# Patient Record
Sex: Female | Born: 1935 | Race: White | Hispanic: No | Smoking: Never smoker
Health system: Southern US, Community
[De-identification: ages and names within clinical notes are randomized; demographics above are authoritative.]

## PROBLEM LIST (undated history)

## (undated) DIAGNOSIS — Z66 Do not resuscitate: Secondary | ICD-10-CM

## (undated) DIAGNOSIS — R1310 Dysphagia, unspecified: Secondary | ICD-10-CM

## (undated) DIAGNOSIS — K219 Gastro-esophageal reflux disease without esophagitis: Secondary | ICD-10-CM

## (undated) DIAGNOSIS — R569 Unspecified convulsions: Secondary | ICD-10-CM

## (undated) DIAGNOSIS — G8929 Other chronic pain: Secondary | ICD-10-CM

## (undated) DIAGNOSIS — M810 Age-related osteoporosis without current pathological fracture: Secondary | ICD-10-CM

## (undated) DIAGNOSIS — Z22322 Carrier or suspected carrier of Methicillin resistant Staphylococcus aureus: Secondary | ICD-10-CM

## (undated) DIAGNOSIS — F32A Depression, unspecified: Secondary | ICD-10-CM

## (undated) DIAGNOSIS — I10 Essential (primary) hypertension: Secondary | ICD-10-CM

## (undated) DIAGNOSIS — J9690 Respiratory failure, unspecified, unspecified whether with hypoxia or hypercapnia: Secondary | ICD-10-CM

## (undated) DIAGNOSIS — J690 Pneumonitis due to inhalation of food and vomit: Secondary | ICD-10-CM

## (undated) DIAGNOSIS — Z8522 Personal history of malignant neoplasm of nasal cavities, middle ear, and accessory sinuses: Secondary | ICD-10-CM

## (undated) DIAGNOSIS — M4850XA Collapsed vertebra, not elsewhere classified, site unspecified, initial encounter for fracture: Secondary | ICD-10-CM

## (undated) DIAGNOSIS — E039 Hypothyroidism, unspecified: Secondary | ICD-10-CM

## (undated) DIAGNOSIS — F329 Major depressive disorder, single episode, unspecified: Secondary | ICD-10-CM

## (undated) HISTORY — PX: CHOLECYSTECTOMY: SHX55

## (undated) HISTORY — PX: APPENDECTOMY: SHX54

## (undated) HISTORY — DX: Gastro-esophageal reflux disease without esophagitis: K21.9

## (undated) HISTORY — PX: KYPHOPLASTY: SHX5884

---

## 1969-01-18 DIAGNOSIS — Z8522 Personal history of malignant neoplasm of nasal cavities, middle ear, and accessory sinuses: Secondary | ICD-10-CM

## 1969-01-18 HISTORY — DX: Personal history of malignant neoplasm of nasal cavities, middle ear, and accessory sinuses: Z85.22

## 1969-01-18 HISTORY — PX: PALATE SURGERY: SHX729

## 1969-01-18 HISTORY — PX: ENUCLEATION: SHX628

## 1997-12-26 ENCOUNTER — Ambulatory Visit (HOSPITAL_BASED_OUTPATIENT_CLINIC_OR_DEPARTMENT_OTHER): Admission: RE | Admit: 1997-12-26 | Discharge: 1997-12-26 | Payer: Self-pay

## 2001-02-04 ENCOUNTER — Ambulatory Visit (HOSPITAL_COMMUNITY): Admission: RE | Admit: 2001-02-04 | Discharge: 2001-02-04 | Payer: Self-pay | Admitting: Internal Medicine

## 2001-02-04 ENCOUNTER — Encounter: Payer: Self-pay | Admitting: Internal Medicine

## 2001-02-16 ENCOUNTER — Ambulatory Visit (HOSPITAL_COMMUNITY): Admission: RE | Admit: 2001-02-16 | Discharge: 2001-02-16 | Payer: Self-pay | Admitting: Internal Medicine

## 2001-04-13 ENCOUNTER — Ambulatory Visit (HOSPITAL_COMMUNITY): Admission: RE | Admit: 2001-04-13 | Discharge: 2001-04-14 | Payer: Self-pay | Admitting: Family Medicine

## 2001-04-15 ENCOUNTER — Encounter: Payer: Self-pay | Admitting: Family Medicine

## 2001-04-17 ENCOUNTER — Encounter: Payer: Self-pay | Admitting: Family Medicine

## 2001-04-17 ENCOUNTER — Ambulatory Visit (HOSPITAL_COMMUNITY): Admission: RE | Admit: 2001-04-17 | Discharge: 2001-04-17 | Payer: Self-pay | Admitting: *Deleted

## 2001-11-25 ENCOUNTER — Ambulatory Visit (HOSPITAL_COMMUNITY): Admission: RE | Admit: 2001-11-25 | Discharge: 2001-11-25 | Payer: Self-pay | Admitting: Otolaryngology

## 2001-11-25 ENCOUNTER — Encounter: Payer: Self-pay | Admitting: Otolaryngology

## 2002-01-22 ENCOUNTER — Encounter: Payer: Self-pay | Admitting: Family Medicine

## 2002-01-22 ENCOUNTER — Ambulatory Visit (HOSPITAL_COMMUNITY): Admission: RE | Admit: 2002-01-22 | Discharge: 2002-01-22 | Payer: Self-pay | Admitting: Family Medicine

## 2002-02-04 ENCOUNTER — Ambulatory Visit (HOSPITAL_COMMUNITY): Admission: RE | Admit: 2002-02-04 | Discharge: 2002-02-04 | Payer: Self-pay | Admitting: Family Medicine

## 2002-02-04 ENCOUNTER — Encounter: Payer: Self-pay | Admitting: Family Medicine

## 2002-04-27 ENCOUNTER — Encounter: Payer: Self-pay | Admitting: Family Medicine

## 2002-04-27 ENCOUNTER — Ambulatory Visit (HOSPITAL_COMMUNITY): Admission: RE | Admit: 2002-04-27 | Discharge: 2002-04-27 | Payer: Self-pay | Admitting: Family Medicine

## 2002-09-25 ENCOUNTER — Encounter: Payer: Self-pay | Admitting: *Deleted

## 2002-09-25 ENCOUNTER — Emergency Department (HOSPITAL_COMMUNITY): Admission: EM | Admit: 2002-09-25 | Discharge: 2002-09-25 | Payer: Self-pay | Admitting: *Deleted

## 2002-09-26 ENCOUNTER — Inpatient Hospital Stay (HOSPITAL_COMMUNITY): Admission: AD | Admit: 2002-09-26 | Discharge: 2002-10-07 | Payer: Self-pay | Admitting: Family Medicine

## 2002-09-26 ENCOUNTER — Encounter: Payer: Self-pay | Admitting: *Deleted

## 2002-09-26 ENCOUNTER — Emergency Department (HOSPITAL_COMMUNITY): Admission: EM | Admit: 2002-09-26 | Discharge: 2002-09-26 | Payer: Self-pay | Admitting: Emergency Medicine

## 2002-09-27 ENCOUNTER — Encounter: Payer: Self-pay | Admitting: Family Medicine

## 2002-09-29 ENCOUNTER — Encounter: Payer: Self-pay | Admitting: Neurosurgery

## 2002-10-01 ENCOUNTER — Encounter: Payer: Self-pay | Admitting: Neurosurgery

## 2002-10-07 ENCOUNTER — Inpatient Hospital Stay (HOSPITAL_COMMUNITY)
Admission: RE | Admit: 2002-10-07 | Discharge: 2002-10-18 | Payer: Self-pay | Admitting: Physical Medicine & Rehabilitation

## 2003-07-20 ENCOUNTER — Emergency Department (HOSPITAL_COMMUNITY): Admission: EM | Admit: 2003-07-20 | Discharge: 2003-07-21 | Payer: Self-pay | Admitting: Physical Therapy

## 2003-07-22 ENCOUNTER — Ambulatory Visit (HOSPITAL_COMMUNITY): Admission: RE | Admit: 2003-07-22 | Discharge: 2003-07-22 | Payer: Self-pay | Admitting: Neurosurgery

## 2003-07-28 ENCOUNTER — Inpatient Hospital Stay (HOSPITAL_COMMUNITY): Admission: AD | Admit: 2003-07-28 | Discharge: 2003-08-02 | Payer: Self-pay | Admitting: Neurosurgery

## 2003-08-01 ENCOUNTER — Encounter (INDEPENDENT_AMBULATORY_CARE_PROVIDER_SITE_OTHER): Payer: Self-pay | Admitting: Specialist

## 2003-08-02 ENCOUNTER — Inpatient Hospital Stay (HOSPITAL_COMMUNITY)
Admission: RE | Admit: 2003-08-02 | Discharge: 2003-08-16 | Payer: Self-pay | Admitting: Physical Medicine & Rehabilitation

## 2003-08-16 ENCOUNTER — Inpatient Hospital Stay: Admission: AD | Admit: 2003-08-16 | Discharge: 2003-09-10 | Payer: Self-pay | Admitting: Family Medicine

## 2003-10-31 ENCOUNTER — Inpatient Hospital Stay (HOSPITAL_COMMUNITY): Admission: AD | Admit: 2003-10-31 | Discharge: 2003-11-07 | Payer: Self-pay | Admitting: Neurosurgery

## 2003-11-01 ENCOUNTER — Encounter (INDEPENDENT_AMBULATORY_CARE_PROVIDER_SITE_OTHER): Payer: Self-pay | Admitting: Specialist

## 2003-11-07 ENCOUNTER — Inpatient Hospital Stay: Admission: AD | Admit: 2003-11-07 | Discharge: 2003-12-22 | Payer: Self-pay | Admitting: Pulmonary Disease

## 2003-11-15 ENCOUNTER — Ambulatory Visit (HOSPITAL_COMMUNITY): Admission: RE | Admit: 2003-11-15 | Discharge: 2003-11-15 | Payer: Self-pay | Admitting: Pulmonary Disease

## 2003-12-30 ENCOUNTER — Inpatient Hospital Stay (HOSPITAL_COMMUNITY): Admission: EM | Admit: 2003-12-30 | Discharge: 2004-01-02 | Payer: Self-pay | Admitting: Emergency Medicine

## 2004-01-02 ENCOUNTER — Inpatient Hospital Stay: Admission: AD | Admit: 2004-01-02 | Discharge: 2004-02-18 | Payer: Self-pay | Admitting: Pulmonary Disease

## 2004-02-18 ENCOUNTER — Inpatient Hospital Stay: Admission: AD | Admit: 2004-02-18 | Discharge: 2008-01-13 | Payer: Self-pay | Admitting: Pulmonary Disease

## 2005-07-17 ENCOUNTER — Ambulatory Visit (HOSPITAL_COMMUNITY): Admission: RE | Admit: 2005-07-17 | Discharge: 2005-07-17 | Payer: Self-pay | Admitting: Pulmonary Disease

## 2005-12-13 ENCOUNTER — Emergency Department (HOSPITAL_COMMUNITY): Admission: EM | Admit: 2005-12-13 | Discharge: 2005-12-13 | Payer: Self-pay | Admitting: Emergency Medicine

## 2006-02-18 ENCOUNTER — Ambulatory Visit (HOSPITAL_COMMUNITY): Admission: RE | Admit: 2006-02-18 | Discharge: 2006-02-18 | Payer: Self-pay | Admitting: Pulmonary Disease

## 2008-01-12 ENCOUNTER — Ambulatory Visit (HOSPITAL_COMMUNITY): Admission: RE | Admit: 2008-01-12 | Discharge: 2008-01-12 | Payer: Self-pay | Admitting: Internal Medicine

## 2008-01-13 ENCOUNTER — Inpatient Hospital Stay (HOSPITAL_COMMUNITY): Admission: AD | Admit: 2008-01-13 | Discharge: 2008-01-15 | Payer: Self-pay | Admitting: Pulmonary Disease

## 2008-01-19 ENCOUNTER — Inpatient Hospital Stay: Admission: RE | Admit: 2008-01-19 | Discharge: 2008-02-12 | Payer: Self-pay | Admitting: Internal Medicine

## 2008-02-09 ENCOUNTER — Ambulatory Visit (HOSPITAL_COMMUNITY): Admission: RE | Admit: 2008-02-09 | Discharge: 2008-02-09 | Payer: Self-pay | Admitting: Pulmonary Disease

## 2008-02-10 ENCOUNTER — Encounter (HOSPITAL_COMMUNITY): Admission: RE | Admit: 2008-02-10 | Discharge: 2008-02-15 | Payer: Self-pay | Admitting: Pulmonary Disease

## 2008-02-11 ENCOUNTER — Ambulatory Visit (HOSPITAL_COMMUNITY): Payer: Self-pay | Admitting: Pulmonary Disease

## 2008-02-12 ENCOUNTER — Inpatient Hospital Stay (HOSPITAL_COMMUNITY): Admission: AD | Admit: 2008-02-12 | Discharge: 2008-02-13 | Payer: Self-pay | Admitting: Pulmonary Disease

## 2008-02-15 ENCOUNTER — Inpatient Hospital Stay: Admission: RE | Admit: 2008-02-15 | Discharge: 2008-10-17 | Payer: Self-pay | Admitting: Pulmonary Disease

## 2008-04-25 ENCOUNTER — Ambulatory Visit (HOSPITAL_COMMUNITY): Admission: RE | Admit: 2008-04-25 | Discharge: 2008-04-25 | Payer: Self-pay | Admitting: Pulmonary Disease

## 2008-09-02 ENCOUNTER — Ambulatory Visit (HOSPITAL_COMMUNITY): Admission: RE | Admit: 2008-09-02 | Discharge: 2008-09-02 | Payer: Self-pay | Admitting: Pulmonary Disease

## 2008-10-18 ENCOUNTER — Inpatient Hospital Stay (HOSPITAL_COMMUNITY): Admission: EM | Admit: 2008-10-18 | Discharge: 2008-10-21 | Payer: Self-pay | Admitting: Emergency Medicine

## 2008-10-21 ENCOUNTER — Encounter: Payer: Self-pay | Admitting: Orthopedic Surgery

## 2008-11-04 ENCOUNTER — Ambulatory Visit (HOSPITAL_COMMUNITY): Admission: RE | Admit: 2008-11-04 | Discharge: 2008-11-04 | Payer: Self-pay | Admitting: Pulmonary Disease

## 2008-11-11 ENCOUNTER — Ambulatory Visit (HOSPITAL_COMMUNITY): Admission: RE | Admit: 2008-11-11 | Discharge: 2008-11-11 | Payer: Self-pay | Admitting: Pulmonary Disease

## 2009-06-22 ENCOUNTER — Emergency Department (HOSPITAL_COMMUNITY): Admission: EM | Admit: 2009-06-22 | Discharge: 2009-06-22 | Payer: Self-pay | Admitting: Emergency Medicine

## 2010-03-13 ENCOUNTER — Inpatient Hospital Stay (HOSPITAL_COMMUNITY)
Admission: EM | Admit: 2010-03-13 | Discharge: 2010-03-21 | Payer: Self-pay | Source: Home / Self Care | Admitting: Emergency Medicine

## 2010-03-21 ENCOUNTER — Inpatient Hospital Stay: Admission: AD | Admit: 2010-03-21 | Discharge: 2010-03-24 | Payer: Self-pay | Admitting: Pulmonary Disease

## 2010-03-24 ENCOUNTER — Inpatient Hospital Stay (HOSPITAL_COMMUNITY)
Admission: EM | Admit: 2010-03-24 | Discharge: 2010-03-29 | Payer: Self-pay | Source: Home / Self Care | Admitting: Emergency Medicine

## 2010-03-29 ENCOUNTER — Inpatient Hospital Stay
Admission: AD | Admit: 2010-03-29 | Discharge: 2012-07-16 | Disposition: A | Discharge status: Nursing Home (Includes ICF) | Payer: Medicare Other | Source: Home / Self Care | Attending: Pulmonary Disease | Admitting: Pulmonary Disease

## 2010-03-29 DIAGNOSIS — R531 Weakness: Secondary | ICD-10-CM

## 2010-06-11 ENCOUNTER — Encounter: Payer: Self-pay | Admitting: Pulmonary Disease

## 2010-06-22 ENCOUNTER — Inpatient Hospital Stay (HOSPITAL_COMMUNITY)
Admit: 2010-06-22 | Discharge: 2010-06-22 | DRG: 556 | Disposition: A | Discharge status: Home / Self Care | Payer: Medicare Other | Source: Skilled Nursing Facility | Attending: Pulmonary Disease | Admitting: Pulmonary Disease

## 2010-06-22 DIAGNOSIS — M79609 Pain in unspecified limb: Secondary | ICD-10-CM | POA: Diagnosis present

## 2010-07-09 ENCOUNTER — Ambulatory Visit (HOSPITAL_COMMUNITY): Payer: Medicare Other | Attending: Pulmonary Disease

## 2010-07-09 DIAGNOSIS — I6350 Cerebral infarction due to unspecified occlusion or stenosis of unspecified cerebral artery: Secondary | ICD-10-CM | POA: Insufficient documentation

## 2010-07-09 DIAGNOSIS — G319 Degenerative disease of nervous system, unspecified: Secondary | ICD-10-CM | POA: Insufficient documentation

## 2010-07-17 NOTE — Progress Notes (Signed)
  NAME:  [name], [name]               ACCOUNT NO.:  [id number]  MEDICAL RECORD NO.:  [id number]           PATIENT TYPE:  O  LOCATION:  XRAY                          FACILITY:  [facility]  PHYSICIAN:  Brissia Delisa L. Juanetta Gosling, M.D.DATE OF BIRTH:  Sep 29, 1922  DATE OF PROCEDURE: DATE OF DISCHARGE:  06/22/2010                                PROGRESS NOTE   [name] is a resident at the [facility].  She has got new problems. First, she has several skin lesions on her left leg and today, she is complaining of being nauseated, vomiting, and diarrhea.  There has been a significant outbreak of norovirus in the community.  She has no other new complaints.  She is in the [facility] with osteoporosis, depression, history of cancer in her eye, and failure to thrive.  She is improved.  PHYSICAL EXAMINATION:  SKIN:  Her exam shows she has some skin lesions on her left leg that may be infected.  It is difficult to be 100% certain. CHEST:  Clear. HEART:  Regular. ABDOMEN:  Soft.  ASSESSMENT:  She has severe osteoporosis with multiple fractures.  She had problems with anxiety and depression.  She has what I think is a mild dementia.  She has skin lesions, which were a problem.  She has a new problem which is a viral illness and my plan will be for her to be on Phenergan as needed, Imodium as needed, clear liquid diet as needed. She is going to continue with everything else.  I am going to add Bactroban cream to her leg.     [name] L. Juanetta Gosling, M.D.     ELH/MEDQ  D:  06/25/2010  T:  06/26/2010  Job:  [id number]  Electronically Signed by Kari Baars M.D. on 07/17/2010 06:09:16 PM

## 2010-07-31 LAB — BASIC METABOLIC PANEL
BUN: 5 mg/dL — ABNORMAL LOW (ref 6–23)
CO2: 32 mEq/L (ref 19–32)
Calcium: 7.7 mg/dL — ABNORMAL LOW (ref 8.4–10.5)
Calcium: 8.4 mg/dL (ref 8.4–10.5)
Chloride: 106 mEq/L (ref 96–112)
Creatinine, Ser: 1.2 mg/dL (ref 0.4–1.2)
GFR calc Af Amer: 60 mL/min — ABNORMAL LOW (ref >60)
GFR calc non Af Amer: 49 mL/min — ABNORMAL LOW (ref >60)
Glucose, Bld: 71 mg/dL (ref 70–99)
Glucose, Bld: 94 mg/dL (ref 70–99)
Potassium: 4.3 mEq/L (ref 3.5–5.1)
Sodium: 141 mEq/L (ref 135–145)
Sodium: 141 mEq/L (ref 135–145)
Sodium: 143 mEq/L (ref 135–145)

## 2010-07-31 LAB — COMPREHENSIVE METABOLIC PANEL
Alkaline Phosphatase: 85 U/L (ref 39–117)
BUN: 17 mg/dL (ref 6–23)
CO2: 37 mEq/L — ABNORMAL HIGH (ref 19–32)
Chloride: 96 mEq/L (ref 96–112)
Glucose, Bld: 152 mg/dL — ABNORMAL HIGH (ref 70–99)
Potassium: 3.2 mEq/L — ABNORMAL LOW (ref 3.5–5.1)
Total Bilirubin: 0.6 mg/dL (ref 0.3–1.2)

## 2010-07-31 LAB — URINALYSIS, ROUTINE W REFLEX MICROSCOPIC
Bilirubin Urine: NEGATIVE
Protein, ur: NEGATIVE mg/dL
Urobilinogen, UA: 0.2 mg/dL (ref 0.0–1.0)

## 2010-07-31 LAB — DIFFERENTIAL
Basophils Absolute: 0.1 10*3/uL (ref 0.0–0.1)
Basophils Relative: 0 % (ref 0–1)
Neutro Abs: 14.8 10*3/uL — ABNORMAL HIGH (ref 1.7–7.7)
Neutrophils Relative %: 72 % (ref 43–77)

## 2010-07-31 LAB — URINE MICROSCOPIC-ADD ON

## 2010-07-31 LAB — CBC
HCT: 34.4 % — ABNORMAL LOW (ref 36.0–46.0)
MCV: 88.6 fL (ref 78.0–100.0)
RBC: 3.88 MIL/uL (ref 3.87–5.11)
WBC: 20.6 10*3/uL — ABNORMAL HIGH (ref 4.0–10.5)

## 2010-07-31 LAB — POCT CARDIAC MARKERS
CKMB, poc: <1 ng/mL — ABNORMAL LOW (ref 1.0–8.0)
Myoglobin, poc: 217 ng/mL (ref 12–200)

## 2010-07-31 LAB — VANCOMYCIN, RANDOM
Vancomycin Rm: 13.8 ug/mL
Vancomycin Rm: 30.3 ug/mL

## 2010-08-01 LAB — BASIC METABOLIC PANEL
BUN: 25 mg/dL — ABNORMAL HIGH (ref 6–23)
CO2: 29 mEq/L (ref 19–32)
CO2: 31 mEq/L (ref 19–32)
Calcium: 8 mg/dL — ABNORMAL LOW (ref 8.4–10.5)
Calcium: 8.1 mg/dL — ABNORMAL LOW (ref 8.4–10.5)
Calcium: 8.5 mg/dL (ref 8.4–10.5)
Chloride: 100 mEq/L (ref 96–112)
Chloride: 107 mEq/L (ref 96–112)
Chloride: 107 mEq/L (ref 96–112)
Creatinine, Ser: 0.86 mg/dL (ref 0.4–1.2)
Creatinine, Ser: 0.91 mg/dL (ref 0.4–1.2)
Creatinine, Ser: 0.96 mg/dL (ref 0.4–1.2)
Creatinine, Ser: 1.25 mg/dL — ABNORMAL HIGH (ref 0.4–1.2)
Creatinine, Ser: 1.5 mg/dL — ABNORMAL HIGH (ref 0.4–1.2)
GFR calc Af Amer: 40 mL/min — ABNORMAL LOW (ref >60)
GFR calc Af Amer: 49 mL/min — ABNORMAL LOW (ref >60)
GFR calc Af Amer: >60 mL/min (ref >60)
GFR calc Af Amer: >60 mL/min (ref >60)
GFR calc non Af Amer: 41 mL/min — ABNORMAL LOW (ref >60)
Glucose, Bld: 82 mg/dL (ref 70–99)
Potassium: 3.5 mEq/L (ref 3.5–5.1)
Sodium: 139 mEq/L (ref 135–145)
Sodium: 141 mEq/L (ref 135–145)
Sodium: 141 mEq/L (ref 135–145)

## 2010-08-01 LAB — CBC
HCT: 37 % (ref 36.0–46.0)
Hemoglobin: 10.4 g/dL — ABNORMAL LOW (ref 12.0–15.0)
Hemoglobin: 9.1 g/dL — ABNORMAL LOW (ref 12.0–15.0)
Hemoglobin: 9.6 g/dL — ABNORMAL LOW (ref 12.0–15.0)
MCH: 29.3 pg (ref 26.0–34.0)
MCH: 29.5 pg (ref 26.0–34.0)
MCH: 29.6 pg (ref 26.0–34.0)
MCV: 88.2 fL (ref 78.0–100.0)
MCV: 88.9 fL (ref 78.0–100.0)
MCV: 89.1 fL (ref 78.0–100.0)
MCV: 89.7 fL (ref 78.0–100.0)
Platelets: 103 10*3/uL — ABNORMAL LOW (ref 150–400)
Platelets: 113 10*3/uL — ABNORMAL LOW (ref 150–400)
Platelets: 136 10*3/uL — ABNORMAL LOW (ref 150–400)
Platelets: 210 10*3/uL (ref 150–400)
RBC: 3.09 MIL/uL — ABNORMAL LOW (ref 3.87–5.11)
RBC: 3.23 MIL/uL — ABNORMAL LOW (ref 3.87–5.11)
RBC: 3.49 MIL/uL — ABNORMAL LOW (ref 3.87–5.11)
RBC: 3.64 MIL/uL — ABNORMAL LOW (ref 3.87–5.11)
RBC: 4.16 MIL/uL (ref 3.87–5.11)
RDW: 12.9 % (ref 11.5–15.5)
WBC: 10.9 10*3/uL — ABNORMAL HIGH (ref 4.0–10.5)
WBC: 17.5 10*3/uL — ABNORMAL HIGH (ref 4.0–10.5)
WBC: 8.9 10*3/uL (ref 4.0–10.5)

## 2010-08-01 LAB — COMPREHENSIVE METABOLIC PANEL
ALT: 15 U/L (ref 0–35)
AST: 30 U/L (ref 0–37)
Albumin: 3.7 g/dL (ref 3.5–5.2)
Alkaline Phosphatase: 89 U/L (ref 39–117)
CO2: 33 mEq/L — ABNORMAL HIGH (ref 19–32)
Chloride: 102 mEq/L (ref 96–112)
Creatinine, Ser: 1.14 mg/dL (ref 0.4–1.2)
GFR calc Af Amer: 55 mL/min — ABNORMAL LOW (ref >60)
GFR calc non Af Amer: 45 mL/min — ABNORMAL LOW (ref >60)
Potassium: 4.2 mEq/L (ref 3.5–5.1)
Sodium: 140 mEq/L (ref 135–145)
Total Bilirubin: 0.1 mg/dL — ABNORMAL LOW (ref 0.3–1.2)

## 2010-08-01 LAB — DIFFERENTIAL
Basophils Absolute: 0 10*3/uL (ref 0.0–0.1)
Basophils Absolute: 0.1 10*3/uL (ref 0.0–0.1)
Basophils Relative: 1 % (ref 0–1)
Eosinophils Absolute: 0 10*3/uL (ref 0.0–0.7)
Eosinophils Absolute: 0.1 10*3/uL (ref 0.0–0.7)
Eosinophils Absolute: 0.3 10*3/uL (ref 0.0–0.7)
Eosinophils Absolute: 0.6 10*3/uL (ref 0.0–0.7)
Eosinophils Relative: 0 % (ref 0–5)
Eosinophils Relative: 2 % (ref 0–5)
Eosinophils Relative: 4 % (ref 0–5)
Eosinophils Relative: 5 % (ref 0–5)
Lymphocytes Relative: 5 % — ABNORMAL LOW (ref 12–46)
Lymphocytes Relative: 7 % — ABNORMAL LOW (ref 12–46)
Lymphs Abs: 0.5 10*3/uL — ABNORMAL LOW (ref 0.7–4.0)
Lymphs Abs: 0.6 10*3/uL — ABNORMAL LOW (ref 0.7–4.0)
Lymphs Abs: 0.8 10*3/uL (ref 0.7–4.0)
Lymphs Abs: 0.8 10*3/uL (ref 0.7–4.0)
Monocytes Absolute: 0.4 10*3/uL (ref 0.1–1.0)
Monocytes Absolute: 0.4 10*3/uL (ref 0.1–1.0)
Monocytes Relative: 5 % (ref 3–12)
Monocytes Relative: 5 % (ref 3–12)
Monocytes Relative: 5 % (ref 3–12)
Neutro Abs: 9.5 10*3/uL — ABNORMAL HIGH (ref 1.7–7.7)
Neutrophils Relative %: 87 % — ABNORMAL HIGH (ref 43–77)
Neutrophils Relative %: 93 % — ABNORMAL HIGH (ref 43–77)

## 2010-08-01 LAB — TROPONIN I: Troponin I: 0.02 ng/mL (ref 0.00–0.06)

## 2010-08-01 LAB — GLUCOSE, CAPILLARY: Glucose-Capillary: 229 mg/dL — ABNORMAL HIGH (ref 70–99)

## 2010-08-01 LAB — CULTURE, BLOOD (ROUTINE X 2)

## 2010-08-01 LAB — VANCOMYCIN, TROUGH: Vancomycin Tr: 12.7 ug/mL (ref 10.0–20.0)

## 2010-08-08 LAB — BASIC METABOLIC PANEL
BUN: 27 mg/dL — ABNORMAL HIGH (ref 6–23)
Calcium: 9.2 mg/dL (ref 8.4–10.5)
GFR calc non Af Amer: 36 mL/min — ABNORMAL LOW (ref >60)
Glucose, Bld: 119 mg/dL — ABNORMAL HIGH (ref 70–99)
Potassium: 4.8 mEq/L (ref 3.5–5.1)

## 2010-08-08 LAB — DIFFERENTIAL
Eosinophils Relative: 7 % — ABNORMAL HIGH (ref 0–5)
Lymphs Abs: 1.2 10*3/uL (ref 0.7–4.0)
Monocytes Relative: 6 % (ref 3–12)
Neutro Abs: 3.8 10*3/uL (ref 1.7–7.7)

## 2010-08-08 LAB — PROTIME-INR: Prothrombin Time: 13.9 seconds (ref 11.6–15.2)

## 2010-08-08 LAB — CBC
HCT: 34.2 % — ABNORMAL LOW (ref 36.0–46.0)
Platelets: 190 10*3/uL (ref 150–400)
RDW: 13.8 % (ref 11.5–15.5)

## 2010-08-09 NOTE — Progress Notes (Signed)
  NAME:  [name], Kaiser NO.:  [id number]  MEDICAL RECORD NO.:  [id number]           PATIENT TYPE:  LOCATION:                                 FACILITY:  PHYSICIAN:  Creasie Lacosse L. Juanetta Gosling, M.D.DATE OF BIRTH:  12-Jul-1922  DATE OF PROCEDURE:  08/04/2010 DATE OF DISCHARGE:                                PROGRESS NOTE   HISTORY OF PRESENT ILLNESS:  [name] is about the same.  She is at the [facility].  She has no new complaints.  Her thyroid was somewhat low, so I have changed her thyroid replacement.  She has no other new complaints.  She has had multiple fractures.  She has had anxiety and depression.  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  She has had some anemia and has had episodes of what I think are probably seizures.  PHYSICAL EXAMINATION:  GENERAL:  She is awake and alert.  She looks pretty comfortable. CHEST:  Clear. HEART:  Regular.  ASSESSMENT:  She is better I think.  PLAN:  To continue with her current treatments.  Replace her thyroid. No other new meds and follow.     [name] L. Juanetta Gosling, M.D.     ELH/MEDQ  D:  08/05/2010  T:  08/05/2010  Job:  [id number]  Electronically Signed by Kari Baars M.D. on 08/09/2010 09:10:57 AM

## 2010-08-27 LAB — CROSSMATCH: Antibody Screen: NEGATIVE

## 2010-08-27 LAB — CBC
HCT: 20.2 % — ABNORMAL LOW (ref 36.0–46.0)
HCT: 30 % — ABNORMAL LOW (ref 36.0–46.0)
Hemoglobin: 10.1 g/dL — ABNORMAL LOW (ref 12.0–15.0)
Hemoglobin: 10.1 g/dL — ABNORMAL LOW (ref 12.0–15.0)
Hemoglobin: 6.9 g/dL — CL (ref 12.0–15.0)
MCHC: 34 g/dL (ref 30.0–36.0)
MCV: 90.4 fL (ref 78.0–100.0)
Platelets: 88 10*3/uL — ABNORMAL LOW (ref 150–400)
RBC: 2.23 MIL/uL — ABNORMAL LOW (ref 3.87–5.11)
RBC: 3.2 MIL/uL — ABNORMAL LOW (ref 3.87–5.11)
WBC: 10.5 10*3/uL (ref 4.0–10.5)
WBC: 8.2 10*3/uL (ref 4.0–10.5)
WBC: 8.5 10*3/uL (ref 4.0–10.5)

## 2010-08-27 LAB — BASIC METABOLIC PANEL
BUN: 24 mg/dL — ABNORMAL HIGH (ref 6–23)
CO2: 23 mEq/L (ref 19–32)
Calcium: 7.8 mg/dL — ABNORMAL LOW (ref 8.4–10.5)
Calcium: 8 mg/dL — ABNORMAL LOW (ref 8.4–10.5)
Chloride: 106 mEq/L (ref 96–112)
Chloride: 109 mEq/L (ref 96–112)
GFR calc Af Amer: 31 mL/min — ABNORMAL LOW (ref >60)
GFR calc Af Amer: 50 mL/min — ABNORMAL LOW (ref >60)
GFR calc non Af Amer: 35 mL/min — ABNORMAL LOW (ref >60)
Potassium: 4.7 mEq/L (ref 3.5–5.1)
Potassium: 5.5 mEq/L — ABNORMAL HIGH (ref 3.5–5.1)
Sodium: 136 mEq/L (ref 135–145)
Sodium: 138 mEq/L (ref 135–145)
Sodium: 140 mEq/L (ref 135–145)

## 2010-08-27 LAB — PROTIME-INR
INR: 1.3 (ref 0.00–1.49)
INR: 2.2 — ABNORMAL HIGH (ref 0.00–1.49)
INR: 2.3 — ABNORMAL HIGH (ref 0.00–1.49)
Prothrombin Time: 26.1 seconds — ABNORMAL HIGH (ref 11.6–15.2)

## 2010-08-27 LAB — HEMOGLOBIN AND HEMATOCRIT, BLOOD: HCT: 27.2 % — ABNORMAL LOW (ref 36.0–46.0)

## 2010-08-27 LAB — PREPARE RBC (CROSSMATCH)

## 2010-08-28 LAB — POCT CARDIAC MARKERS
Myoglobin, poc: 87.3 ng/mL (ref 12–200)
Troponin i, poc: <0.05 ng/mL (ref 0.00–0.09)

## 2010-08-28 LAB — URINE MICROSCOPIC-ADD ON

## 2010-08-28 LAB — URINALYSIS, ROUTINE W REFLEX MICROSCOPIC
Bilirubin Urine: NEGATIVE
Glucose, UA: NEGATIVE mg/dL
Ketones, ur: NEGATIVE mg/dL
Leukocytes, UA: NEGATIVE
Protein, ur: NEGATIVE mg/dL
pH: 5.5 (ref 5.0–8.0)

## 2010-08-28 LAB — CBC
HCT: 31.9 % — ABNORMAL LOW (ref 36.0–46.0)
Hemoglobin: 11.1 g/dL — ABNORMAL LOW (ref 12.0–15.0)
MCV: 90.4 fL (ref 78.0–100.0)
Platelets: 157 10*3/uL (ref 150–400)
RBC: 3.53 MIL/uL — ABNORMAL LOW (ref 3.87–5.11)
WBC: 7.7 10*3/uL (ref 4.0–10.5)

## 2010-08-28 LAB — DIFFERENTIAL
Eosinophils Absolute: 0.2 10*3/uL (ref 0.0–0.7)
Eosinophils Relative: 2 % (ref 0–5)
Lymphocytes Relative: 18 % (ref 12–46)
Lymphs Abs: 1.4 10*3/uL (ref 0.7–4.0)
Monocytes Absolute: 0.4 10*3/uL (ref 0.1–1.0)
Monocytes Relative: 6 % (ref 3–12)

## 2010-08-28 LAB — BASIC METABOLIC PANEL
BUN: 33 mg/dL — ABNORMAL HIGH (ref 6–23)
Chloride: 108 mEq/L (ref 96–112)
GFR calc non Af Amer: 37 mL/min — ABNORMAL LOW (ref >60)
Potassium: 4.5 mEq/L (ref 3.5–5.1)
Sodium: 140 mEq/L (ref 135–145)

## 2010-09-16 NOTE — Group Therapy Note (Signed)
  NAME:  PEPPER, KERRICK               ACCOUNT NO.:  [id number]  MEDICAL RECORD NO.:  [id number]           PATIENT TYPE:  I  LOCATION:  S126                          FACILITY:  [facility]  PHYSICIAN:  Markita Stcharles L. Juanetta Gosling, M.D.DATE OF BIRTH:  1923/02/01  DATE OF PROCEDURE: DATE OF DISCHARGE:                                PROGRESS NOTE   Ms. Costanza is having some more problems.  She has had some cough and congestion.  She is on oxygen.  She is trying to keep herself off of it. Her hemoglobin level is a little bit lower.  Her BMET looks okay.  Her hemoglobin level is about 8.2 and I had her get a anemia profile which showed that she did have some iron deficiency.  We are working that up.  She is going to have stools for blood.  She in addition to that of course she has had multiple bouts of upper respiratory infection.  She has had two episodes that I think are seizures.  She has severe osteoporosis, status post multiple fractures and she has had a enucleation of her left eye because of the tumor in her sinus.  She looks fairly comfortable.  She still looks a little short of breath.  She is wearing her oxygen.  Her heart is regular.  Her abdomen is soft.  ASSESSMENT:  I think she is about the same.  PLAN:  Continue with her current treatments and medications.  No changes today.  We are working up the anemia and will follow from there.     Janesha Brissette L. Juanetta Gosling, M.D.     ELH/MEDQ  D:  08/28/2010  T:  08/28/2010  Job:  [id number]  Electronically Signed by Kari Baars M.D. on 09/16/2010 02:47:52 PM

## 2010-10-02 NOTE — Discharge Summary (Signed)
NAME:  [name], [name]               ACCOUNT NO.:  [id number]   MEDICAL RECORD NO.:  [id number]          PATIENT TYPE:  INP   LOCATION:  A314                          FACILITY:  [facility]   PHYSICIAN:  Edward L. Juanetta Gosling, M.D.DATE OF BIRTH:  21-Jul-1922   DATE OF ADMISSION:  01/13/2008  DATE OF DISCHARGE:  08/28/2009LH                               DISCHARGE SUMMARY   Being transferred to the [facility] today.   FINAL DISCHARGE DIAGNOSES:  1. Hyponatremia.  2. Dehydration.  3. Hypertension.  4. Gastroesophageal reflux disease.  5. 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  6. History of carcinoma of the nasal sinus.  7. History of enucleation of the left eye.  8. Hypothyroidism.  9. Osteoporosis, status post multiple fractures.  10.Anemia, probably of chronic disease.   HISTORY:  [name] is an 77 year old who lives at the [facility].  She  had been doing pretty well.  She had been diagnosed with an MRSA  infection in the sinus about a month ago.  She had been placed on  Bactrim DS by Dr. Lazarus Salines, the ear, nose and throat surgeon who had made  the diagnosis, but she did not tolerate that well.  She had increasing  weakness and she was being switched over to clindamycin but she  continued to be weak.  I had ordered a chest x-ray which did not show  any acute changes, a BMET that showed her sodium was 116, potassium 5.9,  and a CBC that showed she was mildly anemic.  I went to see her at the  [facility] and discussed all her findings with her, I was a bit  concerned that her lab work might not be totally accurate because of  some concern about hemolysis.  I did give her Kayexalate, restricted her  fluids, her sodium came up to 119.  [name] was quite awake and alert  when I saw and did not want to be admitted to the hospital, but the next  morning she agreed that some fluids might help and she wanted to be  admitted.   PHYSICAL EXAMINATION ON ADMISSION:   Showed a well-developed, well-  nourished female in no acute distress.  Her right pupil was reactive.  Left eye was missing.  She had a facial bone prosthesis.  Mucous  membranes are dry.  CHEST:  Clear.  HEART:  Regular.  ABDOMEN:  Soft.  EXTREMITIES:  No edema.  CNS:  Grossly intact.   HOSPITAL COURSE:  She was treated with intravenous fluids, vancomycin.  Her oral fluids were restricted and by the time of discharge she was  found to have a sodium of 129, she felt better, looked better, her BUN  and creatinine had returned to baseline for her so her dehydration had  resolved and she wanted to be returned to the [facility].  I am going  to send her back to the [facility] on oral fluid restriction of 800 mL  per day, on Actonel 35 mg weekly, Colace 100 mg b.i.d., Prilosec 20 mg  daily, metoprolol 25 mg daily, Tylenol  1000 mg p.o. q.4h. p.r.n. pain or  fever, Ativan 0.5 mg at bedtime and q.4h. p.r.n. anxiety, Vistaril 25 mg  p.o. nightly and q.6h. p.r.n. anxiety, Nu-Iron 150 mg daily, and  clindamycin 300 mg p.o. q.6h. x14 days.  She will need to have a CBC and  a BMET done on September 2nd.  She will be on a regular __________and  she will be followed at the [facility].      Edward L. Juanetta Gosling, M.D.  Electronically Signed     ELH/MEDQ  D:  01/15/2008  T:  01/15/2008  Job:  [id number]

## 2010-10-02 NOTE — Group Therapy Note (Signed)
NAME:  [name], [name]               ACCOUNT NO.:  [id number]   MEDICAL RECORD NO.:  [id number]         PATIENT TYPE:  PORB   LOCATION:  S126                          FACILITY:  [facility]   PHYSICIAN:  Edward L. Juanetta Gosling, M.D.DATE OF BIRTH:  09-03-1922   DATE OF PROCEDURE:  12/25/2008  DATE OF DISCHARGE:                                 PROGRESS NOTE   Problems including osteoporosis, depression, status post multiple  fractures, chronic infection, history of a carcinoma of the sinus area  requiring enucleation of her left eye.   SUBJECTIVE:  Ms. Cleek says she is doing well and had no complaints.  She is feeling okay, but she has had a couple of episodes where she slid  out of her chair.  She had lab work recently which is okay.   Her exam shows that she is awake and alert.  Chest is clear.  Heart is  regular.  Abdomen is soft.  Extremities showed no edema.   Assessment is that she got multiple medical problems that seems to be  stable.   PLAN:  No changes in her treatments and I will continue to follow her at  the nursing home.      Edward L. Juanetta Gosling, M.D.  Electronically Signed     ELH/MEDQ  D:  12/25/2008  T:  12/26/2008  Job:  [id number]

## 2010-10-02 NOTE — Discharge Summary (Signed)
NAME:  [name], [name]               ACCOUNT NO.:  [id number]   MEDICAL RECORD NO.:  [id number]          PATIENT TYPE:  INP   LOCATION:  A314                          FACILITY:  [facility]   PHYSICIAN:  Edward L. Juanetta Gosling, M.D.DATE OF BIRTH:  October 31, 1922   DATE OF ADMISSION:  01/13/2008  DATE OF DISCHARGE:  LH                               DISCHARGE SUMMARY   ADDENDUM:  I forgot to put on her discharge diagnoses a diagnosis of depression, a  diagnosis of hypothyroidism, diagnosis of allergies.  Other medications  that I did not dictate; Claritin 10 mg daily, Synthroid 150 mcg daily,  Lexapro 10 mg daily, Os-Cal 500 Plus D three times a day.      Edward L. Juanetta Gosling, M.D.  Electronically Signed     ELH/MEDQ  D:  01/15/2008  T:  01/15/2008  Job:  [id number]

## 2010-10-02 NOTE — Group Therapy Note (Signed)
NAME:  ESHANI, MAESTRE NO.:  [id number]   MEDICAL RECORD NO.:  [id number]          PATIENT TYPE:  ORB   LOCATION:  S126                          FACILITY:  [facility]   PHYSICIAN:  Edward L. Juanetta Gosling, M.D.DATE OF BIRTH:  1923/04/28   DATE OF PROCEDURE:  DATE OF DISCHARGE:                                 PROGRESS NOTE   Ms. Kanaan is doing better.  She has been hospitalized with a fractured  hip, but she seems to have made a good recovery.  She has been on  Coumadin with her Coumadin is scheduled to stop now, and I think, she  seems to be doing okay.  She also has multiple other problems including  a history of cancer which ended up causing her to have enucleation of  her left eye.  She has had MRSA infection of the sinuses.  She has had  bouts of complaints of hyponatremia and anemia, but overall I think she  is better.  She does seem to be more comfortable and she says she feels  better with no new complaints.  I do not plan to change any of her  treatments today.      Edward L. Juanetta Gosling, M.D.  Electronically Signed     ELH/MEDQ  D:  11/21/2008  T:  11/22/2008  Job:  [id number]

## 2010-10-02 NOTE — Group Therapy Note (Signed)
NAME:  QUINCIE, HAROON NO.:  [id number]   MEDICAL RECORD NO.:  [id number]          PATIENT TYPE:  ORB   LOCATION:  S126                          FACILITY:  [facility]   PHYSICIAN:  Edward L. Juanetta Gosling, M.D.DATE OF BIRTH:  11-29-1922   DATE OF PROCEDURE:  01/02/2008  DATE OF DISCHARGE:                                 PROGRESS NOTE   Ms. marieli rudy is doing a bit better.  She says that she feels  better.  She was discovered to have a MRSA infection in her nasal cavity  by Dr. Lazarus Salines, the ENT physician and she is being treated for that.  She says that she does feel somewhat better since she has been started  on this treatment.  Otherwise, she is about the same.  She has no other  new complaints and no other new problems.  Her depression is doing well.  She has no other new problems or complaints.   Her physical examination, she, of course, has an enucleation of her left  eye.  Her nostril appears better.  Her chest is clear.  Her heart is  regular.  She overall appears to be feeling better.   ASSESSMENT:  She is I think improved.  She does have an methicillin-  resistant Staphylococcus aureus infection in her nose that is being  treated.  She has chronic sinus congestion.  She has had an enucleation  of the eye on the left.  She has moderately severe depression, which is  doing okay.  She has chronic pain, on a Duragesic patch and she has  severe osteoporosis.  She has not had any fractures in a period of  greater than two years now, however.      Edward L. Juanetta Gosling, M.D.  Electronically Signed     ELH/MEDQ  D:  01/03/2008  T:  01/03/2008  Job:  [id number]

## 2010-10-02 NOTE — Group Therapy Note (Signed)
NAME:  [name], [name]               ACCOUNT NO.:  [id number]   MEDICAL RECORD NO.:  [id number]          PATIENT TYPE:  INP   LOCATION:  A318                          FACILITY:  [facility]   PHYSICIAN:  Edward L. Juanetta Gosling, M.D.DATE OF BIRTH:  [birth year]   DATE OF PROCEDURE:  02/13/2008  DATE OF DISCHARGE:  02/13/2008                                 PROGRESS NOTE   [name] has much improved and surprisingly to me had essentially no  abnormalities in her lab work.  She did have some white blood cells in  her urine and we are awaiting the culture.  She had a sodium that was  133 with normal 134, her hemoglobin was greater than 11, white blood  count was normal, she has been afebrile, and her vital signs have been  stable.  So I want to plan to send her back to the [facility].  I have  not found a cause for her to have her symptoms.  I will go ahead and  discontinue Namenda, perhaps her confusion is related to that.      Edward L. Juanetta Gosling, M.D.  Electronically Signed     ELH/MEDQ  D:  02/13/2008  T:  02/13/2008  Job:  [id number]

## 2010-10-02 NOTE — Group Therapy Note (Signed)
NAME:  [name], [name] NO.:  [id number]   MEDICAL RECORD NO.:  [id number]          PATIENT TYPE:  ORB   LOCATION:  S126                          FACILITY:  [facility]   PHYSICIAN:  Edward L. Juanetta Gosling, M.D.DATE OF BIRTH:  1923/04/13   DATE OF PROCEDURE:  DATE OF DISCHARGE:                                 PROGRESS NOTE   Ms. Sporrer continues to have more problems.  She actually fell some  several days ago and states she is aching all over.  She had lab work  done yesterday and that showed that her hemoglobin level then was about  8.6, but this morning, it is up in the 9 range.  She has received some  IV fluids.  She has a rash on her abdomen.  I am not sure if some of her  problem has been related to taking the medications for her  Staphylococcal infection, but her platelet count is also down some.  She  has not shown any signs of any sort of bleeding at this point.  We are  still checking stools for blood and we have not been able to demonstrate  any sort of GI bleeding in the past.   Her exam today shows that she looks more uncomfortable, and I have  discussed all this with her at length.  She states she does not want to  go to the hospital.  She wants to stay at the nursing home.  She is on  IV fluids now.  Her hemoglobin levels are little bit better.   ASSESSMENT:  Then, she has anemia.  It is still not totally clear what  has happened to that.  She has also had a problem with her platelets  that are low, that has not been a problem in the past.  She has had  hyponatremia in the past.  Her hyponatremia will be rechecked in the  morning as well.  She has a rash on her abdomen.  I am going to put some  Mycostatin cream on that and this looks like it is an area of fungal  rash.  Of course, her other problems are unchanged.  She has had an  enucleation of her left eye due to a tumor.  She has osteoporosis and  she did not have any evidence of any sort of fracture from her  fall.  She is having methicillin-resistant Staphylococcus aureus infection in  her sinus, and she has been on Xyzal.  It is not totally clear now what  she is having is an infection or colonization or what.  My assessment  then is that she is clearly worse.   PLAN:  Will be to have continue her medications and treatments.  Continue the IV fluids and follow.  Since she is not interested in being  transferred to the hospital, I am going to see what we can do here.  I  have told her that if she does not improve then she may end up having to  go to the hospital.      Ramon Dredge L. Juanetta Gosling, M.D.  Electronically Signed     ELH/MEDQ  D:  04/24/2008  T:  04/24/2008  Job:  [id number]

## 2010-10-02 NOTE — Group Therapy Note (Signed)
NAME:  [name], [name]               ACCOUNT NO.:  [id number]   MEDICAL RECORD NO.:  [id number]          PATIENT TYPE:  INP   LOCATION:  A314                          FACILITY:  [facility]   PHYSICIAN:  Edward L. Juanetta Gosling, M.D.DATE OF BIRTH:  Jan 13, 1923   DATE OF PROCEDURE:  DATE OF DISCHARGE:  01/15/2008                                 PROGRESS NOTE   Ms. Vanhook says she has much improved.  She feels better.  She has no new  complaints.  Her temperature is 97.5, pulse is 77, respirations are 16,  blood pressure 156/75.  She is awake and alert.  Her chest is clear.  Heart is regular.  Her abdomen is soft.  Extremities showed no edema.  Her iron studies done yesterday show iron is 73, binding capacity 359,  she is 20% saturated, which is on the borderline of iron deficiency, so  I am going to put on some iron.  We are going to check stools for blood.  She had one done yesterday that was negative.  She is having another one  done today.  Her BMET this morning shows that her sodium is 129, BUN is  5, creatinine 0.9.  TSH was 3.47, which is within limits and at this  point, she wants to be transferred back to her skilled care facility.  I  think that is okay.  I am going to discuss it with her daughter but she  is awake, she is alert.  She clearly can make her own decisions and I  think being transferred back is not unreasonable.  Please see discharge  summary for details.      Edward L. Juanetta Gosling, M.D.  Electronically Signed     ELH/MEDQ  D:  01/15/2008  T:  01/16/2008  Job:  [id number]

## 2010-10-02 NOTE — H&P (Signed)
NAME:  [name], [name]               ACCOUNT NO.:  [id number]   MEDICAL RECORD NO.:  [id number]          PATIENT TYPE:  INP   LOCATION:  IC01                          FACILITY:  [facility]   PHYSICIAN:  Edward L. Juanetta Gosling, M.D.DATE OF BIRTH:  [birth year]   DATE OF ADMISSION:  10/17/2008  DATE OF DISCHARGE:  06/01/2010LH                              HISTORY & PHYSICAL   HISTORY AND PHYSICAL AND DISCHARGE SUMMARY:   FINAL DISCHARGE DIAGNOSES:  1. Fractured right hip.  2. Hypertension.  3. Gastroesophageal reflux disease.  4. Carcinoma of the nasal sinus requiring enucleation of the left eye.  5. Hypothyroidism.  6. Osteoporosis.  7. Status post multiple compression fractures in the supine next.  8. Depression.   HISTORY:  [name] is an 77 year old who had been in her usual state of  good health when she fell to the floor.  She says that she felt like  something may have popped and then she fell.  She was brought to the  emergency room where she was found to have a fractured right hip.  She  has a very long known history of osteoporosis.  She has been on Forteo  and has finished her 2-year treatment of that.  She also has a number of  other medical problems.  She has had some problems with hyponatremia and  anemia.  Recently, she has had a MRSA infection of her nose.   PAST MEDICAL HISTORY:  1. Hypertension.  2. Gastroesophageal reflux disease.  3. Carcinoma of the nasal sinus which required removal of the hard and      soft palate on the left side and enucleation of the left eye.  4. Hypothyroidism.  5. Osteoporosis.  6. She has had multiple fractures.  7. She has had depression.   ALLERGIES:  SHE IS ALLERGIC TO CIPRO AND DARVOCET AND DOES NOT TOLERATE  BACTRIM.   SOCIAL HISTORY:  She is retired.  She lives at the [facility] for some  time.  She does not smoke, and she does not use any alcohol or illicit  drugs.   FAMILY HISTORY:  Positive for osteoporosis, anxiety, depression  and  asthma.   REVIEW OF SYSTEMS:  Except as mentioned is negative.   PHYSICAL EXAMINATION:  GENERAL:  A well-developed, well-nourished female  who appears to be in some distress due to pain.  VITAL SIGNS:  As recorded in the E-chart.  HEENT:  She has had the enucleation of the left eye.  Her mucous  membranes are moist.  Nose and throat are clear.  She has a prosthesis  that she uses over the eye area.  NECK:  Supple.  CHEST:  Clear without wheezes.  HEART:  Regular without murmur, gallop or rub.  ABDOMEN:  Soft.  No masses are felt.  EXTREMITIES:  No edema.  MUSCULOSKELETAL:  She has some deformity of her back because of the  previous multiple fractures.   LABORATORY DATA:  White count 7700, hemoglobin 11.1, platelets 157.  Cardiac markers are negative.  BMET shows a sodium is 140, potassium  4.5, BUN is 33,  creatinine 1.38.  Urinalysis shows positive nitrite.   ASSESSMENT:  She has a fractured hip.  I have discussed all this with  her family.  They requested she be transferred to Dr. Thurston Hole in  [place], and that will be done later today.      Edward L. Juanetta Gosling, M.D.  Electronically Signed     ELH/MEDQ  D:  10/18/2008  T:  10/18/2008  Job:  [id number]

## 2010-10-02 NOTE — Group Therapy Note (Signed)
NAME:  SUMER, MOOREHOUSE NO.:  [id number]   MEDICAL RECORD NO.:  [id number]          PATIENT TYPE:  ORB   LOCATION:  S126                          FACILITY:  [facility]   PHYSICIAN:  Edward L. Juanetta Gosling, M.D.DATE OF BIRTH:  1922-11-20   DATE OF PROCEDURE:  DATE OF DISCHARGE:                                 PROGRESS NOTE   Ms. Lechtenberg is, I think, better.  She says that she is feeling okay.  We  have been following electrolytes and her hemoglobin level and thus far  they have stabilized to some extent.  She has not had any changes in her  medications and her hemoglobin level although not perfect and is not  changed a great deal.  She has not had any more fractures in the spine.  She did have some problems with some swelling that seems a little bit  better since she had been back on some Lasix, and she has had an MRSA  infection in her nose.  She had recent evaluation by her ENT physician  and that was okay.  I did not change any meds today and I will plan to  follow along.  She will continue to have periodic reevaluation of her  CBC and her electrolytes.      Edward L. Juanetta Gosling, M.D.  Electronically Signed     ELH/MEDQ  D:  07/04/2008  T:  07/05/2008  Job:  [id number]

## 2010-10-02 NOTE — Discharge Summary (Signed)
NAME:  [name], DAIL NO.:  [id number]   MEDICAL RECORD NO.:  [id number]          PATIENT TYPE:  INP   LOCATION:  5016                         FACILITY:  [facility]   PHYSICIAN:  Eulas Post, MD    DATE OF BIRTH:  08-29-1922   DATE OF ADMISSION:  10/18/2008  DATE OF DISCHARGE:  10/21/2008                               DISCHARGE SUMMARY   ADMISSION DIAGNOSES:  Right reverse obliquity intertrochanteric hip  fracture.   DISCHARGE DIAGNOSES:  1. Right reverse obliquity intertrochanteric hip fracture.  2. Acute blood loss anemia.  3. Acute renal insufficiency.  4. Hypertension.  5. Reflux.  6. 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  7. Osteoporosis.  8. Hypothyroidism.  9. Multiple compression fractures of the spine in the past.  10.Depression.  11.History of methicillin-resistant Staphylococcus aureus nasal      infection.   PRIMARY PROCEDURES:  Right hip trochanteric femoral nailing performed  October 18, 2008.   DISCHARGE MEDICATIONS:  Will include Coumadin as well as Vicodin and she  will resume her home medications.   HOSPITAL COURSE:  [name] is an 77 year old woman who fell and  broke her right hip.  She was transferred to [facility] for  management.  She underwent trochanteric femoral nailing.  She tolerated  the procedure well and did not have any complications.  She did have  fairly significant postoperative anemia with a hemoglobin of 6.9.  She  received a total of 3 units of packed red blood cells.  Her hemoglobin  was 10 on the day of discharge.  She was given Coumadin and sequential  compression devices for DVT prophylaxis.  Her INR was 2.2 on the day of  discharge.  Her white count was 8.  She was having some slight increased  congestion and chest x-ray was taken.  This demonstrated possible  atelectasis.  A follow-up chest x-ray was ordered on the day of  discharge which is pending at  this time.  Her creatinine was as high as  1.9, but with fluids this came down to 1.4.  She had a speech evaluation  study done given her risk for aspiration and her congestion and this  indicated that she would do better with a dysphagia II diet as well as a  thick liquid.  She may benefit from a modified barium swallow study that  can be done as an outpatient.  Her creatinine on the day of discharge  was 1.23 and continued to improve.  She was unable to ambulate prior to  this surgery, with basically bed to wheelchair transfers, and she was  making basically the same  type of progress with therapy.  She is planned to be discharged to  skilled nursing facility for ongoing therapy and management.  She will  follow up with me in 2 weeks.  There are no complications and she  benefited maximally from her hospital stay.      Eulas Post, MD  Electronically Signed     JPL/MEDQ  D:  10/21/2008  T:  10/21/2008  Job:  [id number]

## 2010-10-02 NOTE — H&P (Signed)
NAME:  [name], [name]               ACCOUNT NO.:  [id number]   MEDICAL RECORD NO.:  [id number]          PATIENT TYPE:  INP   LOCATION:  A318                          FACILITY:  [facility]   PHYSICIAN:  Edward L. Juanetta Gosling, M.D.DATE OF BIRTH:  1922-06-29   DATE OF ADMISSION:  02/12/2008  DATE OF DISCHARGE:  LH                              HISTORY & PHYSICAL   REASON FOR ADMISSION:  Change in mental status and anemia.   [name] is a 77 year old who has been living at the [facility], but  was hospitalized in August with hyponatremia.  She was somewhat anemic  then, but had negative stool for blood and she was thought to have  mostly anemia of chronic disease, although she was being given iron  replacement as well.  Since then, she has continued to have confusion.  She has had much worse confusion here in the last day or so, and her  hemoglobin level had dropped.  She actually got 2 units of blood  yesterday, but she still had so far from the [facility] has negative  stools for blood.  It is not quite clear what has happened to her  hemoglobin level.  She has also had an MRSA infection of the sinus.  She  has been on multiple meds for that and it was felt that part of the  problem might have been with the  Bactrim that she had been on had made  her sick.  She says she does not want to be hospitalized.  I have told  her that we understand that she is just not getting better.   PAST MEDICAL HISTORY:  1. Positive for hypertension.  2. Gastroesophageal reflux disease.  3. She has had carcinoma of the nasal sinus area, had enucleation of      her left eye as part of surgery for carcinoma, some removal of hard      and soft palate on the left side.  4. Hypothyroidism, was in the hospital for this.   She has had multiple fractures.  She has also had depression.  She had  been anemic recently.   ALLERGIES:  She is allergic to CIPRO, DARVOCET, and obviously did not  tolerate BACTRIM.   MEDICATIONS:  1. At the nursing home, she has been on Actonel 35 mg weekly.  2. Colace 100 mg daily.  3. Prilosec 20 mg daily.  4. Metoprolol 25 mg daily.  5. Tylenol as needed.  6. Ativan 0.5 at bedtime.  She also takes it on a p.r.n. basis.  7. She had been on Zestril with HCTZ for blood pressure, but that had      been discontinued.  8. In the past, she had been on Forteo.  9. She had been on Zoloft.   SOCIAL HISTORY:  She is retired.  She was at [facility].  She does not  smoke.  She does not use any alcohol.  She has a family who lives close  by.   FAMILY HISTORY:  Positive for osteoporosis, anxiety, depression, and  asthma.   REVIEW OF SYSTEMS:  Except as mentioned is negative.   PHYSICAL EXAMINATION:  GENERAL:  Shows a well-developed, well-nourished  female who is anxious, but in no acute distress.  She looks somewhat  pale.  HEENT:  Her pupils were reactive to light and accommodation.  Her nose  and throat are clear.  NECK:  Her neck is supple without masses.  CHEST:  Clear.  HEART:  Regular without gallop.  ABDOMEN:  Soft.  No masses.  EXTREMITIES:  Showed no edema.  CNS:  Shows that she appears to be somewhat depressed and anxious,  otherwise negative.  She had some kyphosis of her lumbar and thoracic  spine.   ASSESSMENT:  She has had a fairly marked change in mental status.  She  had been cursing which is highly a character of  her.  She did develop  low sodium earlier but her sodium level checked earlier this week was  basically normal.  She has been anemic.  We do not have the source of  bleeding.  We are going to go and recheck stools for blood.  She is  going to transfused for, if needed.  I am going to put her IV fluids, to  try to get her overall better.      Edward L. Juanetta Gosling, M.D.  Electronically Signed     ELH/MEDQ  D:  02/12/2008  T:  02/13/2008  Job:  [id number]

## 2010-10-02 NOTE — Group Therapy Note (Signed)
NAME:  LALITA, EBEL NO.:  [id number]   MEDICAL RECORD NO.:  [id number]          PATIENT TYPE:  ORB   LOCATION:  S126                          FACILITY:  [facility]   PHYSICIAN:  Edward L. Juanetta Gosling, M.D.DATE OF BIRTH:  26-Oct-1922   DATE OF PROCEDURE:  DATE OF DISCHARGE:                                 PROGRESS NOTE   Ms. Roads has had another CBC done, and her hemoglobin seems to be  stable.  We have had her do another culture of her nasal secretions, and  she is growing MRSA again.  It is difficult, however, to tell if the  problem is actually that she has an infection with this or whether she  is now colonized.  However, she has not seen an ENT physician in a  while, and I may have her do that.  She has had a very difficult time  for the last several months with anemia that we relate could not  explain, unless it is from chronic disease with the MRSA infection and  hyponatremia.  She is currently unchanged, said she is feeling fairly  well, not a lot of  symptoms from her nasal passages, so it may be that  she does not have an actual infection right now.  However, she does have  positive cultures, so what I am going to do is put her back on the Zyvox  for a month.  I am going to go ahead and see if we can get her evaluated  by Dr. Lazarus Salines, her ear, nose, and throat physician, and get a better  idea whether this is truly an infection or not.      Edward L. Juanetta Gosling, M.D.  Electronically Signed     ELH/MEDQ  D:  03/27/2008  T:  03/28/2008  Job:  [id number]

## 2010-10-02 NOTE — Discharge Summary (Signed)
NAME:  [name], [name]               ACCOUNT NO.:  [id number]   MEDICAL RECORD NO.:  [id number]          PATIENT TYPE:  INP   LOCATION:  A318                          FACILITY:  [facility]   PHYSICIAN:  Edward L. Juanetta Gosling, M.D.DATE OF BIRTH:  [birth year]   DATE OF ADMISSION:  02/12/2008  DATE OF DISCHARGE:  LH                               DISCHARGE SUMMARY   Discharge summary on [name], a patient who is in observation for  less than 24 hours from the [facility].   FINAL DISCHARGE DIAGNOSES:  1. Change in mental status with inappropriate behavior, etiology      unknown.  2. Osteoporosis.  3. Anemia, status post blood transfusion.  4. History of methicillin-resistant Staphylococcus aureus infection of      the nasal cavity.  5. History of hyponatremia.  6. Gastroesophageal reflux disease.  7. Status post surgery for a carcinoma of the sinus with enucleation      of the left eye.   HISTORY:  [name] is an 77 year old who has been in her usual state of  pretty good health at the [facility].  She has had a difficult month  for the last month with an episode of MRSA infection of the sinus  cavities for which she had been treated.  She has also had anemia and  actually had a blood transfusion earlier this week.  She had an episode  of hyponatremia that was probably related to dehydration use of sulfa  drugs that she had, and then on the day of admission she had an episode  of fairly bizarre behavior for her.  Because it was unclear exactly what  was going on and because of concerns of her sodium level was down, that  her anemia might have been worse or that she might have had some sort of  a neurological event, I elected to bring her in for observation to the  hospital from her rest home.  Once she was brought to the hospital she  was found to be in her normal mental state. Her vital signs were stable.  She had no fever.  She did not have anything that would explain her  situation  on her lab work.  She has a urine culture pending, but her  hemoglobin level was good. Her sodium level was good.  Blood gas was  normal and plans were made to transfer her back to the skilled care  facility and that was done.  She is going to be off of her previous  Actonel and off of her new medication that was started for, perhaps,  Alzheimer's.      Edward L. Juanetta Gosling, M.D.  Electronically Signed     ELH/MEDQ  D:  02/13/2008  T:  02/13/2008  Job:  [id number]

## 2010-10-02 NOTE — H&P (Signed)
NAME:  [name], [name] NO.:  [id number]   MEDICAL RECORD NO.:  [id number]          PATIENT TYPE:  ORB   LOCATION:  S126                          FACILITY:  [facility]   PHYSICIAN:  Edward L. Juanetta Gosling, M.D.DATE OF BIRTH:  26-Apr-1923   DATE OF ADMISSION:  01/19/2008  DATE OF DISCHARGE:  LH                              HISTORY & PHYSICAL   Ms. Sweatman was admitted after having been at [facility] with  hyponatremia and a MRSA infection in her sinus.  She was also noted to  be anemic.  She did not have any heme-positive stools.  She can be  treated here with Procrit.  She says she is still weak.  She does not  feel as well as she would like, but otherwise things are going  relatively well.  She feels better than she did.   PAST MEDICAL HISTORY:  Positive for hypertension, gastroesophageal  reflux disease, carcinoma of the nasal sinus, enucleation of the left  eye as part of a surgery for the carcinoma, removal of the hard and soft  palate on the left side, hypothyroidism, osteoporosis, status post  multiple fractures, and severe problems with depression as well.   ALLERGIES:  She is allergic to CIPRO and DARVOCET and states she does  not tolerate BACTRIM.   SOCIAL HISTORY:  She is retired.  She has been living at the [facility]  for sometime.  She does not smoke.  She does not drink any alcohol.  She  does not use any illicit drugs.   FAMILY HISTORY:  Positive for osteoporosis, anxiety, depression, and  asthma.   REVIEW OF SYSTEMS:  Except as mentioned, is negative.  She has not  noticed any blood in her stool.   PHYSICAL EXAMINATION:  GENERAL:  She is awake and alert.  She says that  she is feeling better.  VITAL SIGNS:  Recorded.  HEENT:  She has had the enucleation of the left eye.  Her mucous  membranes are moist.  Her nose and throat are clear.  NECK:  Supple.  BACK:  She does have some deformity of her back because of her multiple  fractures.  HEART:   Regular without gallop.  ABDOMEN:  Soft.  EXTREMITIES:  No edema.   ASSESSMENT:  Is  that she is anemic, We are treating now with Procrit.  It looks like it is more of an anemia of chronic disease.  It may  actually be something to do with the fact that she has had so many  fractures, she may  actually have some bone marrow problem from all that.  She has  methicillin-resistant Staphylococcus aureus in her sinus that is being  treated and better.  She has osteoporosis, which is severe and being  treated.  She has depression, much improved and overall, seems to be  better.  I am going to plan to continue with the treatments and follow.      Edward L. Juanetta Gosling, M.D.  Electronically Signed     ELH/MEDQ  D:  01/30/2008  T:  01/30/2008  Job:  [id number] 

## 2010-10-02 NOTE — Op Note (Signed)
NAME:  [name], [name] NO.:  [id number]   MEDICAL RECORD NO.:  [id number]          PATIENT TYPE:  INP   LOCATION:  5016                         FACILITY:  [facility]   PHYSICIAN:  Eulas Post, MD    DATE OF BIRTH:  [birth year]   DATE OF PROCEDURE:  10/18/2008  DATE OF DISCHARGE:                               OPERATIVE REPORT   PREOPERATIVE DIAGNOSIS:  Right reverse obliquity intertrochanteric hip  fracture.   POSTOPERATIVE DIAGNOSIS:  Right reverse obliquity intertrochanteric hip  fracture.   OPERATIVE PROCEDURE:  Right hip long trochanteric femoral nailing.   ANESTHESIA:  General.   ESTIMATED BLOOD LOSS:  75 mL.   OPERATIVE IMPLANTS:  Synthes size 11 x 380 mm trochanteric femoral nail  with a size 85 mm helical blade.   PREOPERATIVE INDICATIONS:  [name] is an 77 year old woman who  fell and had a right intertrochanteric hip fracture with a reverse  obliquity component.  She and her family elected to undergo the above-  named procedures.  The risks, benefits, and alternatives were discussed  with her preoperatively including, but not limited to the risks of  infection, bleeding, nerve injury, malunion, nonunion, hardware  prominence, hardware failure, need for revision surgery, failure to  ambulate, blood clots, blood transfusions, cardiopulmonary  complications, among others, and they were willing to proceed.  We had  an in depth discussion regarding Hospice and the option for nonoperative  care as well, and they elected to proceed with surgery.   OPERATIVE PROCEDURE:  The patient was brought to the operating room,  placed in supine position.  General anesthesia was administered.  A 1 g  of  vancomycin was given, given her previous history of MRSA in her  nose.  She was placed in gentle traction and all bony prominences were  padded.  The right lower extremity was prepped and draped in the usual  sterile fashion.  The fracture was reduced under  C-arm guidance.  The  incision was made proximal to the greater trochanter and a Guidewire was  placed.  Her neck was fairly narrow, and given her overall version, I  have placed the pin slightly more anterior than I would normally have in  order to allow center-center position into the femoral head with the  guide pin.  After the guide pin was placed appropriately, we confirmed  position on AP and lateral views and then opened the proximal femur with  the reamer.  Overall, bone quality was extremely poor.  We then  introduced a nail.  We had measured the length of the nail prior to the  case.  After the nail was in place, we then placed the guide pin into  the center-center position on the femoral head.  Confirmation was made  on AP and lateral views.  The length has measured.  We placed our  helical blade.  There was reasonable bone at the head, which provided  satisfactory fixation.  We then irrigated the wound copiously.  I  elected not to lock the nail distally, as it was vertically a stable  fracture pattern and rotationally it was also stable, especially given  her functional advance, I elected to leave it unlock distally.  There  was reasonable interference of it, in the medullary  canal as well.  The wounds were irrigated copiously and closed with  Vicryl followed by Steri-Strips with skin.  Sterile gauze was applied.  She returned to the PACU in stable and satisfactory condition.  There  were no complications.  The patient tolerated the procedure well.      Eulas Post, MD  Electronically Signed     JPL/MEDQ  D:  10/18/2008  T:  10/19/2008  Job:  [id number]

## 2010-10-02 NOTE — H&P (Signed)
NAME:  [name], [name] NO.:  [id number]   MEDICAL RECORD NO.:  [id number]          PATIENT TYPE:  INP   LOCATION:  5016                         FACILITY:  [facility]   PHYSICIAN:  Eulas Post, MD    DATE OF BIRTH:  07/15/1922   DATE OF ADMISSION:  10/18/2008  DATE OF DISCHARGE:                              HISTORY & PHYSICAL   CHIEF COMPLAINTS:  Right hip pain.   HISTORY:  [name] is an 77 year old woman who complained of  right hip pain after a fall.  She fell down onto her right side.  She  was admitted to [facility], and her family requested transfer  to [facility].  She was seen and admitted by Dr. Juanetta Kaiser, her primary  care physician.  She complains of severe right-sided hip pain that is  relieved with rest and pain medications.  She was taking a fentanyl  patch for multiple musculoskeletal complaints prior to this fall, mostly  from her compression fractures in her spine.  She describes the pain  currently as sharp pain and it is worse with trying to move.  She is  unable to walk.   PAST MEDICAL HISTORY:  1. Hypertension.  2. Reflux disease.  3. 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  4. Osteoporosis status post Forteo treatment.  5. Hypothyroidism.  6. Multiple compression fractures of the spine.  7. Depression.  8. History of MRSA infection.   FAMILY HISTORY:  1. Osteoporosis.  2. Anxiety.  3. Depression.  4. Asthma.  There apparently is no history of cancer per the patient.   SOCIAL HISTORY:  She lives with her daughter.  According to the patient,  she says that she was previously able to drive, as well as ambulating  without a walker.  She says she does all of her own cooking and is able  to go to the grocery store and back.  After speaking with her family, it  is clear that she actually can barely transfer to a wheelchair and back,  and was not able to ambulate if at all.   REVIEW  OF SYSTEMS:  Complete review of systems was performed and was  negative with the exception of the musculoskeletal complaints as above.   PHYSICAL EXAMINATION:  CONSTITUTIONAL:  She is alert and oriented and is  appropriate through our interaction.  She is in mild pain.  She is  reasonably well developed, well nourished.  EYE:  Her right eye extraocular movements are intact.  Her left eye is  status post enucleation and has well-healed closure.  LYMPHATIC:  I do not appreciate any significant cervical or axillary  lymphadenopathy.  CARDIOVASCULAR:  She has a regular rate and rhythm.  She has no  significant pedal edema.  RESPIRATORY:  She has no cyanosis and no increase in respiratory  efforts.  PSYCHIATRIC:  Her judgment and insight seem to be intact.  SKIN:  She has no skin breakdown on the region of the right hip.  There  is no  open fracture.  NEUROLOGIC:  Her sensation seems to be intact throughout her lower  extremity.  MUSCULOSKELETAL:  She is laying in a fetal-type position.  She has  extreme pain with any type of motion of her right hip.  EHL and FHL are  firing on both lower extremities.  Her calves are soft.   LABORATORY VALUES:  She has a creatinine of 1.3, a hemoglobin of 11, a  urinalysis that demonstrates bacteria with positive nitrites.  She has  cardiac enzymes, which are negative.  Her chest x-ray does not  demonstrate any acute disease.  Her x-ray demonstrates a basicervical  femoral neck fracture with a reverse obliquity component.   IMPRESSION:  1. Right intertrochanteric hip fracture, basicervical with a reverse      obliquity component.  2. Asymptomatic bacteriuria.   PLAN:  I have had a long discussion with [name] regarding surgical  versus nonsurgical options.  She is currently inclined towards having  surgery in order to optimize the stability of her hip and for palliative  pain control.  At the current time given her overall status and the  fracture  pattern, I am recommending a trochanteric femoral nail.  The  risks, benefits, and alternatives have been discussed at length with  her, including blood transfusion, among many others, and she is willing  to proceed.  I am going to plan to also have a further discussion with  her daughter, [name], whose cell phone is [phone] or her husband,  [name], at [phone].  After discussing with them, we will plan to  proceed accordingly.      Eulas Post, MD  Electronically Signed     JPL/MEDQ  D:  10/18/2008  T:  10/18/2008  Job:  [id number]

## 2010-10-02 NOTE — Group Therapy Note (Signed)
NAME:  RAYE, WIENS NO.:  [id number]   MEDICAL RECORD NO.:  [id number]          PATIENT TYPE:  ORB   LOCATION:  S126                          FACILITY:  [facility]   PHYSICIAN:  Edward L. Juanetta Gosling, M.D.DATE OF BIRTH:  [birth year]   DATE OF PROCEDURE:  DATE OF DISCHARGE:                                 PROGRESS NOTE   Ms. Medero is overall, I think, better.  She has had some problems with  viral illness, but that has improved.  She has had a little bit of leg  swelling but that is also improved.  She has no other new complaints.   PHYSICAL EXAMINATION:  GENERAL:  She is awake and alert.  She has had  enucleation of her left eye.  CHEST:  Clear.  HEART:  Regular.  She has trace of any edema of her legs.   ASSESSMENT:  She has had problems with an methicillin-resistant  Staphylococcus aureus infection of her nasal cavity that seems to have  resolved.  She has had problems with osteoporosis which were stable.  She has had an enucleation of her left eye for cancer.  She has had  multiple osteoporotic fractures but none recently and overall, I think,  she is doing better.   PLAN:  Continue with her meds and treatments, with no changes.      Edward L. Juanetta Gosling, M.D.  Electronically Signed     ELH/MEDQ  D:  10/03/2008  T:  10/04/2008  Job:  [id number]

## 2010-10-02 NOTE — Group Therapy Note (Signed)
NAME:  RONETTE, HANK NO.:  [id number]   MEDICAL RECORD NO.:  [id number]          PATIENT TYPE:  ORB   LOCATION:  S126                          FACILITY:  [facility]   PHYSICIAN:  Edward L. Juanetta Gosling, M.D.DATE OF BIRTH:  1922-08-13   DATE OF PROCEDURE:  DATE OF DISCHARGE:                                 PROGRESS NOTE   Ms. Cressler has had difficulty with confusion, but that seems a little  better.  There may have been problems with medications.  Otherwise, she  is without pain.  She has chronic constipation, which I think is about  the same.  She has had some difficulties with low sodium, but we checked  that about 2 weeks and it was okay.  She was anemic that is better and  overall, I think she is much improved.  At this point, she has no new  complaints.  I do not think there is anything else to be added at this  point and my plan is for her to continue with her treatment or doing a  little bit of __________ We are going to repeat her lab work and make  sure that she maintains a good sodium level and good hemoglobin level.      Edward L. Juanetta Gosling, M.D.  Electronically Signed     ELH/MEDQ  D:  02/27/2008  T:  02/27/2008  Job:  [id number]

## 2010-10-02 NOTE — Group Therapy Note (Signed)
NAME:  KAYLIE, RITTER NO.:  [id number]   MEDICAL RECORD NO.:  [id number]          PATIENT TYPE:  ORB   LOCATION:  S126                          FACILITY:  [facility]   PHYSICIAN:  Edward L. Juanetta Gosling, M.D.DATE OF BIRTH:  07/20/1922   DATE OF PROCEDURE:  DATE OF DISCHARGE:                                 PROGRESS NOTE   Ms. Brittingham says that she is doing better.  She has no new complaints.  She has had a fall which actually was something of a slight out of a  chair.  Her hemoglobin level has been okay.  Her sodium level has been  okay and overall she says she feels better.  She is concerned about  losing her hair.   Her physical examination shows that she is awake and alert.  Her chest  is clear.  Her heart is regular.  Her abdomen is soft.  She does have  thinning hair.   Assessment then that she has multiple medical problems including  methicillin-resistant Staphylococcus aureus infection of the nasal  cavities.  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  She has had anemia, which is better.   My plan then is to continue with her treatment.  I do not think we need  to change anything right now.  As far as her hair is concerned, I think  that is basically a problem with having had several illnesses in the  last several months.  No changes in her meds or treatments at this  point.      Edward L. Juanetta Gosling, M.D.  Electronically Signed     ELH/MEDQ  D:  07/30/2008  T:  07/31/2008  Job:  [id number]

## 2010-10-02 NOTE — Group Therapy Note (Signed)
NAME:  [name], [name]               ACCOUNT NO.:  [id number]   MEDICAL RECORD NO.:  [id number]          PATIENT TYPE:  INP   LOCATION:  A314                          FACILITY:  [facility]   PHYSICIAN:  Edward L. Juanetta Gosling, M.D.DATE OF BIRTH:  27-Oct-1922   DATE OF PROCEDURE:  DATE OF DISCHARGE:                                 PROGRESS NOTE   [name] was admitted yesterday with MRSA sinus infection.  She was  also markedly hyponatremic, and she is anemic as well.  She is  chronically constipated.  She has severe osteoarthritis, osteoporosis,  and multiple compression fractures, but this morning, she does say that  she feels better.   PHYSICAL EXAMINATION:  VITAL SIGNS:  Her temperature is 98.4, pulse 75,  respirations 18, blood pressure 127/66.  CHEST:  Clear.  She does look a little better.  HEART:  Regular.  ABDOMEN:  Soft.  Extremities:  No edema.   Her white count 6000, hemoglobin has dropped to 8.4, it was about 9.9 on  January 12, 2008, but I think this is mostly delusional.  She does have  microcytic indices.  She says that she has had a lifelong history of  anemia.  So, I am going to go ahead and check stools for blood.  She is  chronically constipated, so it may be difficult to get that.  I am  trying to put her back on some iron, and she may need GI evaluation, but  I think we can probably do that as an outpatient.  Her electrolytes this  morning show her sodium is 125, up from 119, so much improved with fluid  replacement.  BUN is 12 and creatinine 1.16.   ASSESSMENT:  She has methicillin-resistant Staphylococcus aureus sinus  infection.  She has had a previous enucleation of her left eye and  surgery on her sinus cavity from cancer.  She has osteoporosis, which is  severe, status post multiple compression fractures.  She has  hyponatremia and improving anemia.  I am going to check iron studies and  start her on some iron and check stools.  She is significantly fatigued.  I am  going to go ahead and see if we can get PT to see her as well.  Perhaps get her up and moving her hand a little more and get her  strength back.  We will repeat labs in the morning.      Edward L. Juanetta Gosling, M.D.  Electronically Signed     ELH/MEDQ  D:  01/14/2008  T:  01/14/2008  Job:  [id number]

## 2010-10-02 NOTE — H&P (Signed)
NAME:  [name], TIPPENS NO.:  [id number]   MEDICAL RECORD NO.:  [id number]          PATIENT TYPE:  ORB   LOCATION:  S126                          FACILITY:  [facility]   PHYSICIAN:  Edward L. Juanetta Gosling, M.D.DATE OF BIRTH:  Apr 03, 1923   DATE OF ADMISSION:  10/21/2008  DATE OF DISCHARGE:  LH                              HISTORY & PHYSICAL   Ms. Misch is an 77 year old, who has lived at the [facility] for  sometime and who has been recently admitted to [facility] for a hip  fracture.  She has a long known history of osteoporosis.  She has had a  history of depression.  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  She had her surgery done  to fix her hip and has done well since.   Her past medical history is positive for the hip fracture, positive for  depression, positive for the previous carcinoma as mentioned, and she  has had multiple compression fractures in her spine.  She has had  episodes of anemia which may be related to her bone problems.  Otherwise, she has had some difficulty with hyponatremia.   Her social history, she has lived at the [facility] for sometime.  She  is a nonsmoker.  She does not use any alcohol.   Her family history is positive for asthma.   Her review of systems except as mentioned is negative.   Her physical examination shows that she looks comfortable.  Her chest is  clear.  Her vital signs are as recorded.  Her heart is regular without  murmur, gallop, or rub.  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  Her neck is supple.  Her abdomen is soft.  Her extremities show that she  has had the previous fracture and surgery for that otherwise normal.   Assessment then she has had the hip fracture, multiple other medical  problems.   I do not plan to change her treatments.      Edward L. Juanetta Gosling, M.D.  Electronically  Signed     ELH/MEDQ  D:  10/29/2008  T:  10/30/2008  Job:  [id number]

## 2010-10-02 NOTE — H&P (Signed)
NAME:  [name], [name]               ACCOUNT NO.:  [id number]   MEDICAL RECORD NO.:  [id number]          PATIENT TYPE:  INP   LOCATION:  A314                          FACILITY:  [facility]   PHYSICIAN:  Edward L. Juanetta Gosling, M.D.DATE OF BIRTH:  10-14-1922   DATE OF ADMISSION:  01/13/2008  DATE OF DISCHARGE:  LH                              HISTORY & PHYSICAL   Ms. Boyum is an 77 year old who was at [facility], had been  doing relatively well, but was diagnosed with an MRSA infection of the  sinus about a month ago.  She was placed on Bactrim DS by Dr. Lazarus Salines,  the ear, nose, and throat physician who had made the diagnosis, but she  did not tolerate that well.  She has gotten increasingly weak.  Orders  were receive for her to be switched to an other antibiotic, but she has  continued having problems.  I had ordered a chest x-ray, BMET, and CBC  yesterday.  Her sodium was quite low at about 116, potassium was 5.9.  There was some concern about perhaps some hemolysis, so I had it  repeated this morning, and her sodium is still 119, her potassium is  better, but she did receive Kayexalate last night.  I had discussed  admission with her last night when I saw her, which she really did not  want to do.  She wanted to be sure that she her lab work was in fact  correct.  She was lucid, so I did not admit her, but told that I would  plan to admit her the next morning if her lab work still showed  problems, and it did, so she has agreed now to come into the hospital.  She was hoping that she could be treated as an outpatient with fluid  restriction, but she says she knows that she is too weak to do that now.   PAST MEDICAL HISTORY:  1. Hypertension.  2. Gastroesophageal reflux disease.  3. Carcinoma of the nasal sinus.  4. Enucleation of the left eye as part of the surgery for the      carcinoma.  5. Removed the hard and soft palate on the left side.  6. Hypothyroidism.  7. Osteoporosis,  status post multiple fractures.   She is allergic to CIPRO and DARVOCET.  She says she does not tolerate  BACTRIM.Marland Kitchen   She has been on:  1. Actonel 35 mg weekly.  2. Colace 100 mg daily.  3. Prilosec 20 mg daily.  4. Metoprolol 25 mg daily.  5. Tylenol as needed.  6. Ativan 0.5 at bedtime as needed and on a p.r.n. basis.  7. She has been on Zestril for her blood pressure as well, 20 mg      daily.  8. She has had Forteo in the past for osteoporosis, and it made a      great deal of difference.  She has not fractured in several years.   SOCIAL HISTORY:  She is retired.  She lives at the [facility].  She  does not smoke.  She  does not drink any alcohol.  I forgot to mention  that she also has a history of depression.   FAMILY HISTORY:  Positive for osteoporosis, anxiety, depression, and  asthma.   REVIEW OF SYSTEMS:  Except as mentioned, is negative.  She has not had  any nausea, vomiting.  She does have some incontinence of urine.   PHYSICAL EXAMINATION:  GENERAL:  Shows a well-developed, well-nourished  female who is in no acute distress.  HEENT:  Her right eye pupil is reactive.  Left eye is missing.  She has  a prosthesis for her facial bones.  Her mucous membranes are slightly  dry.  NECK:  Supple, without masses.  CHEST:  Clear, without wheezes, rales, or rhonchi.  HEART:  Regular, without murmur, gallop or rub.  ABDOMEN:  Soft.  No masses felt.  EXTREMITIES:  Showed no edema.  CENTRAL NERVOUS SYSTEM:  Grossly intact.   ASSESSMENT:  1. She is hyponatremic.  2. She was hyperkalemic, but that has resolved.  3. She was mildly anemic, so we are going to see what all that looks      like again in the morning.  4. She has severe osteoporosis which is better off now that she has      been on Forteo.  5. She has hypertension.   PLAN:  I am going to hold the Zestril because of the hyperkalemia, and I  am going to have her continue with all of her other medications, and I   will repeat labs in the morning including her thyroid.  She is going to  get IV fluids.  She is going to be on fluid restriction.      Edward L. Juanetta Gosling, M.D.  Electronically Signed     ELH/MEDQ  D:  01/13/2008  T:  01/14/2008  Job:  [id number]

## 2010-10-02 NOTE — Group Therapy Note (Signed)
NAME:  [name], [name] NO.:  [id number]   MEDICAL RECORD NO.:  [id number]         PATIENT TYPE:  PORB   LOCATION:  S126                          FACILITY:  [facility]   PHYSICIAN:  Edward L. Juanetta Gosling, M.D.DATE OF BIRTH:  10-21-1922   DATE OF PROCEDURE:  DATE OF DISCHARGE:                                 PROGRESS NOTE   Ms. Lewing is overall I think about the same.  She says that she is not  feeling as well and she had a cough earlier today.  She is not sure what  that is from.  She has no other new complaints.  She just says she does  not feel as well she would like.   Her exam showed her chest is pretty clear now.  Her heart is regular.  Her abdomen is soft.  Extremities showed no edema.   She has had lab work which, looks unchanged.   Assessment then she has had significant hyponatremia, which is  stabilized.  She has had significant anemia.  She seems to be doing okay  with that.  She has depression which I think is unchanged.  She has had  an MRSA infection in her nose with no evidence of recurrence, but she  did have a cough yesterday and overall I think she is a little bit  better.   My plan then continue with her meds and treatments.  We are going to  watch her carefully to see if anything comes in for her not feeling  well.      Edward L. Juanetta Gosling, M.D.  Electronically Signed     ELH/MEDQ  D:  08/20/2008  T:  08/20/2008  Job:  [id number]

## 2010-10-05 NOTE — Group Therapy Note (Signed)
NAME:  [name], [name] NO.:  [id number]   MEDICAL RECORD NO.:  [id number]          PATIENT TYPE:  ORB   LOCATION:  S126                          FACILITY:  [facility]   PHYSICIAN:  Edward L. Juanetta Gosling, M.D.     DATE OF BIRTH:   DATE OF PROCEDURE:  03/09/2006  DATE OF DISCHARGE:                                   PROGRESS NOTE   [facility]   SUBJECTIVE:  Ms. Wimberley is overall about the same.  She did develop some  swelling which was a least, temporarily, related to taking the Fosamax for  her bone density.  She has had a venous study done that was normal.  She has  been on some diuretics; and now she has compression stockings on; and these  seem to be working.  Otherwise, she is says that her she does not have any  pain in her joints.  No fractures as far she knows.  She is not having any  other complaints.   OBJECTIVE:  Her exam shows her chest is clear.  Heart is regular.  She has  trace if any edema.  CNS:  Grossly intact.   ASSESSMENT:  1. She has osteoporosis which is severe.  2. She has a leg edema which, I think, is probably related to Fosamax.  3. She has had a enucleation of her left eye for cancer.  4. She has chronic constipation which is pretty much unchanged.  5. The edema, I think, is multifactorial and mostly related to her the      fact that she keeps her legs down; and that she has started the      Fosamax.   PLAN:  I do plan to change anything.      Edward L. Juanetta Gosling, M.D.  Electronically Signed     ELH/MEDQ  D:  03/09/2006  T:  03/10/2006  Job:  [id number]

## 2010-10-05 NOTE — Group Therapy Note (Signed)
NAME:  [name], [name]                         ACCOUNT NO.:  [id number]   MEDICAL RECORD NO.:  [id number]                   PATIENT TYPE:  INP   LOCATION:  A309                                 FACILITY:  [facility]   PHYSICIAN:  Edward L. Juanetta Gosling, M.D.             DATE OF BIRTH:  [birth year]   DATE OF PROCEDURE:  12/31/2003  DATE OF DISCHARGE:                                   PROGRESS NOTE   PROBLEM:  Compression fracture of lumbar spine.   SUBJECTIVE:  [name] says she is feeling much better today.  She has less  pain, she is not as uncomfortable as before.   PHYSICAL EXAMINATION:  VITAL SIGNS:  Temperature 99.5, pulse 70,  respirations 20, blood pressure 130/64, O2 saturation 96% on room air.  CHEST:  Clear.  HEART:  Regular.   ASSESSMENT:  She seems to be doing much better.   PLAN:  Try to get her up using her brace.  Continue with the pain  medications.  Continue with heating pad, etc., as needed.      ___________________________________________                                            Oneal Deputy Juanetta Gosling, M.D.   ELH/MEDQ  D:  12/31/2003  T:  12/31/2003  Job:  [id number]

## 2010-10-05 NOTE — Group Therapy Note (Signed)
NAME:  [name], [name] NO.:  [id number]   MEDICAL RECORD NO.:  [id number]          PATIENT TYPE:  ORB   LOCATION:  S126                          FACILITY:  [facility]   PHYSICIAN:  Edward L. Juanetta Gosling, M.D.DATE OF BIRTH:  10/31/1932   DATE OF PROCEDURE:  DATE OF DISCHARGE:                                   PROGRESS NOTE   [name] says she is doing well with no complaints. She says that she does  have occasional bouts still of depression but that she generally is doing  well with that. She has no other new complaints. She is awake and alert. She  is over her sinus infection. Her skin is clear. Her chest is clear.   ASSESSMENT:  Overall things are better. Her depression is, I think, about  the same. She does have severe depression. She has severe osteoporosis, for  which she is taking Forteo and that seems to have prevented any further  fractures. She has enucleation of her eye, chronic constipation, but  basically is doing well. I do not plan any new treatments today.      Edward L. Juanetta Gosling, M.D.  Electronically Signed     ELH/MEDQ  D:  04/06/2005  T:  04/07/2005  Job:  [id number]

## 2010-10-05 NOTE — Group Therapy Note (Signed)
NAME:  [name], [name] NO.:  [id number]   MEDICAL RECORD NO.:  [id number]          PATIENT TYPE:  ORB   LOCATION:  S126                          FACILITY:  [facility]   PHYSICIAN:  Edward L. Juanetta Gosling, M.D.DATE OF BIRTH:  April 03, 1923   DATE OF PROCEDURE:  DATE OF DISCHARGE:                                 PROGRESS NOTE   HISTORY:  [name] is having a little more problems with her eye.  She  went to the ENT surgeon and is using some saline spray in them.  Otherwise, she is doing about the same.  She has no other new  complaints.   PHYSICAL EXAMINATION:  CHEST:  Pretty clear.  HEART:  Regular.  ABDOMEN:  Soft.  EXTREMITIES:  Show no edema.  HEENT:  The eye area does show some inflammation and irritation.   ASSESSMENT:  She has previous cancer with enucleation of the eye.  She  is having some more problems with that.  She has severe osteoporosis  status post multiple fractures, stable.  She has depression, which seems  to be doing quite well.  She has a current acute infection of the eye  area.   PLAN:  Ceftin 250 mg b.i.d. x 10 days.      Edward L. Juanetta Gosling, M.D.  Electronically Signed     ELH/MEDQ  D:  12/13/2006  T:  12/14/2006  Job:  [id number]

## 2010-10-05 NOTE — Group Therapy Note (Signed)
NAME:  KENZY, CAMPOVERDE NO.:  [id number]   MEDICAL RECORD NO.:  [id number]          PATIENT TYPE:  ORB   LOCATION:  S126                          FACILITY:  [facility]   PHYSICIAN:  Edward L. Juanetta Gosling, M.D.DATE OF BIRTH:  [birth year]   DATE OF PROCEDURE:  02/24/2005  DATE OF DISCHARGE:                                   PROGRESS NOTE   SUBJECTIVE:  Mrs. Lightle continues about the same. She has no new complaints.  She is somewhat constipated. She has had a sinus infection which seems to be  getting better but slowly. She does not have any other new problems or  complaints.   OBJECTIVE:  Her physical examination shows that she is awake and alert, her  chest is fairly clear, sinuses perhaps mildly tender.   ASSESSMENT:  She is pretty much unchanged, she still has of course her  enucleation device, she has sinus infection, her chest is clear, she has  severe osteoporosis which is unchanged.   PLAN:  Plan then is no changes in her treatments, continue with meds and  follow.      Edward L. Juanetta Gosling, M.D.  Electronically Signed     ELH/MEDQ  D:  02/24/2005  T:  02/24/2005  Job:  [id number]

## 2010-10-05 NOTE — Op Note (Signed)
NAME:  [name], KOCHER NO.:  [id number]   MEDICAL RECORD NO.:  [id number]                   PATIENT TYPE:  INP   LOCATION:  3006                                 FACILITY:  [facility]   PHYSICIAN:  Danae Orleans. Venetia Maxon, M.D.               DATE OF BIRTH:  20-Dec-1922   DATE OF PROCEDURE:  11/01/2003  DATE OF DISCHARGE:                                 OPERATIVE REPORT   PREOPERATIVE DIAGNOSIS:  L2 and L3 pathologic compression fractures with  osteoporosis and severe back pain.   POSTOPERATIVE DIAGNOSIS:  L2 and L3 pathologic compression fractures with  osteoporosis and severe back pain.   PROCEDURE:  L2 and L3 kyphoplasties with bone biopsy.   SURGEON:  Danae Orleans. Venetia Maxon, M.D.   ANESTHESIA:  Local lidocaine with IV sedation.   COMPLICATIONS:  None.   DISPOSITION:  To recovery.   INDICATIONS:  [name] is a 77 year old woman who had previously  undergone L1 and T12 kyphoplasties for benign pathologic compression  fracture secondary to osteoporosis.  She now has gone on to fracture of L2  and L3 vertebral bodies.  It was elected to take her to surgery for  kyphoplasties at these affected levels.   PROCEDURE:  [name] was brought to the operating room.  Following  administration of intravenous sedation, the patient was placed in the prone  position on chest and pelvic rolls and her AP and lateral fluoroscopy was  positioned to visualized the L2 and L3 levels.  Using local lidocaine to  numb the subcutaneous tissues and periosteum of the vertebrae, a stab  incision was made initially at the L2 level on the right pedicle and the  introducer was inserted into the vertebra through a transpedicular approach  and guide pin was placed and the drill was utilized.  The inflatable bone  tamp was then placed and under direct visualization using AP and lateral  fluoroscopy, the bone tamp was inflated within the fractured vertebra.  There was good restoration of  vertebral body height along the superior  endplate fracture.  The bone was extremely soft and pressures above 100 were  not generated.  Subsequently, a similar positioning of an inflatable bone  tamp was placed at the L3 vertebra and again, low pressures were  encountered.  The bone tamp was inflated without difficulty.  Bone cement  was then mixed and inserted into the fractured vertebra, initially at the L3  level; 6 mL of bone cement were placed with good filling at the L3 level; 3  complete fills were performed.  On the lateral view, there did seem to be  some of the bone cement backing down the pedicle and consequently, the fill  was stopped.  Final AP and lateral films were taken with the cannulae  removed.  Incisions were sutured with 3-0 Vicryl sutures then dressed with  Dermabond.  The patient awakened in the operating  room and taken to the  recovery room in stable and satisfactory condition, having tolerated the  operation well.  Counts were correct at the end of the case.                                              Danae Orleans. Venetia Maxon, M.D.   JDS/MEDQ  D:  11/01/2003  T:  11/02/2003  Job:  [id number]

## 2010-10-05 NOTE — Discharge Summary (Signed)
NAME:  [name], WARRIOR NO.:  [id number]   MEDICAL RECORD NO.:  [id number]                   PATIENT TYPE:  IPS   LOCATION:  4007                                 FACILITY:  [facility]   PHYSICIAN:  Ellwood Dense, M.D.                DATE OF BIRTH:  1922-09-29   DATE OF ADMISSION:  08/02/2003  DATE OF DISCHARGE:                                 DISCHARGE SUMMARY   ANTICIPATED DATE OF DISCHARGE:  August 11, 2003.   DISCHARGE DIAGNOSES:  1. T12-L1 kyphoplasty with bone biopsy, secondary to T12 compression     fracture.  2. History of hypertension.  3. History of carcinoma of the nasal sinus, status post eye removal and     sinus resection.  4. History of hypothyroidism.  5. Status post urinary tract infection.  6. Mild hyponatremia.   HISTORY OF PRESENT ILLNESS:  The patient is a 77 year old white female with  a history of hypertension, GERD, 7 to 10 days of the low back pain secondary  to a T12 compression fracture, admitted for March 12 for pain control and  workup.  T12-L1 kyphoplasty with bone biopsy on July 29, 2003 by Dr. Maeola Harman was performed.  Pain management after the procedure.  She was placed  on Lovenox for DVT prophylaxis.  PT report at this time indicates that the  patient is ambulating 25 feet with a rolling walker and minimal assist and  can transfer minimal assist.  Bed mobility minimal assist.  The patient's  hospital course while at [facility] was for mild hyponatremia and  questionable UTI and bilateral feet numbness.  The patient was transferred  to the [facility] Department on August 02, 2003.   PAST MEDICAL HISTORY:  1. Hypertension.  2. Reflux.  3. Carcinoma of the nasal sinus.  4. Hypothyroidism.  5. IBS.  6. Broken back.   PAST SURGICAL HISTORY:  1. Sinus resection.  2. Eye removal.  3. L5 laminectomy in 2004.  4. Palate prosthesis.   MEDICATIONS:  1. Aciphex 20 mg daily.  2. Synthroid 0.015 mg  daily.  3. Actonel 5 mg daily.  4. Toprol XL 100 mg daily.  5. HCTZ 25 mg daily.  6. Caltrate.  7. Citrucel.  8. Percocet.   SOCIAL HISTORY:  The patient lives in an apartment attached to her daughter'  s house, independent prior to hospitalization.  Good family support.  Her  sister and daughter do work during the day.   ALLERGIES:  1. PENICILLIN.  2. DARVOCET.   PRIMARY CARE PHYSICIAN:  Nonda Lou, M.D., [place], [place] (It may be Mark  Risinzo, I am not really sure).   REVIEW OF SYMPTOMS:  Significant for back pain and feet numbness.   FAMILY HISTORY:  Noncontributory.   HOSPITAL COURSE:  [name] was admitted to the [facility] Department on August 02, 2003 for  comprehensive patient  rehabilitation and to receive more than three hours of therapy daily.  Overall, [name] progressed fairly well during her stay and was discharged  at overall supervision level secondary to cognition and safety.  She  remained on Lovenox 30 mg SQ q. 12 hours for DVT prophylaxis.  No bleeding  complications were noted.  The patient's small surgical sites showed no  signs of infection.  The patient remained in a back corset while standing  and sitting.   Hospital course significant for hyponatremia and urinary tract infection.  The patient was placed on a sodium restriction due to sodium level of 126  which was performed on August 03, 2003.  The patient was on a 1500 sodium  restriction starting on August 03, 2003.  Sodium did improve on August 08, 2003 and sodium was 130 and then sodium restriction was increased to 1800 cc  daily on August 07, 2003.  The patient also remained on Synthroid 150 mcg  daily for a history of hypothyroidism.  Her blood pressure remained in good  control on HCTZ 25 mg daily, but we will place it on hold due to  hyponatremia.  The patient also completed an antibiotic course, Bactrim one  tablet p.o. b.i.d. for the remaining three days for a  urinary tract  infection.  No other major issues occurred while the patient was in  rehabilitation.  She was able to tolerate therapies daily, but she  occasionally had confusion at times.   Latest labs indicated latest hemoglobin 12.6, hematocrit 36.1, white cell  count 5.8, platelet count 287.  Latest sodium 130, potassium 3.9, chloride  94, CO2 28, glucose 98, BUN 8, creatinine 1.1, AST 28, ALT 19, alkaline  phosphatase 103.  Urine cultures performed on August 02, 2003:  E. coli  30,000 colonies.  PT report at the time of discharge indicates the patient  is able to ambulate approximately 150 feet at supervision level, minimal  assist with steps, supervision level for transfers.  Occupational therapy:  She required 24/7 assistance, especially with the don and doff the brace and  needs family caregiver for education.  Due to requiring 24 hours of  supervision at this time, it is thought best for the patient to be  transferred to a skilled nursing facility until the family is able to  provide her 24 hour care.  Therefore the patient was transferred to a  skilled nursing facility on August 11, 2003.  At the time of discharge all  vitals were stable and she was discharged home to a skilled nursing  facility.   DISCHARGE MEDICATIONS:  1. HCTZ 25 mg p.o. daily, on hold.  2. Synthroid 150 mcg p.o. daily.  3. Toprol 100 mg tablet daily.  4. Multivitamin one tablet daily.  5. Os-Cal +D daily.  6. Senokot-S two tablets p.o. q.h.s.  7. Trazodone 25 to 50 mg p.o. q.h.s. p.r.n.  8. Oxycodone 5 to 10 mg p.o. q. four to six hours p.r.n.  9. Tylenol 325 to 650 mg p.o. q. four to six hours.   The patient will continue to have physical therapy and occupational therapy.  She will continue the don and doff brace, sitting, and to follow-up with  neurosurgeon, Dr. Maeola Harman, within two to three weeks.  She will follow- up with her primary care Caydee Talkington within three weeks concerning hyponatremia  to  see if it resolved and follow-up with Dr. Ellwood Dense as needed.  Drucilla Schmidt, P.A.                         Ellwood Dense, M.D.    LB/MEDQ  D:  08/10/2003  T:  08/10/2003  Job:  [id number]   cc:   Ellwood Dense, M.D.  [address] [place]  [place] [address]  Fax: [phone]   Danae Orleans. Venetia Maxon, M.D.  [address]  [place]  [place] [address]  Fax: [phone]   Nonda Lou, M.D.  [place], [place]

## 2010-10-05 NOTE — Discharge Summary (Signed)
NAME:  [name], [name] NO.:  [id number]   MEDICAL RECORD NO.:  [id number]                   PATIENT TYPE:  INP   LOCATION:  3006                                 FACILITY:  [facility]   PHYSICIAN:  Danae Orleans. Venetia Maxon, M.D.               DATE OF BIRTH:  18-Nov-1922   DATE OF ADMISSION:  10/31/2003  DATE OF DISCHARGE:  11/07/2003                           DISCHARGE SUMMARY - REFERRING   REASON FOR ADMISSION:  [name] is a 77 year old woman with a history  of compression fracture of L5 to T12 and L1 compression fracture, now with  increased pain.  The T12 and L1 fractures which were previously treated with  kyphoplasty.  She now comes in with increased pain and was found to have new  fractures of L2 and L3 levels.  It was elected to take her to surgery for  kyphoplasty of these effected levels and this was done on November 01, 2003.  She had good relief of back pain with this procedure and did well and was  gradually mobilized.  She was felt to require transfer to nursing home for  further care.  She had a urinary tract infection with a Foley catheter.  Foley catheter was removed.   She was started on Bactrim double strength one p.o. b.i.d. for 10 days for  urinary tract infection.  Additional medications include Milk of Magnesia 30  mL q.h.s.,  Docusate sodium 100 mg p.o. q.12h.,  Protonix 40 mg p.o. daily,  metoprolol XL 25 mg daily, Synthroid 175 mcg daily.  Actonel 5 mg daily.  Lorazepam 0.5 mg p.o. t.i.d. p.r.n.  Zofran 4 mg IV q.6h. p.r.n.  Tylenol,  Ativan 1 mg p.o. q.h.s. p.r.n.  Oxycodone 5/325 one to two p.o. q.4h. p.r.n.   DISCHARGE INSTRUCTIONS:  Follow-up with Dr. Channing Mutters in the office.  Take it easy  while at the nursing home.                                                Danae Orleans. Venetia Maxon, M.D.    JDS/MEDQ  D:  11/07/2003  T:  11/07/2003  Job:  [id number]

## 2010-10-05 NOTE — Group Therapy Note (Signed)
NAME:  JEZABEL, LECKER NO.:  [id number]   MEDICAL RECORD NO.:  [id number]          PATIENT TYPE:  ORB   LOCATION:  S126                          FACILITY:  [facility]   PHYSICIAN:  Edward L. Juanetta Gosling, M.D.DATE OF BIRTH:  [birth year]   DATE OF PROCEDURE:  08/10/2005  DATE OF DISCHARGE:                                   PROGRESS NOTE   SUBJECTIVE:  Ms. Menon is overall about the same.  She has no new  complaints.  She says that she is doing okay.   OBJECTIVE:  GENERAL:  Her physical exam shows no changes.  CHEST:  Chest is clear.  HEART:  Regular.   ASSESSMENT:  She has significant problems with osteoporosis.  She has  constipation.  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   PLAN:  My plan is that she is getting close to ending her 2 months on Forteo  treatment and I am not sure what the next step is.  I am trying to ge some  information from the manufacturer.  She failed Actonel with fractures while  she was on Actonel, so I am not sure that is the way to go, but will try to  get some information and then decide.  I do not plan any other new  treatments now.      Edward L. Juanetta Gosling, M.D.  Electronically Signed     ELH/MEDQ  D:  08/10/2005  T:  08/12/2005  Job:  [id number]

## 2010-10-05 NOTE — Op Note (Signed)
NAME:  [name], [name] NO.:  [id number]   MEDICAL RECORD NO.:  [id number]                   PATIENT TYPE:  INP   LOCATION:  3101                                 FACILITY:  [facility]   PHYSICIAN:  Payton Doughty, M.D.                   DATE OF BIRTH:  [birth year]   DATE OF PROCEDURE:  10/01/2002  DATE OF DISCHARGE:                                 OPERATIVE REPORT   PREOPERATIVE DIAGNOSIS:  L5 compression fracture with retropulsed bone  fragment and a left L5 radiculopathy.   POSTOPERATIVE DIAGNOSIS:  L5 compression fracture with retropulsed bone  fragment and a left L5 radiculopathy.   PROCEDURES:  L5 laminectomy, decompression L5 root, with pedicle screws at  L4, L5, and S1, and posterolateral arthrodesis L4-5, L5 S1.   SURGEON:  Payton Doughty, M.D.   ASSISTANT:  Hewitt Shorts, M.D.   ANESTHESIA:  General endotracheal.   PREPARATION:  Sterile Betadine prep and scrub with alcohol wipe.   COMPLICATIONS:  None.   DESCRIPTION OF PROCEDURE:  A 77 year old lady with an L5 compression  fracture and retropulsed bone.  She was taken to the operating room and  smoothly anesthetized and intubated, placed prone on the operating table.  Following shave, prep, and drape in the usual sterile fashion, the skin was  infiltrated with 1% lidocaine and 1:400,000 epinephrine.  The skin was  incised from mid-L3 to mid-S1, and the laminae of L4, L5, and S1 were  exposed bilaterally out over the transverse processes and the sacral alae,  respectively.  The laminectomy of L4 was carried out to the top of the  ligamentum flavum.  It was removed in a retrograde fashion and partial  removal of the superior lamina and pars interarticularis of L5 was carried  out.  This allowed decompression of the L5 root as it traversed the L5  pedicle out to the dorsal root ganglion.  Exploration of the root revealed a  retropulsed bone fragment that was compressed back into the L5  vertebral  body, resulting in decompression of the nerve root.  The nerve was then  carefully explored and found to be free as it coursed from the common dural  tube all the way out to the neural foramen.  Pedicle screws then placed in  L4, L5, and S1 bilaterally.  The bone was quite friable, but the screws  seemed to obtain a good purchase.  Having placed pedicle screws,  intraoperative x-rays showed that the pedicle screw placement was optimal.  The connecting rods were then placed across it after the appropriate  contouring.  The wound was irrigated and hemostasis assured.  The remaining  lamina was decorticated, as were the transverse processes, and Vitoss and  DBX and morcellized allograft bone were placed in these positions.  The  fascia was then reapproximated with 0 Vicryl interrupted fashion, the  subcutaneous tissue  was reapproximated in  interrupted fashion, the subcuticular was reapproximated with 3-0 Vicryl in  interrupted fashion.  Skin was closed with 3-0 nylon in a running locked  fashion.  A Betadine Telfa dressing was applied and made occlusive with  OpSite.  The patient returned to the recovery room in good condition.                                               Payton Doughty, M.D.    MWR/MEDQ  D:  10/01/2002  T:  10/02/2002  Job:  [id number]

## 2010-10-05 NOTE — Group Therapy Note (Signed)
NAME:  [name], [name] NO.:  [id number]   MEDICAL RECORD NO.:  [id number]          PATIENT TYPE:  ORB   LOCATION:  S126                          FACILITY:  [facility]   PHYSICIAN:  Edward L. Juanetta Gosling, M.D.DATE OF BIRTH:  02/15/1923   DATE OF PROCEDURE:  DATE OF DISCHARGE:                                   PROGRESS NOTE   [name] seems to be doing about the same. She has no new complaints, says  she is actually feeling well with no problems. She denies any nausea. No  vomiting. No changes in her chronic depression. Her back is doing well. She  has not had any recent compressions.   Her exam shows that her chest is clear. She is awake and alert. Her area of  enucleation of her left eye looks about the same. She does not seem to be  having any infection in this area now.   Assessment is that she is overall doing well.   Plan is to continue with her medications and treatments including her  injectable treatments for osteoporosis. I do not plan any other changes  today.      Edward L. Juanetta Gosling, M.D.  Electronically Signed     ELH/MEDQ  D:  05/30/2005  T:  05/31/2005  Job:  [id number]

## 2010-10-05 NOTE — H&P (Signed)
NAME:  [name], [name] NO.:  [id number]   MEDICAL RECORD NO.:  [id number]          PATIENT TYPE:  ORB   LOCATION:  S126                          FACILITY:  [facility]   PHYSICIAN:  Edward L. Juanetta Gosling, M.D.DATE OF BIRTH:  06-23-1922   DATE OF ADMISSION:  DATE OF DISCHARGE:  LH                                HISTORY & PHYSICAL   Annual Visit and Routine Monthly Evaluation   HISTORY:  Patient says that she is feeling much better in general.  She  continues to get stronger. She is using a wheelchair. She is still concerned  that she is not quite ready to get back up on the walker.  She has had  multiple compression fractures; and I have told her that I would defer to  physical therapy as far as that goes.   In addition to her multiple compression fractures she has had hypoplasties,  she has hypothyroidism, hypertension, history of a nasal sinus carcinoma,  and her osteoporosis.  She has had depression which has been more of a  problem in the nursing home.   PAST MEDICAL HISTORY:  Positive for multiple episodes with problems with  fractures with minimal, if any trauma.  She has been in the facility, now,  for about 4 months and she had been in the facility previously for a shorter  period of time.   SOCIAL HISTORY:  Her daughter lives in town, as she had been living with her  daughter, but it became clear that patient required more care than could be  provided in a home environment.  She is a nonsmoker. She does not drink any  alcohol.   FAMILY HISTORY:  Positive for osteoporosis in at least 1 other family  member.   PHYSICAL EXAMINATION:  HEENT:  Her exam shows that she has an eye patch on  her left eye.  She has very large prosthesis in the roof of her mouth.  CHEST:  Her chest is clear.  She is in a wheelchair.  HEART:  Her heart is regular.  ABDOMEN:  Her abdomen is soft.  EXTREMITIES:  Show no edema.  CENTRAL NERVOUS SYSTEM:  Examination is grossly intact.   ASSESSMENT:  She has multiple medical problems.   PLAN:  To continue with her treatments here and __________ in the nursing  home. I do not plan any new evaluation or treatments right now.     Edwa   ELH/MEDQ  D:  05/24/2004  T:  05/24/2004  Job:  [id number]

## 2010-10-05 NOTE — Group Therapy Note (Signed)
NAME:  [name] NO.:  [id number]   MEDICAL RECORD NO.:  [id number]          PATIENT TYPE:  ORB   LOCATION:  S126                          FACILITY:  [facility]   PHYSICIAN:  Edward L. Juanetta Gosling, M.D.DATE OF BIRTH:  Feb 08, 1923   DATE OF PROCEDURE:  12/06/2004  DATE OF DISCHARGE:                                   PROGRESS NOTE   SUBJECTIVE:  [name] overall doing about the same.  She is has no new  complaints and no new problems. She has some complaints in the area of her  eye which has been a chronic problem when it gets hot and humid.  She  developed some irritation of the skin where she has had the closure under  her eye where the enucleation occurred.  Otherwise, she says she is okay.  She is not short of breath.  She is not having any new problems.  She has a  fair appetite and she has gained some weight.   OBJECTIVE:  HEENT:  Exam shows that the skin under the eye looks slightly  inflamed.  HEART:  Regular.  ABDOMEN:  Soft.  GENERAL:  She looks much better in general.   ASSESSMENT:  She is much improved.   PLAN:  The plan is to continue treatments.  Efforts at nutritional  improvement, etc., and follow.       ELH/MEDQ  D:  12/06/2004  T:  12/06/2004  Job:  [id number]

## 2010-10-05 NOTE — Group Therapy Note (Signed)
NAME:  LOUKISHA, GUNNERSON NO.:  [id number]   MEDICAL RECORD NO.:  [id number]          PATIENT TYPE:  ORB   LOCATION:  S126                          FACILITY:  [facility]   PHYSICIAN:  Edward L. Juanetta Gosling, M.D.DATE OF BIRTH:  [birth year]   DATE OF PROCEDURE:  DATE OF DISCHARGE:                                 PROGRESS NOTE   Ms. Jergens is overall better.  She says her nose is better.  Her chest is  feeling okay.  She has no new complaints.  Her physical examination  today shows her chest is pretty clear, her heart is regular, her  enucleated eye is about the same and her nose looks clearer.   ASSESSMENT:  She is better.   PLAN:  To continue with her current medications and treatments and  follow.  I do not plan to change anything today.      Edward L. Juanetta Gosling, M.D.  Electronically Signed     ELH/MEDQ  D:  05/31/2007  T:  05/31/2007  Job:  [id number]

## 2010-10-05 NOTE — Group Therapy Note (Signed)
NAME:  [name], [name]                         ACCOUNT NO.:  [id number]   MEDICAL RECORD NO.:  [id number]                   PATIENT TYPE:  ORB   LOCATION:  S126                                 FACILITY:  [facility]   PHYSICIAN:  Edward L. Juanetta Gosling, M.D.             DATE OF BIRTH:  07/28/1922   DATE OF PROCEDURE:  DATE OF DISCHARGE:                                   PROGRESS NOTE   [name] was readmitted to the [facility] about three weeks ago after  another compression fracture.  She says she is feeling pretty well, but she  still has problems with pain when she has to have a bowel movement, etc.  She is otherwise doing fairly well.  She has no new complaints.   Her exam today shows that her chest is clear.  Heart is regular.  She is  much less confused than she has been.  I did not examine her back.  Her  examination of her facial lesion is unchanged.   ASSESSMENT:  She has severe osteoporosis.  She is on Forteo.  She has had  multiple compression fractures and multiple cement procedures for this.  The  latest fracture really is not amenable to using the cement procedure.  She  has depression, which seems to be  bit better.  She has had a previous  surgery on her face requiring enucleation of her left eye.   I do not plan any new treatments today.  She seems to be doing better.  We  will simply continue with what we are doing.      ___________________________________________                                            Oneal Deputy. Juanetta Gosling, M.D.   ELH/MEDQ  D:  01/28/2004  T:  01/28/2004  Job:  [id number]

## 2010-10-05 NOTE — Group Therapy Note (Signed)
NAME:  [name], [name] NO.:  [id number]   MEDICAL RECORD NO.:  [id number]          PATIENT TYPE:  ORB   LOCATION:  S126                          FACILITY:  [facility]   PHYSICIAN:  Edward L. Juanetta Gosling, M.D.DATE OF BIRTH:  23-Jan-1923   DATE OF PROCEDURE:  DATE OF DISCHARGE:                                   PROGRESS NOTE   [facility]:  [name] is off the floor at the time that I came to visit, and apparently  is with family.  I have discussed her situation with the staff, and no new  problems have been noted.  She seems to be doing well, and has no  complaints.   PHYSICAL EXAMINATION:  Physical examination, of course, could not be done.   ASSESSMENT:  She is apparently doing about the same.   PLAN:  My plan is to follow up later, since she is not available now.      ELH/MEDQ  D:  09/30/2004  T:  10/01/2004  Job:  [id number]

## 2010-10-05 NOTE — Group Therapy Note (Signed)
NAME:  [name], [name] NO.:  [id number]   MEDICAL RECORD NO.:  [id number]          PATIENT TYPE:  ORB   LOCATION:  S126                          FACILITY:  [facility]   PHYSICIAN:  Edward L. Juanetta Gosling, M.D.DATE OF BIRTH:  Oct 31, 1922   DATE OF PROCEDURE:  DATE OF DISCHARGE:                                   PROGRESS NOTE   [name] is overall doing okay, but she had an episode of tachycardia that  was unexplained.  She had a positive blood culture, but I believe that was  probably a contaminant.  She is on antibiotics and is having some diarrhea,  which I think is probably associated with the antibiotics but since I am not  absolutely certain that her positive blood culture was a contaminant, I do  not want to take her off the antibiotics just yet.  She is otherwise about  the same.  She does not have any other new complaints.   She has not had any more fever.  She does not have any chills.  Her chest is  clear.  Her heart is regular.  She looks actually pretty comfortable now.   ASSESSMENT:  She is doing okay.  She has had tachycardia.  Her heart rate  now is not tachycardic and she looks much better in general.   Assessment, then, is that she had an episode of tachycardia, which may have  been related to an infection, but it is not totally clear.  She has had no  recurrences and considering all that, I am simply going to have her continue  with her medications and treatments and watch her carefully.  She is about  to finish the antibiotics, so I do not think we need to do anything else as  far as that is concerned.   As far as her other problems, her osteoporosis, she is back now on a  bisphosphonate, which seems to be working fairly well, and she is otherwise  doing about the same.  Her eye, of course, is unchanged.  Her depression is  doing quite well.      Edward L. Juanetta Gosling, M.D.  Electronically Signed     ELH/MEDQ  D:  12/21/2005  T:  12/21/2005  Job:   [id number]

## 2010-10-05 NOTE — Group Therapy Note (Signed)
NAME:  CORTEZ, STEELMAN NO.:  [id number]   MEDICAL RECORD NO.:  [id number]          PATIENT TYPE:  ORB   LOCATION:  S126                          FACILITY:  [facility]   PHYSICIAN:  Edward L. Juanetta Gosling, M.D.DATE OF BIRTH:  [birth year]   DATE OF PROCEDURE:  07/25/2004  DATE OF DISCHARGE:                                   PROGRESS NOTE   and   SUBJECTIVE:  Ms. Carvell is overall much improved. She has had no further  fractures at least not recently.   PHYSICAL EXAMINATION:  exam shows her chest  CHEST:  Clear.  HEART:  Is regular.  ABDOMEN: Soft.  EXTREMITIES: No edema. her she is having some irritation of her  HEENT:  She is having some irritation of her eye area.   ASSESSMENT:  Is that overall she seems much improved.   PLAN:  The plan is to continue with her medications, continue with  treatments including her Forteo.  That seems to have made a great deal of  difference with her fractures and I think we should continue that as she  goes along.  I do not plan any new treatments today.      ELH/MEDQ  D:  07/25/2004  T:  07/25/2004  Job:  [id number]

## 2010-10-05 NOTE — H&P (Signed)
NAME:  [name], [name]                         ACCOUNT NO.:  [id number]   MEDICAL RECORD NO.:  [id number]                   PATIENT TYPE:  INP   LOCATION:  A325                                 FACILITY:  [facility]   PHYSICIAN:  Patrica Duel, M.D.                 DATE OF BIRTH:  1922/12/28   DATE OF ADMISSION:  09/26/2002  DATE OF DISCHARGE:                                HISTORY & PHYSICAL   CHIEF COMPLAINT:  Severe back pain.   HISTORY OF PRESENT ILLNESS:  This is a 77 year old female with history of  hypertension controlled, hyperthyroidism compensated, osteoporosis currently  on Actonel therapy and esophageal reflux disease.  She is generally disabled  from diffuse osteoarthritis and uses a walker at home.  She remains  functional, however.   For the past several days, the patient has experienced increasingly severe  pain in her lower back with radiation of left hip and occasionally to her  toes.  She denies any trauma or significant neurological deficits.  She  underwent evaluation in the emergency room at [facility].  X-ray of  the hip revealed osteoarthritis and osteopenia only.  She was treated with  analgesics and discharged.  The patient's symptoms continued to become  increasingly severe.  I was contacted yesterday and given the high  probability of radiculopathy, she was instructed to go to [facility] where definitive diagnosis and intervention could be entertained.  Apparently, she saw a P.A. who stated he was unable to do an MRI and  diagnosed her with constipation, low back pain and discharged her.  Her pain  is completely unmanageable at home and she was brought to the hospital after  I was contacted by the daughter.  She was a direct admit for pain control  with further evaluation and therapies as indicated.  There is no history of  fever, chills, nausea, vomiting, diarrhea, headache, neurologic deficits,  chest pain, shortness of breath or  genitourinary symptoms.  She has not had  a bowel movement in three days.   CURRENT MEDICATIONS:  1. Toprol XL 100 mg p.o. q.d.  2. Aciphex 20 mg q.d.  3. Synthroid 150 mcg q.a.m.  4. HCTZ 25 mg q.d.  5. Actonel 5 mg q.d.   ALLERGIES:  No known drug allergies.   PAST MEDICAL HISTORY:  As noted above.   SOCIAL HISTORY:  She lives alone and has an attentive family.  Nonsmoker and  nondrinker.   REVIEW OF SYMPTOMS:  Negative except for as mentioned in history of present  illness.   FAMILY HISTORY:  Noncontributory.   PHYSICAL EXAMINATION:  GENERAL:  This is a very pleasant female who is in  severe distress when she is moved.  VITAL SIGNS:  Within normal limits.  HEENT:  Normocephalic, atraumatic.  The right pupil is normal and left is  obscured by patch which she wears chronically.  NECK:  Supple, no bruits, thyromegaly or lymphadenopathy.  LUNGS:  Clear to A&P.  HEART:  Heart sounds are normal without murmurs, rubs or gallops.  ABDOMEN:  Nontender, nondistended.  RECTAL:  The colon is palpable and nontender.  EXTREMITIES:  No clubbing, cyanosis or edema.  NEUROLOGIC:  No significant deficits.  Straight leg raise is positive on the  left.  She has mild tenderness of the left hip and tenderness of the sciatic  notch on the left.   LABORATORY DATA AND X-RAY FINDINGS:  CBC reveals a white count of 15,000  (probably secondary to steroids), H&H normal.  MET 7 within normal limits  except for sodium 125.  Repeat was 121 this morning.   ASSESSMENT:  1. Chronic problems as noted above stable.  2. Acute back pain with probable radicular symptoms of the left lower     extremity.  3. Uncontrollable pain.  4. Hyponatremia, most likely secondary to hydrochlorothiazide.  Consider     other entity such as syndrome of inappropriate diuretic hormone, etc.   PLAN:  1. Pain control.  2. MRI ASAP.  3. Empiric steroids.  4. Will hold HCTZ.  5. Check urine serum.  6. Will follow  expectantly.                                               Patrica Duel, M.D.    MC/MEDQ  D:  09/27/2002  T:  09/27/2002  Job:  [id number]

## 2010-10-05 NOTE — Op Note (Signed)
NAME:  [name], [name] NO.:  [id number]   MEDICAL RECORD NO.:  [id number]                   PATIENT TYPE:  INP   LOCATION:  3009                                 FACILITY:  [facility]   PHYSICIAN:  Danae Orleans. Venetia Maxon, M.D.               DATE OF BIRTH:  12-07-1922   DATE OF PROCEDURE:  07/29/2003  DATE OF DISCHARGE:                                 OPERATIVE REPORT   PREOPERATIVE DIAGNOSIS:  T12 and L1 compression fractures.   POSTOPERATIVE DIAGNOSIS:  T12 and L1 compression fracture.   PROCEDURE:  T12 and L1 kyphoplasty with bone biopsy and fluoroscopic  interpretation.   SURGEON:  Danae Orleans. Venetia Maxon, M.D.   ASSISTANT:  Nurse.   ANESTHESIA:  General endotracheal anesthesia.   ESTIMATED BLOOD LOSS:  Minimal.   COMPLICATIONS:  None.   DISPOSITION:  To recovery.   INDICATIONS:  [name] is a 77 year old woman with multiple level  vertebral body compression fractures.  She had previously undergone fusion  surgery in the low lumbar spine.  She has now developed T12 fracture which  is acute on MRI.  She has superior end-plate partial compression fracture  with increased signal at L1 and to a lesser degree at the T11 and T10  levels.  We elected to take her to surgery for kyphoplasty of T12 and L1  levels.   DESCRIPTION OF PROCEDURE:  [name] was brought to the operating room.  Following the satisfactory and uncomplicated induction of general  endotracheal anesthesia, she was placed in the prone position on chest and  pelvic rolls.  Her back was then prepped and draped in the usual sterile  fashion after preoperative localization with AP and lateral fluoroscopic  images.  Using one shot technique, the right side T12 vertebra was entered.  The cannula of the bone was extremely soft, and the introducer was inserted  into the vertebrae with minimal effort.  The biopsy was then performed, and  the balloon was then inserted.  With minimal pressures, this  reexpanded and  filled the vertebral body and reconstituted the vertebral body.  Subsequently at the L1 level on the left one shot introducer was placed and  this bone was also extremely soft and again the anterior column was  supported the Kyphon inflatable bone tamp.  Subsequently, the bone cement  was mixed and the tamps were inflated to a pressure of approximately 110 PSI  with good remodeling of the affected vertebrae.  Three complete fills were  then placed, initially at the T12 level as well as the L1 level with good  reconstitution of the vertebrae and good interdigitation of bone cement  without any extravasation of bone cement.  The introducer was removed after  the bone cement was hardened.  Final  x-ray demonstrated well positioned bone cement.  The incisions were closed  with interrupted Vicryl sutures and the wounds were dressed with Dermabond.  The patient was extubated in the operating room and taken to the recovery  room in stable satisfactory condition having tolerated the procedure well.                                               Danae Orleans. Venetia Maxon, M.D.    JDS/MEDQ  D:  07/29/2003  T:  07/31/2003  Job:  [id number]

## 2010-10-05 NOTE — Group Therapy Note (Signed)
NAME:  LEVETTE, PAULICK NO.:  [id number]   MEDICAL RECORD NO.:  [id number]           PATIENT TYPE:   LOCATION:                                 FACILITY:   PHYSICIAN:  Edward L. Juanetta Gosling, M.D.     DATE OF BIRTH:   DATE OF PROCEDURE:  05/10/2005  DATE OF DISCHARGE:                                   PROGRESS NOTE   The patient in the [facility].   Ms. Hamor says she is doing okay.  She is overall feels the best she has  felt in some time.  Her back is doing very well.  She has not had any new  fractures in some time, and she has tolerated the Forteo very well.  Otherwise she says she has had a sinus infection which is better and she is  clear.  Heart is regular.  She is sitting in a wheelchair, but she does  ambulate using a walker some.  This is very good.  Overall, she is much  improved.  I do not plan any new treatments today.  I will plan to see her.  I will continue with her medications including the Forteo and hopefully she  will continue to do well.  Her depression also seems much improved.      Edward L. Juanetta Gosling, M.D.  Electronically Signed     ELH/MEDQ  D:  05/11/2005  T:  05/11/2005  Job:  [id number]

## 2010-10-05 NOTE — Group Therapy Note (Signed)
NAME:  QUANNA, WITTKE NO.:  [id number]   MEDICAL RECORD NO.:  [id number]          PATIENT TYPE:  ORB   LOCATION:  S126                          FACILITY:  [facility]   PHYSICIAN:  Edward L. Juanetta Gosling, M.D.DATE OF BIRTH:  1922/06/14   DATE OF PROCEDURE:  01/24/2007  DATE OF DISCHARGE:                                 PROGRESS NOTE   Tekela is in the skilled care facility.   Ms. Lippold says she is better.  She has had significant problems with her  nose where she had the  previous enucleation and surgery but says she is  better now.  She has no other new problems.   Her physical examination today is that she is awake and alert.  She is  using a different kind of patch over her eye which may be helping some  with her sense of having her nose always congested and clogged up.  Otherwise, I think she is doing better.  Her osteoporosis is well  controlled.  She has had no fractures in the last 2 years or so. Her  tumor has been reevaluated by Dr. Lazarus Salines and seems to be doing okay.  Her depression is doing well, so I do not plan to change anything today.      Edward L. Juanetta Gosling, M.D.  Electronically Signed     ELH/MEDQ  D:  01/25/2007  T:  01/25/2007  Job:  [id number]

## 2010-10-05 NOTE — Group Therapy Note (Signed)
NAME:  [name], [name] NO.:  [id number]   MEDICAL RECORD NO.:  [id number]          PATIENT TYPE:  OUT   LOCATION:  RDC                           FACILITY:  [facility]   PHYSICIAN:  Edward L. Juanetta Gosling, M.D.DATE OF BIRTH:  07-23-1922   DATE OF PROCEDURE:  DATE OF DISCHARGE:  07/17/2005                                   PROGRESS NOTE   Ms. Bourque says she is doing okay.  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  She says she has chronic  constipation.  She had been switched off of Forteo, and she is doing better  on the weekly bisphosphonate.  She has no new complaints.  Everything else  is about the same and she says that she feels a bit better.  She wants to  see if she can walk again, and I have asked for physical therapy to see her  again.   My plan then is to continue with her medications and continue the weekly  bisphosphonate.  Hold off on the Forteo.  Try to get her up and moving  around and see how she does with all that.      Edward L. Juanetta Gosling, M.D.  Electronically Signed     ELH/MEDQ  D:  11/03/2005  T:  11/04/2005  Job:  [id number]

## 2010-10-05 NOTE — Discharge Summary (Signed)
NAME:  [name], [name]                         ACCOUNT NO.:  [id number]   MEDICAL RECORD NO.:  [id number]                   PATIENT TYPE:  INP   LOCATION:  A325                                 FACILITY:  [facility]   PHYSICIAN:  Patrica Duel, M.D.                 DATE OF BIRTH:  1922-08-23   DATE OF ADMISSION:  09/26/2002  DATE OF DISCHARGE:  09/29/2002                                 DISCHARGE SUMMARY   NOTE:  Transferred to [facility].   HISTORY:  For details regarding admission, please refer to admitting note.  Briefly, this 77 year old female with a history of controlled hypertension,  compensated hypothyroidism, and osteoporosis, on Actonel therapy, as well as  gastroesophageal reflux disease and osteoarthritis presented to the [facility] emergency room two days prior to admission with severe lower back pain  with radiation to her left hip and leg.  An x-ray of the hip revealed  osteoarthritis and osteopenia.  She was treated with analgesics and  discharged.  Her symptoms continued to become increasingly severe.  She was  instructed to go to [facility] where she could undergo definitive testing and  intervention, given the severity of her symptoms.  She saw a Chief Technology Officer, who diagnosed her with constipation, low back pain, and  discharged her.  The family, all of whom are very reliable and well known by  me, called and explained the situation; I directly admitted her for pain  control and further evaluation and therapy of a probable lumbar  radiculopathy.  There is no history of fever, chills, nausea, vomiting,  diarrhea, headaches, persistent neurologic deficits, chest pain, shortness  of breath, or genitourinary symptoms.  She has not had a bowel movement in  three days.  There was no trauma or fall in the recent past.   COURSE IN THE HOSPITAL:  Patient was treated with high-dose steroids and  injected narcotics with marginal relief of her symptoms; she has improved  very  slightly.  She is still unable to consider getting out of bed; any  movement causes severe pain.   An MRI was obtained as quickly as possible; this revealed the presence of a  subacute wedge compression fracture of the L5 vertebral body with  retropulsion of an osseous fragment into the canal (this is approximately 6  mm).  There was also a left paramedian disk herniation at L5, contacting the  left L4 nerve root.  An MRI of her hips was completely normal, per  radiology.   Dr. Channing Mutters was consulted, who, on the telephone, given the history, physical,  as well as findings on MRI, has felt that transfer for probable surgical  intervention is indicated.  She has not responded adequately to medical  therapy.   DISPOSITION:  As per Dr. Channing Mutters.  The patient will be kept n.p.o. should  surgery be entertained for today.  Patrica Duel, M.D.    MC/MEDQ  D:  09/29/2002  T:  09/29/2002  Job:  [id number]

## 2010-10-05 NOTE — Group Therapy Note (Signed)
NAME:  [name], AKTER NO.:  [id number]   MEDICAL RECORD NO.:  [id number]          PATIENT TYPE:  ORB   LOCATION:  S126                          FACILITY:  [facility]   PHYSICIAN:  Edward L. Juanetta Gosling, M.D.DATE OF BIRTH:  Feb 15, 1923   DATE OF PROCEDURE:  09/07/2005  DATE OF DISCHARGE:                                   PROGRESS NOTE   Ms. Massaro is about the same. She has no new complaints. She has had no new  fractures. She, of course has had multiple fractures of her lumbar spine.  She has been on Forteo. She is coming up on two years now on Forteo and  after review of available data it appears that she needs to be switched to  something like Actonel, Fosamax, etc. if she tolerates it, and then leave  her on that for a period of maybe  6 months or so, and then switch her back  to [facility] if need be. I have asked for a bone density but I do not have the  results of that yet. She is otherwise doing fairly well.   ASSESSMENT:  She has osteoporosis which is severe, and enucleation of the  left eye unchanged, depression doing great.   The plan is to continue with her treatments and medications, follow this.      Edward L. Juanetta Gosling, M.D.  Electronically Signed     ELH/MEDQ  D:  09/09/2005  T:  09/10/2005  Job:  [id number]

## 2010-10-05 NOTE — H&P (Signed)
NAME:  [name], [name] NO.:  [id number]   MEDICAL RECORD NO.:  [id number]          PATIENT TYPE:  ORB   LOCATION:  S126                          FACILITY:  [facility]   PHYSICIAN:  Edward L. Juanetta Gosling, M.D.DATE OF BIRTH:  [birth year]   DATE OF ADMISSION:  02/18/2006  DATE OF DISCHARGE:  10/02/2007LH                              HISTORY & PHYSICAL   CHIEF COMPLAINT:  The patient has had a long known history of multiple  medical problems and has lived at the [name] for several years now.  She has a long known history of osteoarthritis and osteoporosis.  Her  osteoporosis has been severe.  She has had multiple fractures and she  has been on multiple medications for that.  In the interim, she has also  had several upper respiratory infections.  She has had a good bit of  cough and congestion and she is over most of that now.  She has had some  problems with leg swelling recently.   PAST MEDICAL HISTORY:  1. Depression.  2. 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  3. Osteoporosis with multiple compression fractures.  4. Swelling of her legs recently.  5. Significant depression, which has improved.   FAMILY HISTORY:  Positive for asthma.   SOCIAL HISTORY:  She has lived at the [facility] for some time.  She  does not smoke.  She does not drink any alcohol.   REVIEW OF SYSTEMS:  Other than as mentioned is pretty well negative.   PHYSICAL EXAMINATION:  GENERAL:  She has a bandage over the enucleated  area.  VITALS:  As recorded.  HEENT:  Her mucous membranes are moist.  Nose and throat are clear.  CHEST:  Clear.  HEART:  Regular without murmurs, gallops or rub.  ABDOMEN:  Soft.  No masses are felt.  EXTREMITIES:  Show no edema now.  CNS:  Grossly intact.   ASSESSMENT:  She has multiple medical problems but seems to be doing  overall much better.  She has not had any fractures in some time and  that has been the biggest problem that she has had and she has done  quite well with that recently.  I do not have to change any of her  treatments right now.  We will plan to follow and continue with her  other meds and see how she does.      Edward L. Juanetta Gosling, M.D.  Electronically Signed     ELH/MEDQ  D:  08/02/2006  T:  08/03/2006  Job:  [id number]

## 2010-10-05 NOTE — H&P (Signed)
NAME:  VESTAL, CRANDALL NO.:  [id number]   MEDICAL RECORD NO.:  [id number]          PATIENT TYPE:  ORB   LOCATION:  S126                          FACILITY:  [facility]   PHYSICIAN:  Edward L. Juanetta Gosling, M.D.DATE OF BIRTH:  [birth year]   DATE OF ADMISSION:  DATE OF DISCHARGE:  LH                                HISTORY & PHYSICAL   She says that in general she has done much better over the last year. She  has considerably less problem with her back. She has had no recent fracture.  She has been using a wheelchair. She has been using a walker some, and  overall, she is doing very well.   In addition to her previous compression fractures, her past medical history  is positive for kyphoplasty, nasal sinus carcinoma, hypertension,  hypothyroidism, osteoporosis and depression. Her depression has done  extremely well recently.   SOCIAL HISTORY:  She is a widower. She does not smoke. She does not drink  any alcohol. She has multiple family members in the area who help provide  her care.   FAMILY HISTORY:  Positive for at least one other family member with severe  osteoporosis.   PHYSICAL EXAMINATION:  GENERAL:  Shows she has a large prosthesis in the  roof of her mouth and in her eye.  VITAL SIGNS:  Her vital signs are as recorded.  HEENT:  Her nose and throat are clear.  NECK:  Supple without masses.  CHEST:  Clear.  HEART:  Regular.  ABDOMEN:  Soft.  EXTREMITIES:  Showed no edema.  CENTRAL NERVOUS SYSTEM:  Grossly intact.   ASSESSMENT:  She is doing much better.   PLAN:  The plan is to continue with her medications and treatments. No  changes today. I will continue with her other medications and follow.      Edward L. Juanetta Gosling, M.D.  Electronically Signed    ELH/MEDQ  D:  07/15/2005  T:  07/16/2005  Job:  [id number]

## 2010-10-05 NOTE — Group Therapy Note (Signed)
NAME:  [name], [name] NO.:  [id number]   MEDICAL RECORD NO.:  [id number]          PATIENT TYPE:  ORB   LOCATION:  S126                          FACILITY:  [facility]   PHYSICIAN:  Edward L. Juanetta Gosling, M.D.DATE OF BIRTH:  1923-01-19   DATE OF PROCEDURE:  DATE OF DISCHARGE:                                   PROGRESS NOTE   [name] says she is doing well.  Her swelling of her legs is much improved  and I suspect that her problem with swelling in legs was related probably to  a venous insufficiency as well as taking new medications.  She has, of  course, had a single episode of tachycardia which was never fully explained.  She has severe osteoporosis which is unchanged.  She now on this  bisphosphonate therapy and that may be part of the reason that she retain  some fluid.  She has depression doing well.  She has had a cancer of the  sinus area and she has ended up in a surgery with an enucleation of the left  eye.  Exam shows that she is sitting up.  She seems to be fairly  comfortable.  Her chest is clear.  Her heart is regular.  Her abdomen is  soft.   ASSESSMENT:  She is doing okay.   PLAN:  Continue with her meds.  Continue with treatments and follow from  there.  I do not plan any changes in her treatment profile today.      Edward L. Juanetta Gosling, M.D.  Electronically Signed     ELH/MEDQ  D:  04/06/2006  T:  04/06/2006  Job:  [id number]

## 2010-10-05 NOTE — Group Therapy Note (Signed)
NAME:  [name], HELDMAN NO.:  [id number]   MEDICAL RECORD NO.:  [id number]           PATIENT TYPE:   LOCATION:                                 FACILITY:   PHYSICIAN:  Edward L. Juanetta Gosling, M.D.     DATE OF BIRTH:   DATE OF PROCEDURE:  08/25/2004  DATE OF DISCHARGE:                                   PROGRESS NOTE   SUBJECTIVE:  [name] says she is doing better.  The area where she has the  patch over her skull defect is less erythematous than the last.  She is  having less of a problem with her back.  She overall feels better.   OBJECTIVE:  HEENT:  Shows that the area is indeed much better.  CHEST:  Clear.  HEART:  Regular.  GENERAL:  She is moving around in a wheelchair so I am pleased with her  improvement.   ASSESSMENT:  She has had an enucleation of the right eye.  She has had some  inflammation in that area.  That is better.  She has a history of  depression, which is much improved.  She has osteoporosis, which is doing  better as well.   PLAN:  I do not plan any changes in her treatments.  I have encouraged her  to get area under her patch out more and expose it some to the air, so it is  not moist.  I think that will help.  She is going to continue with Forteo  and continue all of the other medications and treatments.      ELH/MEDQ  D:  08/27/2004  T:  08/27/2004  Job:  [id number]

## 2010-10-05 NOTE — H&P (Signed)
NAME:  [name], [name]                         ACCOUNT NO.:  [id number]   MEDICAL RECORD NO.:  [id number]                   PATIENT TYPE:  INP   LOCATION:  A309                                 FACILITY:  [facility]   PHYSICIAN:  Edward L. Juanetta Gosling, M.D.             DATE OF BIRTH:  1922-10-14   DATE OF ADMISSION:  12/30/2003  DATE OF DISCHARGE:                                HISTORY & PHYSICAL   REASON FOR ADMISSION:  Compression fracture.   HISTORY OF PRESENT ILLNESS:  This is a 77 year old who has a long known  history of multiple compression fractures in the spine. She has had  compression fractures with very little trauma. She was in her usual state of  fair health at home. She may have fallen, but it is not clear. Her family  found her confused at home and brought her to the emergency room by request.  When she came to the emergency room, she had a CT scan which was essentially  negative. She had an x-ray of her back which showed an increased compression  in L3 which has had a previous kyphoplasty.   PAST MEDICAL HISTORY:  1. Positive for hypertension.  2. Gastroesophageal reflux disease.  3. She has had a carcinoma of the nasal sinuses.  4. 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  5. Removal of hard and soft palate on the left side.  6. She has hypothyroidism.  7. Osteoporosis.   ALLERGIES:  She is allergic to CIPRO and DARVOCET.   MEDICATIONS:  At least at the time of her last hospital discharge in June  were:  1. Colace 100 mg q.12h.  2. Protonix 40 mg daily,  3. Metoprolol XL 25 mg daily.  4. Synthroid 175 mcg daily.  5. Actonel 5 mg daily.  6. Lorazepam 0.5 mg t.i.d. p.r.n.  7. Zofran as needed.  8. Tylenol as needed.  9. Ativan 1 mg q.h.s. as needed.  10.      She has been on Tylox.   She has had increasing problems with confusion. She has also been on Forteo,  an injectable medication for osteoporosis.   SOCIAL HISTORY:  She is just discharged from the [facility]  about a  week ago. She is living with her daughter. She is retired.   FAMILY HISTORY:  Positive for osteoporosis, anxiety, and depression.   REVIEW OF SYSTEMS:  Except as mentioned is essentially negative.   PHYSICAL EXAMINATION:  GENERAL:  Shows a well-developed, well-nourished  female who is complaining of pain. She does have a prosthesis in the left  side of her face.  NECK:  Her neck is supple without masses.  CHEST:  Her chest is fairly clear.  HEART:  Her heart is regular without gallop.  ABDOMEN:  Her abdomen is soft.  EXTREMITIES:  Extremities showed no edema.  VITAL SIGNS:  Her temperature is 99.3, pulse 96, respirations 16, blood  pressure 130/76. O2 saturation is 96%.   Pain is listed as a 7. Her back is mildly tender. Her chest is quite clear.  Heart is regular without gallop. Her abdomen is soft. Extremities showed no  edema.   LABORATORY DATA:  A pH 7.47, pCO2 31, pO2 82. Urinalysis shows negative.  Comprehensive metabolic profile:  Sodium 130, potassium 3.2, otherwise  essentially negative. Her CBC shows white count is 5,900, hemoglobin 12.2.   ASSESSMENT:  She has a L3 compression fracture which is in a vertebrae that  she has already had a kyphoplasty done with. She also seems to have some  retrolisthesis of that vertebral from on L4. She is having considerable  pain. She has had severe osteoporosis, history of hypertension which is  pretty well controlled at this point, hypothyroidism, previous carcinoma of  the soft palate, and she has had depression now, and there is some question  as to whether she is having some confusion _______________ she may have the  beginnings of Alzheimer's disease.   PLAN:  The plan is to go ahead and try to get her comfortable with pain  control. Add Aricept. Continue the Forteo. Morphine for pain as needed. I am  also going to give her Vicodin if she does not need something as strong as  the morphine. I tried to discuss her  situation with Dr. Trey Sailors, but based  on the x-ray done, I do not know if there is going to be a great deal that  he can do as far as any sort of kyphoplasty, etc.     ___________________________________________                                         Oneal Deputy. Juanetta Gosling, M.D.   ELH/MEDQ  D:  12/30/2003  T:  12/30/2003  Job:  [id number]

## 2010-10-05 NOTE — Group Therapy Note (Signed)
NAME:  [name], AKI NO.:  [id number]   MEDICAL RECORD NO.:  [id number]          PATIENT TYPE:  ORB   LOCATION:  S126                          FACILITY:  [facility]   PHYSICIAN:  Edward L. Juanetta Gosling, M.D.DATE OF BIRTH:  [birth year]   DATE OF PROCEDURE:  DATE OF DISCHARGE:                                 PROGRESS NOTE   Ms. Croston is overall about the same.  She continues to have problems  with her nose, particularly on the left side where she has had the  surgery and enucleation.  She has seen the ENT physician, and they  really did not find anything that could be done, but she has been using  saline spray and then having made a great deal of difference as yet.  Examination really about the same.  I do not see any change. Her chest  is pretty clear.  Her heart is regular, and she is overall doing well.  Assessment then is that she has had depression, which is much improved.  He has had the enucleation of her eye, better.  She has still has nasal  problems, which I think are unchanged.  She has osteoporosis, no recent  fracture.  She has done very well with that, and the use of Forteo has  obviously helped her a great deal.  My plan then is to continue with her  medications.  I will discuss again with Dr. Lazarus Salines and see if he has  any other thoughts or anything that we might do to help with her feeling  of congestion, but she has been evaluated there it does not seem to be  any evidence of recurrence of the cancer, etc.      Edward L. Juanetta Gosling, M.D.  Electronically Signed     ELH/MEDQ  D:  02/21/2007  T:  02/22/2007  Job:  [id number]

## 2010-10-05 NOTE — Discharge Summary (Signed)
NAME:  [name], BAMBA NO.:  [id number]   MEDICAL RECORD NO.:  [id number]                   PATIENT TYPE:  INP   LOCATION:  3009                                 FACILITY:  [facility]   PHYSICIAN:  Payton Doughty, M.D.                   DATE OF BIRTH:  10/04/1922   DATE OF ADMISSION:  07/28/2003  DATE OF DISCHARGE:  08/02/2003                                 DISCHARGE SUMMARY   ADMITTING DIAGNOSIS:  T12 compression fracture.   77 year-old, right-handed, white female whose history and  physical is recounted on the chart.  She has had a 7 to 10 day history of  mid to low back pain.  MR showed a T12 fracture.  She came to my office and  was admitted from there.   MEDICAL HISTORY:  1. Hypertension.  2. Reflux.  3. She had a nasal sinus carcinoma resected in the 70s and had a prosthesis     for her palate.   MEDS:  Aciphex, Synthroid, Actonel, Toprol, hydrochlorothiazide.   ALLERGIES:  1. CIPROFLOXACIN.  2. DARVOCET.   SOCIAL HISTORY:  She does not smoke or drink.  Lived in [place].   PAST SURGICAL HISTORY:  Lumbar fusion last summer with pedicle screws from  L4 to S1 related to an L5 compression fracture.   GENERAL EXAM:  At her baseline.  Neurologically, she was intact with  incapacitating back pain.   She was admitted for consultation with Dr. Danielle Dess and Dr. Venetia Maxon.  She  underwent a kyphoplasty at T12 and L1.  She had marked diminution of her  pain.  By July 31, 2003, she was up and slow to mobilize.  PCA was stopped.  Strength was full within the confines of her generally debilitated state.  She was visited by the rehab service on August 02, 2003, who felt that she  was a good candidate.  She was transferred to rehab on August 02, 2003.  Her  followup will be in the [facility] office immediately  after discharge from rehab.   Discharged to rehab August 02, 2003.      Payton Doughty, M.D.    MWR/MEDQ  D:  08/24/2003  T:  08/24/2003  Job:  [id number]

## 2010-10-05 NOTE — Group Therapy Note (Signed)
NAME:  [name], [name]                         ACCOUNT NO.:  [id number]   MEDICAL RECORD NO.:  [id number]                   PATIENT TYPE:  INP   LOCATION:  A309                                 FACILITY:  [facility]   PHYSICIAN:  Edward L. Juanetta Gosling, M.D.             DATE OF BIRTH:  [birth year]   DATE OF PROCEDURE:  01/02/2004  DATE OF DISCHARGE:                                   PROGRESS NOTE   PROBLEM:  Compression fracture.   SUBJECTIVE:  [name] is more confused this morning, I think because she  has received more morphine.  She, otherwise, is about the same.  She has no  new complaints.   PHYSICAL EXAMINATION:  VITAL SIGNS:  Temperature is 98.6, pulse 83,  respirations 24, blood pressure 175/94, O2 saturations 96%.  CHEST:  Clear.  HEART:  Regular.  ABDOMEN:  Soft.  NEUROLOGIC:  As mentioned, she is more confused, but she did receive  morphine, and I suspect that is what is causing this.   ASSESSMENT:  She has continued problems with confusion.   I have discussed everything with her daughter.  Her daughter is at least  considering [facility].  She is going  to check on bed availability.  We will give [name] another dose of  morphine.  I want to go ahead and treat her for her blood pressure and  follow.      ___________________________________________                                            Oneal Deputy Juanetta Gosling, M.D.   ELH/MEDQ  D:  01/02/2004  T:  01/02/2004  Job:  [id number]

## 2010-10-05 NOTE — Group Therapy Note (Signed)
NAME:  [name], [name] NO.:  [id number]   MEDICAL RECORD NO.:  [id number]          PATIENT TYPE:  ORB   LOCATION:                                FACILITY:  [facility]   PHYSICIAN:  Edward L. Juanetta Gosling, M.D.     DATE OF BIRTH:   DATE OF PROCEDURE:  12/29/2004  DATE OF DISCHARGE:                                   PROGRESS NOTE   PROBLEMS:  Osteoporosis and osteoarthritis, status post enucleation left  eye, depression.   SUBJECTIVE:  [name] says [name] is feeling pretty well.  [name] has no new  complaints. The inflammation and irritation around her eye has improved  markedly from her previous situation.  [name] says that [name] is having some back  pain but [name] is concerned about of course, considering her previous history.  [name] says that it does not appear to be as bad as it has in the past when [name]  has had a fracture but [name] wants to keep an eye on this, of course.  Otherwise, [name] says everything is going pretty well.  [name] has no other new  complaints as mentioned.  Her eye looks better.  Her chest is clear.  Heart  is regular.  ABDOMEN:  Soft.  [name] really does not have any spasm in the  back.   ASSESSMENT:  [name] has osteoporosis with multiple previous compression  fractures.  [name] has had an enucleation of her eye.  [name] has depression which  is much improved.  My plan is to continue her current medications and  treatments, will simply observe for now the back problem to see if [name]  improves.  If not, [name] will need x-rays etc..  No other change in her  medicines right now.      Edward L. Juanetta Gosling, M.D.  Electronically Signed     ELH/MEDQ  D:  12/29/2004  T:  12/29/2004  Job:  [id number]

## 2010-10-05 NOTE — Group Therapy Note (Signed)
NAME:  [name], POTVIN NO.:  [id number]   MEDICAL RECORD NO.:  [id number]          PATIENT TYPE:  ORB   LOCATION:  S126                          FACILITY:  [facility]   PHYSICIAN:  Edward L. Juanetta Gosling, M.D.DATE OF BIRTH:  [birth year]   DATE OF PROCEDURE:  04/26/2007  DATE OF DISCHARGE:                                 PROGRESS NOTE   Ms. Boehm is still having some complaints of problems with her nose.  I  had started her on antibiotic because she still had infection.  Now she  is having some nausea which may be related to her antibiotic but is not  totally clear.  There has been some viral nausea and vomiting in the  facility also.  It is not 100% certain that her problem is not that.  Otherwise she is doing okay.  She does not have any other new  complaints.  Her osteoporosis is very well managed now.  She took 2  years of daily injections that she has done beautifully with no more  fractures.  Her depression seems to be doing pretty well.   EXAM:  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.  The site looks okay.  I  do not really see anything in her nose.  Her sinuses are not  particularly tender.  Her abdomen soft.   ASSESSMENT:  She has a probably a sinus infection.  She does have  chronic problems with her nose with previous surgery on her eye.  She  has some nausea which may be related to antibiotics or may be related to  her viral illness.  We are going to simply see how she does for the  moment and keep her on antibiotics and change if needed.      Edward L. Juanetta Gosling, M.D.  Electronically Signed     ELH/MEDQ  D:  05/01/2007  T:  05/01/2007  Job:  [id number]

## 2010-10-05 NOTE — Group Therapy Note (Signed)
NAME:  [name], [name] NO.:  [id number]   MEDICAL RECORD NO.:  [id number]                  PATIENT TYPE:   LOCATION:                                       FACILITY:   PHYSICIAN:  Edward L. Juanetta Gosling, M.D.             DATE OF BIRTH:  [birth year]   DATE OF PROCEDURE:  DATE OF DISCHARGE:                                   PROGRESS NOTE   [facility]  [facility]   SUBJECTIVE:  [name] is overall doing better she says.  She has had severe  back pain, osteoporosis, multiple compression fractures.  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  She has had a tumor in her sinuses.  She has  multiple medical illnesses.  She says she is better today, and indeed she  does look better than the last time that I saw her.  She has no new  complaints.  She has not gotten any fever, chills, nausea or vomiting and  things seem to be going fairly well as far as her overall situation is  concerned.   OBJECTIVE:  Her exam shows that she is much more comfortable than last time.  Her chest is clear.  Her heart is regular.  Her abdomen is soft.   ASSESSMENT:  Overall, she looks a little better.   PLAN:  Continue with her treatments and medications.  I think perhaps the  Forteo is making some difference, and she does not appear to have any new  fractures which of course is good news.  Continue with meds and follow.      ___________________________________________                                            Oneal Deputy Juanetta Gosling, M.D.   ELH/MEDQ  D:  12/04/2003  T:  12/05/2003  Job:  [id number]

## 2010-10-05 NOTE — Group Therapy Note (Signed)
NAME:  TAM, SAVOIA NO.:  [id number]   MEDICAL RECORD NO.:  [id number]         PATIENT TYPE:  PORB   LOCATION:  S126                          FACILITY:  [facility]   PHYSICIAN:  Edward L. Juanetta Gosling, M.D.DATE OF BIRTH:  August 29, 1922   DATE OF PROCEDURE:  DATE OF DISCHARGE:                                   PROGRESS NOTE   HISTORY OF PRESENT ILLNESS:  Ms. Goodin says she is feeling much better.  She  has no new complaints.  After some research, it appears that she needs to  come off of the Forteo, at least short term, and she says she can tolerate  Actonel.  She does not have any other new complaints.  She continues to  still complain of chronic constipation.  She has had depression which is  better.  She has osteoporosis, but has had no new fractures in about a year  and a half since she has been on the Forteo.  She has had an enucleation of  her left eye for cancer.   ASSESSMENT:  She is overall doing better.  She looks better.   PLAN:  The plan is to discontinue Forteo, switch her over to Actonel 35 mg  weekly and see how she does.      Edward L. Juanetta Gosling, M.D.  Electronically Signed     ELH/MEDQ  D:  09/29/2005  T:  09/30/2005  Job:  [id number]

## 2010-10-05 NOTE — Group Therapy Note (Signed)
NAME:  IONNA, AVIS NO.:  [id number]   MEDICAL RECORD NO.:  [id number]          PATIENT TYPE:  ORB   LOCATION:  S126                          FACILITY:  [facility]   PHYSICIAN:  Edward L. Juanetta Gosling, M.D.DATE OF BIRTH:  1922/05/21   DATE OF PROCEDURE:  DATE OF DISCHARGE:                                 PROGRESS NOTE   Ms. Hildebrandt is overall doing, I think a little bit better.  She continues  to complain of problems with her nostril on the left.  Otherwise, she is  doing pretty well.  Depression seems to be okay.  She has not had any  more problems with fractures, which is of course is excellent news.  She  has had the previous enucleation of the left eye, and she has had  significant problem with her sinuses, etc., in that area since, and I am  not sure that what she is having is a some sort of a chronic sinus  problem, but she has been on multiple antibiotics, and although it  clears briefly then it comes right back.   My assessment and plan is that she has severe osteoporosis.  She has had  multiple bouts of infections in her sinus area.  My plan is to have her  go ahead and get a consultation again with Dr. Flo Shanks, who is her  ENT physician.  Otherwise, I am going to plan to continue her treatments  and follow.      Edward L. Juanetta Gosling, M.D.  Electronically Signed     ELH/MEDQ  D:  12/14/2007  T:  12/14/2007  Job:  [id number]

## 2010-10-05 NOTE — Group Therapy Note (Signed)
NAME:  [name], [name] NO.:  [id number]   MEDICAL RECORD NO.:  [id number]          PATIENT TYPE:  ORB   LOCATION:  S126                          FACILITY:  [facility]   PHYSICIAN:  Edward L. Juanetta Gosling, M.D.DATE OF BIRTH:  Aug 29, 1922   DATE OF PROCEDURE:  DATE OF DISCHARGE:                                   PROGRESS NOTE   Ms. Daponte has osteoporosis.  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  She has had gastroesophageal reflux disease.  She has had severe depression  and confusion related to multiple changes.   Her physical exam today shows that she looks about the same.  She is much  improved overall.  She is using her injections for her osteoporosis.  Thus  far, she has had no new problems noted.  Her exam today shows that her chest  is quite clear.  Her heart is regular.  Her abdomen is soft.  Extremities  showed no edema.  Overall, things seem to be improving.   My plan then is no changes today and follow.     Edwa   ELH/MEDQ  D:  03/28/2004  T:  03/28/2004  Job:  [id number]

## 2010-10-05 NOTE — Group Therapy Note (Signed)
NAME:  CARLO, LORSON NO.:  [id number]   MEDICAL RECORD NO.:  [id number]         PATIENT TYPE:  PORB   LOCATION:                                FACILITY:  [facility]   PHYSICIAN:  Edward L. Juanetta Gosling, M.D.     DATE OF BIRTH:   DATE OF PROCEDURE:  11/11/2004  DATE OF DISCHARGE:                                   PROGRESS NOTE   PROBLEMS:  1.  Status post enucleation of her left eye with sinus surgery as well.  2.  Osteoporosis.  3.  Depression.   SUBJECTIVE:  Ms. Sacca overall seems to be doing better.  Her depression is  clearly better. She is more  alert.  Her appetite has been better, and she  has gained some weight.  She had lost a significant amount of weight  originally when she got sick.  She has had no more fractures.  Her Forteo  seems to be working well as her osteoporosis.   PHYSICAL EXAMINATION:  Exam shows her chest is clear.  Her heart is regular.  Her abdomen is soft.   ASSESSMENT:  She seems to doing well.   PLAN:  The plan is to continue treatments and follow.  No changes.       ELH/MEDQ  D:  11/11/2004  T:  11/12/2004  Job:  [id number]

## 2010-10-05 NOTE — Group Therapy Note (Signed)
NAME:  [name], GOTO NO.:  [id number]   MEDICAL RECORD NO.:  [id number]          PATIENT TYPE:  ORB   LOCATION:  S126                          FACILITY:  [facility]   PHYSICIAN:  Edward L. Juanetta Gosling, M.D.DATE OF BIRTH:  12-22-1922   DATE OF PROCEDURE:  05/28/2006  DATE OF DISCHARGE:                                 PROGRESS NOTE   [name] had more trouble with cough and congestion and what she felt  was a sinus infection but she is doing better.  Her concern is that  initially when she had the tumor in her eye area that eventually cause  her to have an enucleation of her eye, she has had of symptoms similar  to this.  She is better now, however.  She has also got a rash around  her eye where she puts on the patch to cover the eye socket.  This may  be from tape.  She has not had any more fractures.  She said she had  coughed a lot and she is pleased that she did not have any fractures  from that and I am as well.  Her exam otherwise showed her chest is  actually quite clear.  Her vitals are as recorded.  Her sinuses are  slightly tender.   ASSESSMENT:  1. She has had a sinus infection which is improving but not gone.  2. She has severe osteoporosis with multiple fractures, none in the      last year or so.  3. She has had an enucleation of her left eye for cancer.  4. She has depression which is much improved.  5. She has had some swelling of her feet which I think is related to      her bisphosphonate and overall things are better.   PLAN:  Continue with her medications, continue with her treatments and  follow.      Edward L. Juanetta Gosling, M.D.  Electronically Signed     ELH/MEDQ  D:  05/28/2006  T:  05/28/2006  Job:  [id number]

## 2010-10-05 NOTE — Group Therapy Note (Signed)
NAME:  [name], [name] NO.:  [id number]   MEDICAL RECORD NO.:  [id number]          PATIENT TYPE:  ORB   LOCATION:  S126                          FACILITY:  [facility]   PHYSICIAN:  Edward L. Juanetta Gosling, M.D.DATE OF BIRTH:  1922/07/20   DATE OF PROCEDURE:  02/25/2004  DATE OF DISCHARGE:                                   PROGRESS NOTE   PROBLEM:  1.  Severe osteoporosis.  2.  Depression.  3.  Status post enucleation for cancer.   SUBJECTIVE:  Ms. Lottman says she is feeling better.  She is able to ambulate  to some extent.  She is using a brace while she does.  She has no other new  complaints.  She is tolerating the Forteo well.   PHYSICAL EXAMINATION:  CHEST:  Clear.  HEART:  Regular.  ABDOMEN:  Soft.  EXTREMITIES:  No edema.  BACK:  She is much less tender in her back.   ASSESSMENT:  She is better.   PLAN:  Continue treatments and follow.  No changes today.  Continue with  physical therapy, etc., try to get her up and moving around.     Edwa   ELH/MEDQ  D:  02/25/2004  T:  02/25/2004  Job:  [id number]

## 2010-10-05 NOTE — Group Therapy Note (Signed)
NAME:  [name], [name] NO.:  [id number]   MEDICAL RECORD NO.:  [id number]          PATIENT TYPE:  ORB   LOCATION:  S126                          FACILITY:  [facility]   PHYSICIAN:  Edward L. Juanetta Gosling, M.D.DATE OF BIRTH:  January 15, 1923   DATE OF PROCEDURE:  08/21/2007  DATE OF DISCHARGE:                                 PROGRESS NOTE   SUBJECTIVE:  [name] says she is doing okay but she has a cough.  She  is otherwise doing about the same with no other new complaints.   PHYSICAL EXAMINATION:  CHEST:  Today shows her chest is relatively  clear.  She does not have any wheezing.  HEART:  Regular.  ABDOMEN:  Soft.  EXTREMITIES:  Showed no edema.   ASSESSMENT:  She has osteoporosis which is stable.  She has what appears  to be an acute bronchitis.  I think that is doing fairly well.  She does  have a cough.  She has had a tumor in her sinus with and enucleation of  her left eye, that seems stable.  She still has chronic nasal  complaints.  That is doing okay, and otherwise, I am going to plan to  continue with treatment of these medications, just leave her on the  antibiotics and follow.      Edward L. Juanetta Gosling, M.D.  Electronically Signed     ELH/MEDQ  D:  08/21/2007  T:  08/21/2007  Job:  [id number]

## 2010-10-05 NOTE — Group Therapy Note (Signed)
NAME:  [name], [name] NO.:  [id number]   MEDICAL RECORD NO.:  [id number]          PATIENT TYPE:  ORB   LOCATION:  S126                          FACILITY:  [facility]   PHYSICIAN:  Edward L. Juanetta Gosling, M.D.DATE OF BIRTH:  [birth year]   DATE OF PROCEDURE:  04/28/2006  DATE OF DISCHARGE:                                 PROGRESS NOTE   [name] is about the same. She has got some cough and congestion and  has a sore throat.  She is still having fluid in her legs.  She is  frustrated by needing to use the compression stockings, but I think it  is simply a choice between that or swelling, and she is wants to use  compression stockings in that situation.  Her examination shows that her  sinuses are slightly inflamed.  Her throat is erythematous.  Chest is  clear.  Heart is regular. Abdomen is soft.   ASSESSMENT:  She has severe osteoporosis.  She has bilateral leg edema  which I think is related to the use of Fosamax.  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  She has a sinus infection today.  She has  osteoarthritis.  She has depression which is much better.  My plan then  is to continue with treatments.  She has already started an antibiotic  abd we will see how she does.      Edward L. Juanetta Gosling, M.D.  Electronically Signed     ELH/MEDQ  D:  04/28/2006  T:  04/28/2006  Job:  [id number]

## 2010-10-05 NOTE — Group Therapy Note (Signed)
NAME:  [name], BUCHTA NO.:  [id number]   MEDICAL RECORD NO.:  [id number]          PATIENT TYPE:  ORB   LOCATION:  S126                          FACILITY:  [facility]   PHYSICIAN:  Edward L. Juanetta Gosling, M.D.DATE OF BIRTH:  07-19-1922   DATE OF PROCEDURE:  DATE OF DISCHARGE:                                 PROGRESS NOTE   PROBLEMS:  Include:  1. Osteoporosis with multiple fractures.  2. 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   SUBJECTIVE:  [name] has no changes.  She is doing about the same.  She has no new complaints.   PHYSICAL EXAMINATION:  CHEST:  Is pretty clear.  HEART:  Regular.  She is moving around.  She is up and alert.   ASSESSMENT:  She is stable.   PLAN:  Continue with her current treatment and medications.  No changes  today.  She is now about 2 years since her last fracture.      Edward L. Juanetta Gosling, M.D.  Electronically Signed     ELH/MEDQ  D:  09/06/2006  T:  09/06/2006  Job:  [id number]

## 2010-10-05 NOTE — Group Therapy Note (Signed)
NAME:  [name], GUZZETTA NO.:  [id number]   MEDICAL RECORD NO.:  [id number]          PATIENT TYPE:  ORB   LOCATION:  S126                          FACILITY:  [facility]   PHYSICIAN:  Edward L. Juanetta Gosling, M.D.DATE OF BIRTH:  07-10-1922   DATE OF PROCEDURE:  05/03/2004  DATE OF DISCHARGE:                                   PROGRESS NOTE   Ms Min has osteoporosis.  She has had cancer in the nasal passage which  required an enucleation of her left eye.  She has severe osteoporosis.  She  is currently on Forteo.  She has also had problems with chronic urinary  tract infection and recently has had an upper respiratory infection that  resulted in a large swollen lymph node on the left side of her face.  She  says that she feels better today.  Her lymph node is not as bad.  She is not  having as much problem.  She says that her back is better which is quite  excellent considering the amount of difficulty that she has had with her  osteoporosis.  She is not having any other new complaints or problems.   Her exam shows her face is pretty much unchanged.  She does have a lymph  node that is palpable in the left side of her face beneath the jaw, but it  is not as inflamed as earlier.  Her chest is clear.  Her heart is regular.  She is using the wheelchair.   ASSESSMENT:  I am pleased that she seems to be getting better as far as her  osteoporosis is concerned.  I believe the [place] is working.  I think that  after my next visit she will be ready to start physical therapy.  She has  been afraid to do that at this point.  Otherwise, we will continue with her  treatment.  She is going to finish up her antibiotic today, and then we will  follow from there.  No other new medicines or treatment today.     Edwa   ELH/MEDQ  D:  05/03/2004  T:  05/03/2004  Job:  [id number]

## 2010-10-05 NOTE — Group Therapy Note (Signed)
NAME:  LEELOO, SILVERTHORNE NO.:  [id number]   MEDICAL RECORD NO.:  [id number]          PATIENT TYPE:  ORB   LOCATION:  S126                          FACILITY:  [facility]   PHYSICIAN:  Edward L. Juanetta Gosling, M.D.DATE OF BIRTH:  Dec 17, 1922   DATE OF PROCEDURE:  10/23/2006  DATE OF DISCHARGE:                                 PROGRESS NOTE   Ms. Klinkner has had more problems with her eye socket area.  She has had  some infection in that area.  Otherwise, she is doing pretty well.  Depression is well controlled.  Her osteoporosis is doing well.  She has  not had any more fractures of any kind, and everything seems to be going  okay.   PLAN:  She is on antibiotics already.  I started that.  She does have  the problem with her eye.  Her osteoporosis is well controlled.  The  depression is doing okay.  She has had some edema of her leg.  That  actually is doing very well with use of compression stockings.  I do not  have to change any meds now and I will continue with her treatment and  followup.      Edward L. Juanetta Gosling, M.D.  Electronically Signed     ELH/MEDQ  D:  10/25/2006  T:  10/25/2006  Job:  [id number]

## 2010-10-05 NOTE — Consult Note (Signed)
   NAME:  [name], DELCONTE                         ACCOUNT NO.:  [id number]   MEDICAL RECORD NO.:  [id number]                   PATIENT TYPE:  INP   LOCATION:  A325                                 FACILITY:  [facility]   PHYSICIAN:  J. Darreld Mclean, M.D.              DATE OF BIRTH:  08/29/1922   DATE OF CONSULTATION:  09/28/2002  DATE OF DISCHARGE:                                   CONSULTATION   REFERRING PHYSICIAN:  Patrica Duel, M.D.   HISTORY:  [name] is a 77 year old female with a history of intractable  back pain that began last week and had gotten progressively worse.  She is  now admitted because of severe pain.  On prednisone the last 24 hours and  has had improvement.  Most of her pain is located to the left lower back.  Previous history of radiation down past her knee into her calf but today  said the pain is less, and most of the pain is located to the left lower  back.  She has some tightness there but no spasms.  Reflexes are good.  Straight leg raising is positive.  Neurologically intact.  There is slight  tenderness over the trochanteric area of the hip.  No effusion to the knee,  and there is no edema to the leg.   Patient had an MRI yesterday.  We went to radiology, and after a brief  search, we found out she had had the MRI, but it had not been read.  Originally, it had been keyed in that it had been read, but it had not been  read, and the radiologist will read it shortly.  I would like to evaluate  the MRI first before making a final impression and evaluation.  We will  check back with radiology later today about the MRI.  She had an MRI of the  hip, MRI of the lower back.  They found the MRI of the hip and are trying to  locate the films of the back at the present time.   Continue bedrest.  Continue present medicines for present.                                               Teola Bradley, M.D.    JWK/MEDQ  D:  09/28/2002  T:  09/28/2002  Job:  [id number]

## 2010-10-05 NOTE — Group Therapy Note (Signed)
NAME:  HETAL, PROANO                         ACCOUNT NO.:  [id number]   MEDICAL RECORD NO.:  [id number]                   PATIENT TYPE:  ORB   LOCATION:  S135                                 FACILITY:  [facility]   PHYSICIAN:  Edward L. Juanetta Gosling, M.D.             DATE OF BIRTH:  1922/10/19   DATE OF PROCEDURE:  DATE OF DISCHARGE:                                   PROGRESS NOTE   This is a new patient to me who has a history of severe osteoporosis who has  had some sort of her tumor in her sinus and who has had an enucleation of  her left eye.  She has had multiple fractures in her spine and has ended up  having to be admitted to the skilled care facility because of fractures and  inability to walk.  She said that she did get up some today with assistance  and feels a little better. She is having some more back pain, however.   OBJECTIVE:  Her exam shows that her chest is pretty clear.  Her heart is  regular.  She appears to be, in general, a chronically ill female who is in  no acute distress now.   ASSESSMENT:  My assessment is that she has had multiple fractures. She has  been on Actonel for some time.  I think that we should try to add Forteo.  She is back on calcium and vitamin D which she had been off of, apparently.   PLAN:  I do not plan any new treatments today.  We will simply continue to  follow, except as mentioned.      ___________________________________________                                            Oneal Deputy Juanetta Gosling, M.D.   ELH/MEDQ  D:  11/12/2003  T:  11/12/2003  Job:  [id number]

## 2010-10-05 NOTE — Group Therapy Note (Signed)
NAME:  [name], [name] NO.:  [id number]   MEDICAL RECORD NO.:  [id number]          PATIENT TYPE:  ORB   LOCATION:  S126                          FACILITY:  [facility]   PHYSICIAN:  Edward L. Juanetta Gosling, M.D.DATE OF BIRTH:  1923-04-05   DATE OF PROCEDURE:  06/29/2004  DATE OF DISCHARGE:                                   PROGRESS NOTE   SUBJECTIVE:  [name] says she is doing fairly well.  She has developed a  new problem which is that she has a sensation that her legs are somewhat  asleep or numb, it is difficult for her to describe.  At any rate, this was  something of a new sensation for her.  It is in both legs, it starts at the  knees and goes up.   PHYSICAL EXAMINATION:  CHEST:  Clear.  She is standing upright with using a  brace which is excellent.  Her chest is pretty clear.  NEUROLOGIC:  She is intact.   ASSESSMENT:  She has an odd sensation in her legs.  Certainly this could be  related to compression of the nerve roots because of her multiple previous  spinal fractures and she does say that it gets better when she takes  medications for anxiety which may help some with muscle relaxation as well.  My assessment is that she is overall a little bit better.   PLAN:  I have added Vistaril to her previous medications to see if it makes  any difference at all.  She is going to take that on a p.r.n. basis.  Otherwise her depression is much improved.  She overall seems to be doing  better.  As far as her sinus cancer there is no sign of that.  She has had  no further fractures.  She is doing extremely well on her current  medications.      ELH/MEDQ  D:  06/29/2004  T:  06/29/2004  Job:  [id number]

## 2010-10-05 NOTE — H&P (Signed)
NAME:  [name], [name] NO.:  [id number]   MEDICAL RECORD NO.:  [id number]                   PATIENT TYPE:  INP   LOCATION:  3009                                 FACILITY:  [facility]   PHYSICIAN:  Payton Doughty, M.D.                   DATE OF BIRTH:  [birth year]   DATE OF ADMISSION:  07/28/2003  DATE OF DISCHARGE:                                HISTORY & PHYSICAL   ADMISSION DIAGNOSIS:  T12 compression fracture.   HISTORY OF PRESENT ILLNESS:  A 77 year old, right-handed white female with 7  to 10-day history of mid to mid low back pain which has been incapacitating  over the past 8 days, not out of bed.  She had an MRI done up in [place]  that demonstrated T12 compression fracture. She was supposed to come to my  office today.  The family has not been able to get her out of bed and  arranged for ambulance transfer down here to [facility].  She has  complaints of pain in her back with movement.  She has minimal radicular  findings except a bit of heaviness to the left foot.   PAST MEDICAL HISTORY:  Remarkable for a bit of hypertension and reflux.  She  has also had a carcinoma of the nasal sinuses resected and has had resection  of an eye and her hard and soft palate on the left side.  This was done in  the 1970s.  She has worn an oral prosthesis ever since then.   MEDICATIONS:  1. Aciphex 20 mg a day.  2. Synthroid 0.015 mg a day.  3. Actonel 5 mg a day.  4. Toprol XL 100 mg a day.  5. Hydrochlorothiazide 25 mg a day.  6. Centrum Silver.  7. Caltrate.  8. Citrucel.  9. Percocet on a p.r.n. basis.   ALLERGIES:  She is allergic to CIPRO, DARVOCET.   SOCIAL HISTORY:  She neither smokes nor drinks.  She is retired and lives in  [place].   PAST SURGICAL HISTORY:  She had an L5 compression fracture last summer which  was stabilized by me with pedicle screws from L4 to S1.   PHYSICAL EXAMINATION:  HEENT:  Prosthesis present in the left side of  her  face.  NECK:  Supple, no lymphadenopathy.  CHEST:  Clear.  CARDIAC:  Regular rate and rhythm.  NEUROLOGIC:  She is awake, alert, and oriented.  Her cranial nerves in the  right eye are intact.  Facial movement and sensation are intact.  Tongue is  in the midline.  Shoulder shrug is normal.  Motor exam demonstrates 5/5  strength throughout the upper and lower extremities except is pain limited  from her mid back.  Reflexes are 2 at the knees, 2 at the ankles, toes  downgoing bilaterally.  There is no clonus. She has pain to palpation  direction  over the T12 vertebrae.   CLINICAL IMPRESSION:  T12 compression fracture.   She is incapacitated with this and needs to get some kind of stabilization  so she can get up and about.  Discussed this with Dr. Danielle Dess, and we are  going to try to get a kyphoplasty done for her tomorrow.                                                Payton Doughty, M.D.    MWR/MEDQ  D:  07/28/2003  T:  07/29/2003  Job:  [id number]

## 2010-10-05 NOTE — Group Therapy Note (Signed)
NAME:  [name], [name] NO.:  [id number]   MEDICAL RECORD NO.:  [id number]          PATIENT TYPE:  ORB   LOCATION:  S126                          FACILITY:  [facility]   PHYSICIAN:  Edward L. Juanetta Gosling, M.D.DATE OF BIRTH:  10-Mar-1923   DATE OF PROCEDURE:  DATE OF DISCHARGE:                                 PROGRESS NOTE   Ms. Buskey is doing okay.  She has no new complaints.  She is having a  sense of fullness in her left side of her nostril, and she is concerned  about that because that was where she had her initial onset of her tumor  in her eye.  Otherwise, she is doing okay.  She has had a cellulitis of  the eye cavity again.  Everything else is about the same.  She has no  other complaints at all.   Her exam shows that she is in a wheelchair.  She looks comfortable.  Her  eye looks okay.  I cannot see anything in her nose.  Her chest is clear.   ASSESSMENT:  She has had a tumor in her nose that required enucleation  of her left eye and now with recurrent symptoms.  She is going to see  Dr. Lazarus Salines about this.  She has osteoporosis - severe.  She has  multiple fractures but none recently.  She has mild depression which is  doing okay, and my plan is for her to continue on her meds and  treatments.      Edward L. Juanetta Gosling, M.D.  Electronically Signed     ELH/MEDQ  D:  09/28/2006  T:  09/29/2006  Job:  [id number]

## 2010-10-05 NOTE — Discharge Summary (Signed)
NAME:  [name], [name] NO.:  [id number]   MEDICAL RECORD NO.:  [id number]                   PATIENT TYPE:  IPS   LOCATION:  4147                                 FACILITY:  [facility]   PHYSICIAN:  Ellwood Dense, M.D.                DATE OF BIRTH:   DATE OF ADMISSION:  10/07/2002  DATE OF DISCHARGE:  10/18/2002                                 DISCHARGE SUMMARY   DISCHARGE DIAGNOSES:  1. Lumbar 5 compression fracture, status post lumbar laminectomy,     decompression of L5, and arthrodesis Oct 01, 2002.  2. Pain management.  3. Hypertension.  4. Hypothyroidism.   HISTORY OF PRESENT ILLNESS:  This 77 year old female admitted to [facility] Sep 29, 2002 after transfer from [facility] with  left lower extremity pain x7 days.  No trauma.  Upon evaluation, x-rays and  imaging showed a lumbar L5 compression fracture with retropulsion bone  fragment and a lumbar L5 radiculopathy.  Underwent lumbar L5 laminectomy,  decompression of L5 root with pedicle screw at L4-L5 and posterolateral  arthrodesis of L4-L5 and L5-S1 per Dr. Channing Mutters on Oct 01, 2002.  Fitted with a  TSLO brace, pain management ongoing, Foley catheter removed.  She is total  assist, bed mobility.  Latest chemistry is unremarkable except a sodium of  127.  She was admitted for comprehensive rehab program.   PAST MEDICAL HISTORY:  1. Hypertension.  2. Hypothyroidism.  3. Bronchitis.  4. Left sinus tumor with eye removed.   MEDICATIONS:  Prior to admission were Toprol, Aciphex, Synthroid,  hydrochlorothiazide, and Actonel.   ALLERGIES:  CIPRO.  PROPOXYPHENE.  VIBRAMYCIN.   No alcohol and tobacco.  Her primary M.D. is Dr. Patrica Duel.   SOCIAL HISTORY:  Lives with daughter in one level apartment.  No steps to  entry.  Used a cane at times.  Still driving.  Daughter local, and daughter  also in area, both work.   HOSPITAL COURSE:  The patient did well on  rehabilitation services with  therapies initiated on a b.i.d. basis.  The following issues are followed  during patient's rehab course.  Pertaining to [name]'s lumbar compression  fracture with lumbar laminectomy, L5 decompression - surgical site healing  nicely.  Staples have been removed.  Back brace when out of bed.  Neurovascular sensation remained intact.  Pain management ongoing with the  use of Tylox and good results.  Blood pressure is controlled on Toprol and  hydrochlorothiazide.  She remained on her hormone supplement for  hypothyroidism.  She had mild hyponatremia, since improved to 132 and  monitored.  She had no bowel or bladder disturbances.  She continued on her  Aciphex as prior to hospital admission.  She was independent in her room  with a rolling walker.  Simple set up for activities of daily living.  Home  health therapy has  been arranged with [place].  She will follow up with Dr.  Trey Sailors, neurosurgery, in two weeks.   DISCHARGE MEDICATIONS:  Included:  1. Toprol XL 100 mg daily.  2. Synthroid 150 micrograms daily.  3. Hydrochlorothiazide 25 mg daily.  4. Os-Cal 500 mg twice daily.  5. Aciphex 20 mg daily.  6. Tylox as needed for pain.   ACTIVITY:  As tolerated with back brace when out of bed.   DIET:  Regular.   WOUND CARE:  Cleanse incision daily with warm soap and water.   SPECIAL INSTRUCTIONS:  Home health, physical, and occupational therapy.  She  should follow up with Dr. Trey Sailors, neurosurgery, in 2 weeks.  Dr. Nobie Putnam,  medical management.     Mariam Dollar, P.A.                     Ellwood Dense, M.D.    DA/MEDQ  D:  10/18/2002  T:  10/18/2002  Job:  [id number]   cc:   Ellwood Dense, M.D.  [address] [place] [address]  [place] [address]  Fax: [phone]   Payton Doughty, M.D.  [address]  [place] [address]  Fax: [phone]   Patrica Duel, M.D.  [address]  [place]  [place] [address]  Fax: [phone]

## 2010-10-05 NOTE — Discharge Summary (Signed)
NAME:  [name], [name]                         ACCOUNT NO.:  [id number]   MEDICAL RECORD NO.:  [id number]                   PATIENT TYPE:  INP   LOCATION:  A309                                 FACILITY:  [facility]   PHYSICIAN:  Edward L. Juanetta Gosling, M.D.             DATE OF BIRTH:  October 21, 1922   DATE OF ADMISSION:  12/30/2003  DATE OF DISCHARGE:  01/02/2004                                 DISCHARGE SUMMARY   DISCHARGE DIAGNOSES:  1. Compression fracture, L3.  2. Multiple previous compression fractures.  3. Hypertension.  4. Gastroesophageal reflux disease.  5. History of carcinoma of the nasal sinuses with enucleation of the left     eye.  6. Hypothyroidism.  7. Osteoporosis.  8. Confusion.  9. Dementia.  10.      Hyponatremia.  11.      Hypokalemia.   HISTORY:  [name] is a 77 year old with a long known history of  osteoporosis and multiple compression fractures in her spine.  She has had  compression fractures with very little trauma, has had multiple  kyphoplasties done.  She was in her usual state of fair health at home.  She  was confused.  It is not clear if she fell or exactly what happened, but she  was brought to the emergency room.  In the emergency room, she had a CT scan  of the brain which was negative.  X-ray of her back which showed increased  compression in L3 which had a previous kyphoplasty.   PHYSICAL EXAMINATION:  GENERAL:  A well-developed, well-nourished female  with a prosthesis in the left side of her face.  NECK:  Supple.  CHEST:  Clear.  HEART:  Regular.  ABDOMEN:  Soft.  EXTREMITIES:  No edema.  VITAL SIGNS:  Temperature was 99.3, pulse 96, respirations 16, blood  pressure 130/76, O2 saturation 96%.  BACK:  Mildly tender.   LABORATORY DATA:  Her pH was 7.47, PCO2 of 31, PO2 of 82.  Urinalysis was  negative.  Comprehensive metabolic profile showed a sodium of 130, potassium  3.2.   ASSESSMENT:  She had a L3 compression fracture.   HOSPITAL COURSE:   She was treated with intravenous pain medications, had  compresses on her back, and improved, but still was having pain.  She is  discharged in improved condition to have PT consultation on Tylox one q.4h.  p.r.n. pain, Miacalcin nasal spray one inhalation daily, calcium carbonate  t.i.d., Colace 100 mg b.i.d., Aricept 5 mg daily, Duragesic patch 25 mcg  daily, Synthroid 175 daily, Toprol 25 mg daily, Protonix 40 mg daily, Paxil  20 mg daily, Tylenol 1000 mg q.4h. p.r.n., Chronulac 30 cc q.12h. p.r.n.,  Ativan 0.5 mg q.4h. p.r.n. pain, and Forteo injectable for her osteoporosis.     ___________________________________________  Edward L. Juanetta Gosling, M.D.   ELH/MEDQ  D:  01/02/2004  T:  01/02/2004  Job:  [id number]

## 2010-10-05 NOTE — Group Therapy Note (Signed)
NAME:  TEHILLA, COFFEL NO.:  [id number]   MEDICAL RECORD NO.:  [id number]          PATIENT TYPE:  ORB   LOCATION:  S126                          FACILITY:  [facility]   PHYSICIAN:  Edward L. Juanetta Gosling, M.D.DATE OF BIRTH:  November 30, 1922   DATE OF PROCEDURE:  DATE OF DISCHARGE:                                 PROGRESS NOTE   Problems include osteoporosis, status post multiple fractures, status  post enucleation of the left eye for cancer, chronic constipation, and  depression.   SUBJECTIVE:  Ms. Stoneberg says she is feeling much better.  She has no new  complaints and no new problems except she has some constipation that is  mostly at night.   Her physical examination now, her chest is clear.  Her sinuses are  nontender.  Her heart is regular.   Assessment is that she has multiple medical problems including the  depression, which seems to be better.  For the osteoporosis, she has  taken Forteo and had no fractures recently.  She does have some chronic  constipation.  I am not sure that we are going to be able to make much  difference to that.  When she takes something for constipation, she  seems to have more problems with diarrhea.   So, my plan is for her to continue on her medications and treatments and  I will see her back here to see how she does with the constipation.  If  it does not get better, then I will need to alter her medications.      Edward L. Juanetta Gosling, M.D.  Electronically Signed     ELH/MEDQ  D:  09/24/2007  T:  09/24/2007  Job:  [id number]

## 2010-10-05 NOTE — Group Therapy Note (Signed)
NAME:  [name], [name] NO.:  [id number]   MEDICAL RECORD NO.:  [id number]          PATIENT TYPE:  ORB   LOCATION:  S126                          FACILITY:  [facility]   PHYSICIAN:  Edward L. Juanetta Gosling, M.D.DATE OF BIRTH:  [birth year]   DATE OF PROCEDURE:  02/15/2005  DATE OF DISCHARGE:                                   PROGRESS NOTE   SUBJECTIVE:  [name] is having some problems with her sinuses again.  She  says that it feels like she has got a sinus infection.  She is of course at  great risk for sinus infections because of her previous surgery.  Otherwise,  she says she is doing okay.  She has had no symptoms of fractures so it  appears that her current treatment is working well for her osteoporosis.   OBJECTIVE:  GENERAL:  Her exam shows her she looks fairly comfortable.  VITAL SIGNS:  As recorded.   ASSESSMENT:  She probably has a sinus infection.   PLAN:  Continue with her antibiotics that have already been started.  Continue her other treatments and medications.  Continue Forteo for her  osteoporosis and follow.  She does not seem to be having significant  problems with depression now.      Edward L. Juanetta Gosling, M.D.  Electronically Signed     ELH/MEDQ  D:  02/15/2005  T:  02/15/2005  Job:  [id number]

## 2010-10-05 NOTE — Group Therapy Note (Signed)
NAME:  ALEECIA, TAPIA NO.:  [id number]   MEDICAL RECORD NO.:  [id number]          PATIENT TYPE:  ORB   LOCATION:  S126                          FACILITY:  [facility]   PHYSICIAN:  Edward L. Juanetta Gosling, M.D.DATE OF BIRTH:  [birth year]   DATE OF PROCEDURE:  DATE OF DISCHARGE:                                 PROGRESS NOTE   PROBLEMS:  Previous sinus carcinoma, osteoporosis, depression.   SUBJECTIVE:  Ms. Castell says she is feeling much better. She still has  some congestion in her nose, but otherwise she is doing very well. She  has no new complaints.   EXAMINATION:  Her exam today shows that her chest is much clearer  __________ she is in a wheelchair. Her nose looks okay, but we are  waiting to see if she gets improvement in her feeling of congestion. She  is using saline nasal spray and she will probably need to have a repeat  ENT evaluation if she does not get better. Otherwise she is going to  continue on her treatment. She is doing very well. She has had no more  fractures in some time.   PLAN:  Continue treatments and follow. She is going to let me know if  she does not improve as far as her nasal congestion symptoms.      Edward L. Juanetta Gosling, M.D.  Electronically Signed     ELH/MEDQ  D:  04/04/2007  T:  04/05/2007  Job:  [id number]

## 2010-10-05 NOTE — Group Therapy Note (Signed)
NAME:  [name], [name] NO.:  [id number]   MEDICAL RECORD NO.:  [id number]          PATIENT TYPE:  ORB   LOCATION:  S126                          FACILITY:  [facility]   PHYSICIAN:  Edward L. Juanetta Gosling, M.D.DATE OF BIRTH:  09/22/1922   DATE OF PROCEDURE:  DATE OF DISCHARGE:                                 PROGRESS NOTE   Ms. Goodgame is overall about the same.  She continues to be having some  problems with constipation.  She has of course osteoporosis.  She  has  had multiple fractures but none recently.  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  She has had chronic  sinus congestion.  It seems to be doing pretty well.   PHYSICAL EXAMINATION:  VITAL SIGNS:  Vitals were as recorded.  CHEST:  Pretty clear.  HEART:  Regular.   ASSESSMENT:  She is better.   PLAN:  Plan is to continue with treatments and medications and follow.      Edward L. Juanetta Gosling, M.D.  Electronically Signed     ELH/MEDQ  D:  10/25/2007  T:  10/25/2007  Job:  [id number]

## 2010-10-05 NOTE — Group Therapy Note (Signed)
NAME:  [name], [name] NO.:  [id number]   MEDICAL RECORD NO.:  [id number]          PATIENT TYPE:  EMS   LOCATION:  ED                            FACILITY:  [facility]   PHYSICIAN:  Edward L. Juanetta Gosling, M.D.DATE OF BIRTH:  [birth year]   DATE OF PROCEDURE:  DATE OF DISCHARGE:  12/13/2005                                   PROGRESS NOTE   PROBLEMS:  Include:  1. Episode of tachycardia, not recurrent.  2. Osteoporosis  3. Depression.  4. Status post multiple fractures.   HISTORY:  Ms. Bigbee seems to be doing very well.  She has no other new  complaints.  Everything is going okay.  As far as her enucleated eye and  sinus surgery, she seems to be doing well.  She has no new changes.  There  has been no further episodes of tachycardia.   ASSESSMENT:  She seems to be doing okay.   PLAN:  Is to continue with her current treatments and medication.  I do not  plan to change anything today.      Edward L. Juanetta Gosling, M.D.  Electronically Signed     ELH/MEDQ  D:  01/25/2006  T:  01/26/2006  Job:  [id number]

## 2010-10-05 NOTE — Group Therapy Note (Signed)
NAME:  NOELL, LORENSEN NO.:  [id number]   MEDICAL RECORD NO.:  [id number]          PATIENT TYPE:  ORB   LOCATION:  S126                          FACILITY:  [facility]   PHYSICIAN:  Edward L. Juanetta Gosling, M.D.DATE OF BIRTH:  July 25, 1922   DATE OF PROCEDURE:  DATE OF DISCHARGE:                                 PROGRESS NOTE   Ms. Geno is much better.  She says that her face is better.  She is not  having much discomfort in her nose.  She feels better in general and has  no new complaints.   PHYSICAL EXAMINATION TODAY:  CHEST: is pretty clear.  HEART:  Regular.  ABDOMEN:  Soft.   She is up and participating in some group activities, so I am very  pleased with that.   ASSESSMENT:  She is improved.   PLAN:  Continue treatments and follow.  She of course has depression  which is stable.  She has had cancer in the sinus with enucleation of  her eye, and that is doing okay.  She has osteoporosis, which is stable.  My plan then is for her to continue on her treatments and medications  and follow.      Edward L. Juanetta Gosling, M.D.  Electronically Signed     ELH/MEDQ  D:  12/28/2006  T:  12/29/2006  Job:  [id number]

## 2010-10-05 NOTE — Group Therapy Note (Signed)
NAME:  [name], [name] NO.:  [id number]   MEDICAL RECORD NO.:  [id number]          PATIENT TYPE:  ORB   LOCATION:  S126                          FACILITY:  [facility]   PHYSICIAN:  Edward L. Juanetta Gosling, M.D.DATE OF BIRTH:  10-Dec-1922   DATE OF PROCEDURE:  DATE OF DISCHARGE:                                 PROGRESS NOTE   Ms. Badilla is a little better.  She says her nose is better.  She has no  new problems.  She is not having any difficulty with her back or any  other arthritic complaints as far as she can tell.   PHYSICAL EXAMINATION:  CHEST:  Her exam shows that her chest is clear.  HEART:  Regular.  EYE: The area of her eye is unchanged.   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 She has osteoporosis which is stable and I do not plan to  change any meds today and I will continue with her other treatments.  She is happy with how her nose is doing now.      Edward L. Juanetta Gosling, M.D.  Electronically Signed     ELH/MEDQ  D:  06/27/2007  T:  06/29/2007  Job:  [id number]

## 2010-10-05 NOTE — H&P (Signed)
NAME:  [name], [name] NO.:  [id number]   MEDICAL RECORD NO.:  [id number]                   PATIENT TYPE:  INP   LOCATION:  3034                                 FACILITY:  [facility]   PHYSICIAN:  Payton Doughty, M.D.                   DATE OF BIRTH:  [birth year]   DATE OF ADMISSION:  09/29/2002  DATE OF DISCHARGE:                                HISTORY & PHYSICAL   ADMISSION DIAGNOSIS:  Fracture at L-5.   HISTORY OF PRESENT ILLNESS:  This 77 year old right handed white lady was  transferred down here from [place].  She had been complaining of left leg  pain for about seven days.  She went to [facility] ER and was sent out.  She  went To [facility] and got admitted for pain control of her left leg.  An  MRI demonstrated an  L5 compression fracture with retropulsed fragment on the left side and  compression of the L-5 root.  She was transferred back down here for care.   PAST MEDICAL HISTORY:  1. Hypertension.  2. Hypothyroidism.  3. Mild sclerosis.   SURGICAL HISTORY:  Cholecystectomy, appendectomy, C-section, left sinus  removal exenteration for tumor in the late 70's.   ALLERGIES:  None.   MEDICATIONS:  1. Toprol XL 100 mg daily.  2. Aciphex 20 mg daily.  3. Synthroid 0.015 mg daily.  4. Hydrochlorothiazide 25 mg p.o. daily.  5. Actonel 5 mg daily.   SOCIAL HISTORY:  She does not smoke and does not drink.   FAMILY HISTORY:  Was not given.   REVIEW OF SYSTEMS:  Remarkable for back pain and left leg pain.  Sinus  difficulties related to her old surgery and her left eye is gone.   PHYSICAL EXAMINATION:  HEENT: Demonstrates absent left eye, right side looks  good and her incision appears to be well-healed.  NECK: Supple, no lymphadenopathy.  CHEST: Clear.  CARDIAC: Regular rate and rhythm.  ABDOMEN: Soft, nontender, no hepatosplenomegaly, positive bowel sounds.  EXTREMITIES: Without clubbing, cyanosis.  Peripheral pulses are good.  GU EXAM:  Deferred.  NEUROLOGIC: She is awake, alert and oriented to name and place.  Her right  pupil is reactive to light.  She has full extraocular movements are intact.  Facial movement, sensation intact save around her orbital exenteration on  the left side.  Swallowing is not difficult.  Shoulder shrugs normal.  Tongue is in the midline.  Motor exam reveals 5/5 strength throughout the  upper and lower extremities except for the left dorsiflexor of the medium  patella which was 3/5.  She has a left L5 sensory deficit, reflexes are  absent in lower extremities.  Toes are down going.  Straight leg raises are  positive on the left.  MRI as noted above.   ASSESSMENT:  Left L-5 radiculopathy secondary to fracture.   PLAN:  She  needs to have this decompression stabilized.  Plan is on Friday  to take her to the operating room and do a left L5 laminectomy, decompress  the L5 nerve root by attempting to put the bone back into the fracture or  removing it.  She will then have pedical screws placed at L4, L5 and S1 and  posterolateral in the transverse bone placed.                                               Payton Doughty, M.D.    MWR/MEDQ  D:  09/29/2002  T:  09/30/2002  Job:  [id number]

## 2010-11-10 NOTE — Group Therapy Note (Signed)
  NAME:  DORE, OQUIN NO.:  [id number]  MEDICAL RECORD NO.:  [id number]  LOCATION:                                 FACILITY:  PHYSICIAN:  Ulmer Degen L. Juanetta Gosling, M.D.DATE OF BIRTH:  10-Sep-1922  DATE OF PROCEDURE:  11/06/2010 DATE OF DISCHARGE:                                PROGRESS NOTE   Ms. Dershem is a resident at the [facility] who has been there for sometime and who has had multiple problems.  She has severe osteoporosis.  She has had multiple compression fractures.  She has some anxiety and depression which are better.  She has restless legs syndrome and she is complaining about that now.  She has had what are probably some seizures and had two episodes of becoming unconscious, hypotensive, and it was severed to that point that she would die but she has recovered essentially completely from that.  She has no other new complaints.  Her exam shows that she is awake and alert.  Her chest is relatively clear.  She looks more comfortable.  She had lab work done that looks better.  She has had problems with anemia which I think is multifactorial.  She has had some electrolyte abnormalities, but all of that seems to be improving.  Overall I think she is better and I am pleased she will continue with her current treatments and I will have her followup from there.     Delphia Kaylor L. Juanetta Gosling, M.D.     ELH/MEDQ  D:  11/06/2010  T:  11/06/2010  Job:  [id number]  Electronically Signed by Kari Baars M.D. on 11/10/2010 10:08:25 AM

## 2010-12-03 NOTE — Progress Notes (Signed)
NAME:  [name], [name] NO.:  [id number]  MEDICAL RECORD NO.:  [id number]  LOCATION:                                 FACILITY:  PHYSICIAN:  Nicolas Sisler L. Juanetta Gosling, M.D.DATE OF BIRTH:  [birth year]  DATE OF PROCEDURE: DATE OF DISCHARGE:                                PROGRESS NOTE   [name] is a patient at the [facility].  She was admitted some years ago, has had a couple of transfers out for what appeared to be seizures. She is much improved.  She feels better and says she is not having any trouble and has no complaints today.  Her physical examination shows that she is awake and alert.  Chest is clear.  Heart is regular.  Her lab work shows improvement in her hemoglobin level.  Assessment then she has had problems with hyponatremia.  She has had seizure disorder.  She has severe osteoporosis.  She has mild dementia. 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  She has had anemia and hyponatremia, but overall is better, and I do not think there is anything really to change at this point.     Heriberto Stmartin L. Juanetta Gosling, M.D.     ELH/MEDQ  D:  12/01/2010  T:  12/01/2010  Job:  [id number]

## 2010-12-31 NOTE — Progress Notes (Signed)
NAME:  [name], [name] NO.:  [id number]  MEDICAL RECORD NO.:  [id number]  LOCATION:                                 FACILITY:  PHYSICIAN:  Naiya Corral L. Juanetta Gosling, M.D.DATE OF BIRTH:  09-26-1922  DATE OF PROCEDURE:  12/29/2010 DATE OF DISCHARGE:                                PROGRESS NOTE   [name] is a patient at the [facility].  She has multiple medical problems and has been having problems recently with restless legs syndrome, but it is better since I have changed her medications.  She also has severe osteoporosis with multiple fractures.  She has some mild dementia.  She has had seizures.  She has had 2 episodes of essentially cardiopulmonary arrest that seem to be associated with seizures.  She has had problems with anemia, electrolyte abnormalities.  Her exam she is awake and alert.  She says she feels okay.  She has no new complaints.  Her chest is clear.  Heart is regular.  She has a missing left eye from surgical procedure.  ASSESSMENT:  Overall I think she is doing pretty well.  Plan then is to continue with her current treatments and medications. No changes today.     Creston Klas L. Juanetta Gosling, M.D.     ELH/MEDQ  D:  12/29/2010  T:  12/29/2010  Job:  [id number]

## 2011-01-28 NOTE — Progress Notes (Signed)
NAME:  KARSTEN, VAUGHN NO.:  [id number]  MEDICAL RECORD NO.:  [id number]  LOCATION:                                 FACILITY:  PHYSICIAN:  Yobany Vroom L. Juanetta Gosling, M.D.DATE OF BIRTH:  Sep 25, 1922  DATE OF PROCEDURE:  01/26/2011 DATE OF DISCHARGE:                                PROGRESS NOTE   Ms. Engelmann at the [facility] with multiple problems.  She has had seizures.  She has had 2 episodes where it appeared that she had stopped breathing, but has recovered from all that, she has had some problems with anemia.  Her exam shows that she is in a wheelchair.  She does have severe osteoporosis as well and has had multiple fractures.  She is mildly confused.  Her chest is relatively clear.  Heart is regular. Abdomen is soft.  Assessment is that she is overall about the same. Plan, no changes in her treatments.     Shaman Muscarella L. Juanetta Gosling, M.D.     ELH/MEDQ  D:  01/26/2011  T:  01/26/2011  Job:  [id number]

## 2011-02-18 LAB — CROSSMATCH: Antibody Screen: NEGATIVE

## 2011-02-18 LAB — DIFFERENTIAL
Basophils Absolute: 0
Basophils Absolute: 0
Basophils Relative: 0
Basophils Relative: 0
Eosinophils Absolute: 0
Eosinophils Absolute: 0.1
Eosinophils Relative: 2
Lymphocytes Relative: 12
Neutro Abs: 2.7
Neutrophils Relative %: 76

## 2011-02-18 LAB — URINALYSIS, ROUTINE W REFLEX MICROSCOPIC
Bilirubin Urine: NEGATIVE
Glucose, UA: NEGATIVE
Protein, ur: 100 — AB

## 2011-02-18 LAB — BLOOD GAS, ARTERIAL
O2 Saturation: 95.2
TCO2: 22.6
pH, Arterial: 7.417 — ABNORMAL HIGH

## 2011-02-18 LAB — CBC
HCT: 32.9 — ABNORMAL LOW
HCT: 34.4 — ABNORMAL LOW
Hemoglobin: 11.4 — ABNORMAL LOW
Platelets: 75 — ABNORMAL LOW
RBC: 4.41
RDW: 20.8 — ABNORMAL HIGH

## 2011-02-18 LAB — URINE CULTURE: Special Requests: NEGATIVE

## 2011-02-18 LAB — URINE MICROSCOPIC-ADD ON

## 2011-02-18 LAB — COMPREHENSIVE METABOLIC PANEL
ALT: 24
Alkaline Phosphatase: 65
BUN: 19
CO2: 28
GFR calc non Af Amer: 50 — ABNORMAL LOW
Glucose, Bld: 122 — ABNORMAL HIGH
Potassium: 4
Sodium: 133 — ABNORMAL LOW
Total Bilirubin: 0.6

## 2011-02-18 LAB — HEMOGLOBIN AND HEMATOCRIT, BLOOD: Hemoglobin: 7.6 g/dL — CL (ref 12.0–15.0)

## 2011-02-18 LAB — CULTURE, BLOOD (ROUTINE X 2)
Culture: NO GROWTH
Report Status: 9302009

## 2011-02-18 LAB — ABO/RH: ABO/RH(D): A POS

## 2011-02-22 ENCOUNTER — Ambulatory Visit (HOSPITAL_COMMUNITY): Payer: Medicare Other | Attending: Internal Medicine

## 2011-02-22 DIAGNOSIS — M899 Disorder of bone, unspecified: Secondary | ICD-10-CM | POA: Insufficient documentation

## 2011-02-22 DIAGNOSIS — M25569 Pain in unspecified knee: Secondary | ICD-10-CM | POA: Insufficient documentation

## 2011-02-22 DIAGNOSIS — M25579 Pain in unspecified ankle and joints of unspecified foot: Secondary | ICD-10-CM | POA: Insufficient documentation

## 2011-02-22 DIAGNOSIS — M949 Disorder of cartilage, unspecified: Secondary | ICD-10-CM | POA: Insufficient documentation

## 2011-02-22 DIAGNOSIS — M25559 Pain in unspecified hip: Secondary | ICD-10-CM | POA: Insufficient documentation

## 2011-03-02 NOTE — Progress Notes (Signed)
NAME:  DANYETTA, GILLHAM NO.:  [id number]  MEDICAL RECORD NO.:  [id number]  LOCATION:  S126                          FACILITY:  [facility]  PHYSICIAN:  Jeyli Zwicker L. Juanetta Gosling, M.D.DATE OF BIRTH:  1923/04/11  DATE OF PROCEDURE:  03/02/2011 DATE OF DISCHARGE:                                PROGRESS NOTE   HISTORY OF PRESENT ILLNESS:  Ms. Ragan continues to complain of pain in her legs.  She has what seems to be restless legs syndrome and is being treated for that, but continues to have trouble.  She is otherwise doing about the same and has no new complaints.  Her thyroid is not quite replaced yet.  PHYSICAL EXAMINATION:  GENERAL:  She is in a wheelchair. CHEST:  Clear. HEART:  Regular. ABDOMEN:  Soft. HEENT:  She is missing her left eye.  ASSESSMENT:  She has hypothyroidism and she needs more thyroid replacement.  She has osteoporosis status post multiple fractures which have left her with a lot of problems.  She has restless legs syndrome and chronic leg pain and that seems to be doing a little bit better but she needs more treatment.  She has had seizures but none recently.  PLAN:  My plan then is for her to have continue current medications.  I am going to have her increase ReQuip and I will have her increase her thyroid replacements.  No other new changes today.     Uyen Eichholz L. Juanetta Gosling, M.D.     ELH/MEDQ  D:  03/02/2011  T:  03/02/2011  Job:  [id number]

## 2011-03-24 NOTE — Progress Notes (Signed)
NAME:  ISHA, SEEFELD NO.:  [id number]  MEDICAL RECORD NO.:  [id number]  LOCATION:                                 FACILITY:  PHYSICIAN:  Devery Murgia L. Juanetta Gosling, M.D.DATE OF BIRTH:  1923-02-13  DATE OF PROCEDURE: DATE OF DISCHARGE:                                PROGRESS NOTE   [name] is a resident at the [facility] and she is at the [facility] because of multiple problems.  She has had seizures, which have resulted in two episodes of cardiopulmonary arrest.  She has severe osteoporosis. 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  She has had multiple compression fractures.  PHYSICAL EXAMINATION:  Shows that she is awake and alert, in a wheelchair, looks comfortable.  Her heart is regular.  Her chest is relatively clear.  ASSESSMENT:  I think she is overall doing fairly well.  PLAN:  No change in her treatment.  We will plan to continue with everything and go from there.     Renell Allum L. Juanetta Gosling, M.D.     ELH/MEDQ  D:  03/23/2011  T:  03/24/2011  Job:  [id number]

## 2011-04-23 ENCOUNTER — Encounter (HOSPITAL_COMMUNITY)
Admit: 2011-04-23 | Discharge: 2011-04-23 | Disposition: A | Discharge status: Home / Self Care | Payer: Medicare Other | Attending: Pulmonary Disease | Admitting: Pulmonary Disease

## 2011-04-23 ENCOUNTER — Encounter (HOSPITAL_COMMUNITY)
Admit: 2011-04-23 | Discharge: 2011-04-23 | Disposition: A | Discharge status: Skilled Nursing Facility | Payer: Medicare Other | Source: Skilled Nursing Facility | Attending: Pulmonary Disease | Admitting: Pulmonary Disease

## 2011-04-23 ENCOUNTER — Encounter (HOSPITAL_COMMUNITY): Payer: Self-pay

## 2011-04-23 DIAGNOSIS — Z859 Personal history of malignant neoplasm, unspecified: Secondary | ICD-10-CM | POA: Insufficient documentation

## 2011-04-23 MED ORDER — TECHNETIUM TC 99M MEDRONATE IV KIT
25.0000 | PACK | Freq: Once | INTRAVENOUS | Status: AC | PRN
  Administered 2011-04-23: 24 via INTRAVENOUS

## 2011-05-15 NOTE — Progress Notes (Signed)
NAME:  ANGELLY, SPEARING NO.:  [id number]  MEDICAL RECORD NO.:  [id number]  LOCATION:                                 FACILITY:  PHYSICIAN:  Delvina Mizzell L. Juanetta Gosling, M.D.DATE OF BIRTH:  [birth year]  DATE OF PROCEDURE: DATE OF DISCHARGE:                                PROGRESS NOTE   [name] is a patient at the [facility] and she is admitted with multiple medical problems including chronic osteoporosis, status post multiple compression fractures.  She is having more problems with pain now.  She has had a cancer in her sinus which resulted in enucleation of her eye and she has had some problem with depression.  She seems better.  Her exam shows that she is sitting quietly in a wheelchair, looks comfortable.  Her heart is regular.  Her abdomen is soft.  Extremities showed no edema and her central nervous system examination is grossly intact.  She also has a seizure disorder that has caused her to have 2 episodes of what appears to be cardiopulmonary arrest, but she has not had any recently.  My plan then is to continue with her current treatments.  I did not add anything, and I will see her back.  I will continue to follow here in the nursing home.     Daiquan Resnik L. Juanetta Gosling, M.D.     ELH/MEDQ  D:  05/12/2011  T:  05/12/2011  Job:  [id number]

## 2011-05-27 NOTE — Progress Notes (Signed)
NAME:  [name], [name] NO.:  [id number]  MEDICAL RECORD NO.:  [id number]  LOCATION:                                 FACILITY:  PHYSICIAN:  Don Tiu L. Juanetta Gosling, M.D.DATE OF BIRTH:  09/09/1922  DATE OF PROCEDURE: DATE OF DISCHARGE:                                PROGRESS NOTE   [name] says she is having more problems.  She has had difficulty with congestion in her chest and has been coughing some.  She has had more pain in her back and legs and that has been extensively worked up and we have not found anything except for known osteoporosis and osteoarthritis.  She has what appears to be a urinary tract infection, but she is allergic or intolerant of multiple antibiotics, so treatment may be difficult.  She has had seizures that have caused her to be unresponsive at times and she in fact has had 2 episodes where she had bradycardia and it was felt that she might not survive, but she has come back and felt better.  She also had a sinus carcinoma and has had removal of her left eye.  PHYSICAL EXAMINATION:  CHEST:  Rhonchi bilaterally. HEART:  Regular. ABDOMEN:  Soft. EXTREMITIES:  Mild edema which is chronic. CENTRAL NERVOUS SYSTEM:  Grossly intact.  ASSESSMENT:  My assessment then is that she has multiple medical problems as noted above.  She now has acute bronchitis, I think.  I do not see any evidence that she has pneumonia.  She has a urinary tract infection and her urinary tract infection has been difficult to treat because of medication intolerances.  PLAN:  My plan is to wait for the culture results.  I am going to go ahead and start her on Zithromax which she can tolerate that, but I do not think that will help with her urinary situation.     Lantz Hermann L. Juanetta Gosling, M.D.     ELH/MEDQ  D:  05/25/2011  T:  05/25/2011  Job:  [id number]

## 2011-07-01 NOTE — Progress Notes (Signed)
NAME:  KYLIEGH, JESTER NO.:  [id number]  MEDICAL RECORD NO.:  [id number]  LOCATION:                                 FACILITY:  PHYSICIAN:  Drayven Marchena L. Juanetta Gosling, M.D.DATE OF BIRTH:  October 29, 1922  DATE OF PROCEDURE: DATE OF DISCHARGE:                                PROGRESS NOTE   Ms. Krull says she is doing okay, except there has been some sort of a mixup with her medication.  She did not get her Duragesic patch, so she is having a lot of pain.  She is otherwise doing well.  PHYSICAL EXAMINATION:  EYES:  Shows that she has had the enucleation of her left eye. CHEST:  Clear. HEART:  Regular. ABDOMEN:  Soft.  She does appear to be in pain.  ASSESSMENT AND PLAN:  She has chronic back pain, mostly related to osteoporosis.  She has a seizure disorder, but none recently.  She has severe osteoporosis and osteoarthritis and my plan is for her to continue with her current treatments, get her back on the Duragesic patch, and see if we can get her pain better controlled.     Bram Hottel L. Juanetta Gosling, M.D.     ELH/MEDQ  D:  06/30/2011  T:  06/30/2011  Job:  [id number]

## 2011-08-05 NOTE — Progress Notes (Signed)
NAME:  [name], [name] NO.:  [id number]  MEDICAL RECORD NO.:  [id number]  LOCATION:                                 FACILITY:  PHYSICIAN:  Sawyer Mentzer L. Juanetta Gosling, M.D.DATE OF BIRTH:  1922/07/09  DATE OF PROCEDURE: DATE OF DISCHARGE:                                PROGRESS NOTE   [name] has been having symptoms of a urinary tract infection and she has multiple allergies, so I have not started her on an antibiotics yet until we have a better idea what her, because of the allergies.  She has no other new complaints.  She says she feels well.  PHYSICAL EXAMINATION:  GENERAL:  She is awake and alert. ABDOMEN:  Soft. EXTREMITIES:  No edema. CENTRAL NERVOUS SYSTEM:  Grossly intact.  ASSESSMENT:  Then is that, she is about the same.  She has seizure disorder.  She has severe osteoporosis, status post multiple fractures. She has had problems with depression which I think are about the same.  PLAN:  Then, no changes in her treatments while I am awaiting the results of her culture and sensitivity before I start antibiotics.     [name] L. Juanetta Gosling, M.D.     ELH/MEDQ  D:  08/04/2011  T:  08/05/2011  Job:  [id number]

## 2011-08-24 NOTE — Progress Notes (Signed)
NAME:  [name], [name] NO.:  [id number]  MEDICAL RECORD NO.:  [id number]  LOCATION:  RAD                           FACILITY:  [facility]  PHYSICIAN:  Patrese Neal L. Juanetta Gosling, M.D.DATE OF BIRTH:  1922/07/02  DATE OF PROCEDURE: DATE OF DISCHARGE:  04/23/2011                                PROGRESS NOTE   The patient at the [facility].  [name] is overall about the same.  She said she is doing okay.  She has had chronic back pain.  She has had seizure disorder and she has had 2 episodes of near cardiopulmonary arrest that are thought to be related to seizures.  She has severe osteoarthritis and osteoporosis.  PHYSICAL EXAMINATION:  Shows that she is awake and alert.  Her heart is regular.  Her abdomen is soft.  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  ASSESSMENT:  She is doing well.  I do not think there is really anything to add at this point.  PLAN:  Is to continue with her current medications and treatments.     Huy Majid L. Juanetta Gosling, M.D.     ELH/MEDQ  D:  08/24/2011  T:  08/24/2011  Job:  [id number]

## 2011-10-07 NOTE — Progress Notes (Signed)
NAME:  [name], [name] NO.:  [id number]  MEDICAL RECORD NO.:  [id number]  LOCATION:                                 FACILITY:  PHYSICIAN:  Mellany Dinsmore L. Juanetta Gosling, M.D.DATE OF BIRTH:  01-09-1923  DATE OF PROCEDURE:  10/06/2011 DATE OF DISCHARGE:                                PROGRESS NOTE   [name] is overall about the same.  She has no new complaints.  Her legs still giving her a lot of problems.  PHYSICAL EXAMINATION:  GENERAL:  Shows that she is awake and alert. CHEST:  Clear. HEART:  Regular.  She is sitting in a wheelchair. EYES:  She has a left eye replacement. HEART:  Regular. ABDOMEN:  Soft.  ASSESSMENT:  She has multiple problems and she is still having a lot of difficulty with leg pain.  My plan then is to have her continue on her treatments.  I am going to have her add some gabapentin and see if it makes any difference, and I will continue with everything else.     Darlisha Kelm L. Juanetta Gosling, M.D.     ELH/MEDQ  D:  10/06/2011  T:  10/06/2011  Job:  [id number]

## 2011-10-21 NOTE — Progress Notes (Signed)
NAME:  STEPHANA, MORELL NO.:  [id number]  MEDICAL RECORD NO.:  [id number]  LOCATION:                                 FACILITY:  PHYSICIAN:  Breannah Kratt L. Juanetta Gosling, M.D.DATE OF BIRTH:  04/03/1923  DATE OF PROCEDURE:  10/19/2011 DATE OF DISCHARGE:                                PROGRESS NOTE   [name] is a patient at the [facility].  She has multiple medical problems including severe osteoporosis, status post multiple fractures.  She has done well with that.  She has had recurrent urinary tract infections and she has another urinary tract infection now.  She has been anemic which is thought to be anemia of chronic disease.  She has had seizures and the seizures have actually twice caused her to have near death experiences overall though she is better.  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  Her chest is clear.  Her heart is regular. Abdomen is soft.  She is in a wheelchair.  She looks more comfortable. Her urine is growing organism that is sensitive to Macrodantin.  Assessment then is that she has got multiple medical problems.  She currently has a urinary tract infection in addition to her chronic problems.  She has not had any more seizures recently.  Her hemoglobin level has been okay.  I am going to treat this and then try to put her on suppressive dose, but I told her that may not work.  It may simply select out more resistant organisms.  She understands and wants to try.     Secundino Ellithorpe L. Juanetta Gosling, M.D.     ELH/MEDQ  D:  10/20/2011  T:  10/20/2011  Job:  [id number]

## 2011-12-31 NOTE — Progress Notes (Signed)
NAME:  [name], [name] NO.:  [id number]  MEDICAL RECORD NO.:  [id number]  LOCATION:                                 FACILITY:  PHYSICIAN:  Farron Watrous L. Juanetta Gosling, M.D.DATE OF BIRTH:  11-10-1922  DATE OF PROCEDURE:  12/29/2011 DATE OF DISCHARGE:                                PROGRESS NOTE   The patient is at skilled care facility.  Problems include osteoporosis, history of sinus carcinoma, absence seizure.  She says she is feeling okay.  She has no new complaints.  PHYSICAL EXAMINATION:  HEENT:  Her pupils are reactive.  Nose and throat are clear. NECK:  Supple without masses. CHEST:  Fairly clear. HEART:  Regular. ABDOMEN:  Soft.  My plan then to continue with her progress, treatments, and medications without change, and overall I think she is doing better.     Rebekka Lobello L. Juanetta Gosling, M.D.     ELH/MEDQ  D:  12/29/2011  T:  12/29/2011  Job:  [id number]

## 2012-01-21 NOTE — Progress Notes (Signed)
NAME:  [name], [name] NO.:  [id number]  MEDICAL RECORD NO.:  [id number]  LOCATION:                                 FACILITY:  PHYSICIAN:  Takia Runyon L. Juanetta Gosling, M.D.DATE OF BIRTH:  08/17/1922  DATE OF PROCEDURE:  01/19/2012 DATE OF DISCHARGE:                                PROGRESS NOTE   Patient of the [facility].  [name] has a long known history of multiple problems and has been having more problems recently with urinary tract infection, but doing okay now.  She has had seizures that have caused her to have two near respiratory arrest.  She has severe osteoporosis. She says she feels well.  She has no complaints.  PHYSICAL EXAMINATION:  GENERAL:  Her exam shows that she is awake and alert.  She is sitting in a wheelchair. 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  Assessment then, she has got multiple medical problems but has seems to be doing better and is pretty stable right now.  My plan is to continue with her current treatments and medications.  No changes.     [name] L. Juanetta Gosling, M.D.     ELH/MEDQ  D:  01/19/2012  T:  01/20/2012  Job:  [id number]

## 2012-02-22 NOTE — Progress Notes (Signed)
[name] is a resident at the skilled care facility. She has multiple problems including seizure disorder chronic anemia previous enucleation of her left eye for cancer severe osteoporosis following multiple fractures and anxiety and depression. She also probably has some mild dementia she says she feels well  She is awake and alert. She is in her wheelchair. Her chest is clear. She is missing her left eye. Her heart is regular. Her abdomen is soft.  She has multiple medical problems including recurrent urinary tract infections but right now seems to be doing well.  No change in treatments

## 2012-03-23 NOTE — Progress Notes (Signed)
She says she feels fairly well. She has no new complaints. Her breathing is okay. She has not had any recent problems with urinary tract infection.  She is awake and alert. She has had immediately aeration of her left eye. She is in a wheelchair. Her chest is clear. Her heart is regular. She does not have any edema of the extremities.  She has multiple medical problems including seizure disorder which is stable. She has severe osteoporosis but no recent fractures. She had a cancer in the sinus with enucleation of her left eye which is also stable. She is multiple and recurrent urinary tract infections but nothing recently  No change in medications I think she's pretty stable at this point

## 2012-05-04 NOTE — Progress Notes (Signed)
She says she feels okay. She has no new complaints. Her back still gives her some trouble in her legs are still painful. She had laboratory work done that was okay  She is awake and alert. She is sitting in a wheelchair. 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 Her chest is clear. Her heart is regular.  She has multiple medical problems including seizure disorder which is stable osteoporosis which is about the same anemia unchanged  No change in medications I think she's doing fairly well

## 2012-05-24 NOTE — Progress Notes (Signed)
She says she feels well. She has no complaints. She has not had anymore seizures.  She is awake and alert. She is sitting in a wheelchair and looks comfortable. 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 Her chest is clear. Her heart is regular  She has severe osteoporosis status post multiple fractures. She has a seizure disorder but no seizures recently. She has chronic anemia. She had cancer in the sinus area and had the enucleation of her eye. She's had problems with depression which seems stable  No change in treatments

## 2012-05-26 ENCOUNTER — Ambulatory Visit (HOSPITAL_COMMUNITY): Payer: Medicare Other | Attending: Pulmonary Disease

## 2012-05-26 DIAGNOSIS — I517 Cardiomegaly: Secondary | ICD-10-CM | POA: Insufficient documentation

## 2012-05-26 DIAGNOSIS — K449 Diaphragmatic hernia without obstruction or gangrene: Secondary | ICD-10-CM | POA: Insufficient documentation

## 2012-05-26 DIAGNOSIS — R059 Cough, unspecified: Secondary | ICD-10-CM | POA: Insufficient documentation

## 2012-05-26 DIAGNOSIS — R05 Cough: Secondary | ICD-10-CM | POA: Insufficient documentation

## 2012-06-27 NOTE — Progress Notes (Signed)
She says she feels about the same. She had a urinary tract infection and that has been hard to treat because of multiple medication intolerances. This is a recurrent problem. She has a seizure disorder which is stable.  She is awake and alert and in her wheelchair. 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 Her heart is regular her abdomen is soft  She has urinary tract infection. She has severe osteoporosis status post multiple fractures. She had a cancer of the sinus requiring enucleation several years ago she has seizure disorder.  She will finish antibiotics. No other changes in treatment

## 2012-07-04 ENCOUNTER — Ambulatory Visit (HOSPITAL_COMMUNITY): Payer: Medicare Other | Attending: Pulmonary Disease

## 2012-07-04 DIAGNOSIS — K449 Diaphragmatic hernia without obstruction or gangrene: Secondary | ICD-10-CM | POA: Insufficient documentation

## 2012-07-13 ENCOUNTER — Ambulatory Visit (HOSPITAL_COMMUNITY): Payer: Medicare Other | Attending: Internal Medicine

## 2012-07-13 DIAGNOSIS — R5381 Other malaise: Secondary | ICD-10-CM | POA: Insufficient documentation

## 2012-07-13 DIAGNOSIS — K449 Diaphragmatic hernia without obstruction or gangrene: Secondary | ICD-10-CM | POA: Insufficient documentation

## 2012-07-16 ENCOUNTER — Emergency Department (HOSPITAL_COMMUNITY)
Admission: EM | Admit: 2012-07-16 | Discharge: 2012-07-16 | Disposition: A | Discharge status: Skilled Nursing Facility | Payer: Medicare Other | Attending: Emergency Medicine | Admitting: Emergency Medicine

## 2012-07-16 ENCOUNTER — Emergency Department (HOSPITAL_COMMUNITY): Payer: Medicare Other

## 2012-07-16 ENCOUNTER — Inpatient Hospital Stay
Admission: RE | Admit: 2012-07-16 | Discharge: 2013-06-08 | Disposition: A | Discharge status: Nursing Home (Includes ICF) | Payer: Medicaid Other | Source: Ambulatory Visit | Attending: Pulmonary Disease | Admitting: Pulmonary Disease

## 2012-07-16 ENCOUNTER — Encounter (HOSPITAL_COMMUNITY): Payer: Self-pay

## 2012-07-16 DIAGNOSIS — W050XXA Fall from non-moving wheelchair, initial encounter: Secondary | ICD-10-CM | POA: Insufficient documentation

## 2012-07-16 DIAGNOSIS — F3289 Other specified depressive episodes: Secondary | ICD-10-CM | POA: Insufficient documentation

## 2012-07-16 DIAGNOSIS — Z9889 Other specified postprocedural states: Secondary | ICD-10-CM | POA: Insufficient documentation

## 2012-07-16 DIAGNOSIS — Z8701 Personal history of pneumonia (recurrent): Secondary | ICD-10-CM | POA: Insufficient documentation

## 2012-07-16 DIAGNOSIS — M810 Age-related osteoporosis without current pathological fracture: Secondary | ICD-10-CM | POA: Insufficient documentation

## 2012-07-16 DIAGNOSIS — F329 Major depressive disorder, single episode, unspecified: Secondary | ICD-10-CM | POA: Insufficient documentation

## 2012-07-16 DIAGNOSIS — Z8639 Personal history of other endocrine, nutritional and metabolic disease: Secondary | ICD-10-CM | POA: Insufficient documentation

## 2012-07-16 DIAGNOSIS — E079 Disorder of thyroid, unspecified: Secondary | ICD-10-CM | POA: Insufficient documentation

## 2012-07-16 DIAGNOSIS — Y9389 Activity, other specified: Secondary | ICD-10-CM | POA: Insufficient documentation

## 2012-07-16 DIAGNOSIS — Z8522 Personal history of malignant neoplasm of nasal cavities, middle ear, and accessory sinuses: Secondary | ICD-10-CM | POA: Insufficient documentation

## 2012-07-16 DIAGNOSIS — Z79899 Other long term (current) drug therapy: Secondary | ICD-10-CM | POA: Insufficient documentation

## 2012-07-16 DIAGNOSIS — G8929 Other chronic pain: Secondary | ICD-10-CM | POA: Insufficient documentation

## 2012-07-16 DIAGNOSIS — K219 Gastro-esophageal reflux disease without esophagitis: Secondary | ICD-10-CM | POA: Insufficient documentation

## 2012-07-16 DIAGNOSIS — Z862 Personal history of diseases of the blood and blood-forming organs and certain disorders involving the immune mechanism: Secondary | ICD-10-CM | POA: Insufficient documentation

## 2012-07-16 DIAGNOSIS — Z8781 Personal history of (healed) traumatic fracture: Secondary | ICD-10-CM | POA: Insufficient documentation

## 2012-07-16 DIAGNOSIS — G40909 Epilepsy, unspecified, not intractable, without status epilepticus: Secondary | ICD-10-CM | POA: Insufficient documentation

## 2012-07-16 DIAGNOSIS — Y9210 Unspecified residential institution as the place of occurrence of the external cause: Secondary | ICD-10-CM | POA: Insufficient documentation

## 2012-07-16 DIAGNOSIS — IMO0002 Reserved for concepts with insufficient information to code with codable children: Secondary | ICD-10-CM | POA: Insufficient documentation

## 2012-07-16 DIAGNOSIS — Z8614 Personal history of Methicillin resistant Staphylococcus aureus infection: Secondary | ICD-10-CM | POA: Insufficient documentation

## 2012-07-16 DIAGNOSIS — Z8709 Personal history of other diseases of the respiratory system: Secondary | ICD-10-CM | POA: Insufficient documentation

## 2012-07-16 HISTORY — DX: Personal history of malignant neoplasm of nasal cavities, middle ear, and accessory sinuses: Z85.22

## 2012-07-16 HISTORY — DX: Unspecified convulsions: R56.9

## 2012-07-16 HISTORY — DX: Dysphagia, unspecified: R13.10

## 2012-07-16 HISTORY — DX: Age-related osteoporosis without current pathological fracture: M81.0

## 2012-07-16 HISTORY — DX: Carrier or suspected carrier of methicillin resistant Staphylococcus aureus: Z22.322

## 2012-07-16 HISTORY — DX: Respiratory failure, unspecified, unspecified whether with hypoxia or hypercapnia: J96.90

## 2012-07-16 HISTORY — DX: Collapsed vertebra, not elsewhere classified, site unspecified, initial encounter for fracture: M48.50XA

## 2012-07-16 HISTORY — DX: Depression, unspecified: F32.A

## 2012-07-16 HISTORY — DX: Other chronic pain: G89.29

## 2012-07-16 HISTORY — DX: Pneumonitis due to inhalation of food and vomit: J69.0

## 2012-07-16 HISTORY — DX: Major depressive disorder, single episode, unspecified: F32.9

## 2012-07-16 NOTE — ED Notes (Signed)
Patient states she does not need anything at this time. 

## 2012-07-16 NOTE — ED Notes (Signed)
Pt arrived by ems from [facility], she was in wheelchair, was reaching for pillow and slid into floor from wheelchair. She did not fall

## 2012-07-16 NOTE — ED Provider Notes (Signed)
History     CSN: [id number]  Arrival date & time 07/16/12  1235   First MD Initiated Contact with Patient 07/16/12 1307      Chief Complaint  Patient presents with  . Fall     HPI Pt was seen at 1325.  Per pt, family, NH report, and EMS, pt c/o sudden onset and resolution of one episode of slip and fall out of her wheelchair at the [facility].  Pt states she "was reaching for a pillow" when she slid to the floor. Pt only c/o pain in her lower back.  Denies syncope, no LOC, no AMS, no CP/SOB, no abd pain, no N/V/D, no UE's or LE's pain.      Past Medical History  Diagnosis Date  . Seizures   . Aspiration pneumonia   . Dysphagia   . Osteoporosis   . Thyroid disease   . Depression   . MRSA (methicillin resistant staph aureus) culture positive   . Respiratory failure   . Hypokalemia   . Reflux   . Vertebral compression fracture     thoracic and lumbar  . History of sinus cancer 1970's    left side  . Chronic pain     Past Surgical History  Procedure Laterality Date  . Kyphoplasty      Lumbar and thoracic  . Palate surgery  1970's    removal of hard and soft palate on left side  . Enucleation Left 1970's    left eye  . Cholecystectomy    . Appendectomy    . Cesarean section       History  Substance Use Topics  . Smoking status: Never Smoker   . Smokeless tobacco: Not on file  . Alcohol Use: No     Review of Systems ROS: Statement: All systems negative except as marked or noted in the HPI; Constitutional: Negative for fever and chills. ; ; Eyes: Negative for eye pain, redness and discharge. ; ; ENMT: Negative for ear pain, hoarseness, nasal congestion, sinus pressure and sore throat. ; ; Cardiovascular: Negative for chest pain, palpitations, diaphoresis, dyspnea and peripheral edema. ; ; Respiratory: Negative for cough, wheezing and stridor. ; ; Gastrointestinal: Negative for nausea, vomiting, diarrhea, abdominal pain, blood in stool, hematemesis, jaundice and  rectal bleeding. . ; ; Genitourinary: Negative for dysuria, flank pain and hematuria. ; ; Musculoskeletal: +LBP. Negative for neck pain. Negative for swelling and trauma.; ; Skin: Negative for pruritus, rash, abrasions, blisters, bruising and skin lesion.; ; Neuro: Negative for headache, lightheadedness and neck stiffness. Negative for weakness, altered level of consciousness , altered mental status, extremity weakness, paresthesias, involuntary movement, seizure and syncope.      Allergies  Aricept; Bactrim; Ciprofloxacin; Darvocet; Namenda; Penicillins; and Vibramycin  Home Medications   Current Outpatient Rx  Name  Route  Sig  Dispense  Refill  . AZO-CRANBERRY PO   Oral   Take 1 tablet by mouth 2 (two) times daily.         . calcium-vitamin D (OSCAL WITH D) 500-200 MG-UNIT per tablet   Oral   Take 1 tablet by mouth 3 (three) times daily.         Marland Kitchen docusate sodium (COLACE) 100 MG capsule   Oral   Take 100 mg by mouth 2 (two) times daily.         . Ensure Plus (ENSURE PLUS) LIQD   Oral   Take 237 mLs by mouth daily. Drinks with morning meds.         Marland Kitchen  escitalopram (LEXAPRO) 10 MG tablet   Oral   Take 10 mg by mouth daily.         . fentaNYL (DURAGESIC - DOSED MCG/HR) 50 MCG/HR   Transdermal   Place 1 patch onto the skin every other day.         Marland Kitchen HYDROcodone-acetaminophen (NORCO/VICODIN) 5-325 MG per tablet   Oral   Take 1 tablet by mouth 3 (three) times daily.         . iron polysaccharides (NIFEREX) 150 MG capsule   Oral   Take 150 mg by mouth 2 (two) times daily.         Marland Kitchen levETIRAcetam (KEPPRA) 250 MG tablet   Oral   Take 250 mg by mouth every 12 (twelve) hours.         Marland Kitchen levothyroxine (SYNTHROID, LEVOTHROID) 200 MCG tablet   Oral   Take 200 mcg by mouth daily.         Marland Kitchen levothyroxine (SYNTHROID, LEVOTHROID) 50 MCG tablet   Oral   Take 50 mcg by mouth daily.         Marland Kitchen loratadine (CLARITIN) 10 MG tablet   Oral   Take 10 mg by mouth  daily.         . metoprolol tartrate (LOPRESSOR) 25 MG tablet   Oral   Take 25 mg by mouth daily.         . Multiple Vitamin (MULTIVITAMIN WITH MINERALS) TABS   Oral   Take 1 tablet by mouth daily.         . nitrofurantoin (MACRODANTIN) 50 MG capsule   Oral   Take 50 mg by mouth at bedtime.         Marland Kitchen omeprazole (PRILOSEC) 20 MG capsule   Oral   Take 20 mg by mouth at bedtime.         . phenytoin (DILANTIN) 100 MG ER capsule   Oral   Take 200 mg by mouth daily.         Marland Kitchen rOPINIRole (REQUIP) 4 MG tablet   Oral   Take 4 mg by mouth at bedtime.         . sennosides-docusate sodium (SENOKOT-S) 8.6-50 MG tablet   Oral   Take 1 tablet by mouth 2 (two) times daily.         Marland Kitchen zolpidem (AMBIEN) 5 MG tablet   Oral   Take 5 mg by mouth at bedtime.           BP 169/79  Pulse 99  Temp(Src) 97.8 F (36.6 C) (Oral)  Resp 18  Wt 108 lb (48.988 kg)  SpO2 91%  Physical Exam 1330: Physical examination: Vital signs and O2 SAT: Reviewed; Constitutional: Well developed, Well nourished, In no acute distress; Head and Face: Normocephalic, Atraumatic; Eyes: EOMI right, +left eye enucleated per hx. No scleral icterus; ENMT: Mouth and pharynx normal, Mucous membranes dry; Neck: Immobilized in C-collar, Trachea midline; Spine: No midline CS, TS, LS tenderness. +kyphotic. +TTP left lumbar paraspinal muscles.; Cardiovascular: Regular rate and rhythm, No gallop; Respiratory: Breath sounds clear & equal bilaterally, No rales, rhonchi, wheezes, Normal respiratory effort/excursion; Chest: Nontender, No deformity, Movement normal, No crepitus, No abrasions or ecchymosis.; Abdomen: Soft, Nontender, Nondistended, Normal bowel sounds, No abrasions or ecchymosis.; Genitourinary: No CVA tenderness;; Extremities: No deformity, Full range of motion major/large joints of bilat UE's and LE's without pain or tenderness to palp, Neurovascularly intact, Pulses normal, No tenderness, No edema, Pelvis  stable; Neuro: AA&Ox3,  Major CN grossly intact. Speech clear. Moves all ext well on stretcher spontaneously and to command without apparent gross focal motor deficits.; Skin: Color normal, Warm, Dry.    ED Course  Procedures    MDM  MDM Reviewed: previous chart, nursing note and vitals Interpretation: x-ray and CT scan    Dg Chest 1 View 07/16/2012  *RADIOLOGY REPORT*  Clinical Data: Fall today with pain.  CHEST - 1 VIEW  Comparison: 07/13/2012  Findings: Osteopenia.  Multilevel upper lumbar and lower thoracic vertebral augmentation.  Lower lumbar spine fixation.  Midline trachea.  Mild cardiomegaly with a moderate-to-large hiatal hernia.  Mild right hemidiaphragm elevation. No pleural effusion or pneumothorax.  No congestive failure.  Clear lungs.  IMPRESSION: Cardiomegaly and a hiatal hernia. No acute findings.   Original Report Authenticated By: Jeronimo Greaves, M.D.    Dg Pelvis 1-2 Views 07/16/2012  *RADIOLOGY REPORT*  Clinical Data: Fall today.  PELVIS - 1-2 VIEW  Comparison: 02/22/2011  Findings: Single AP view of the pelvis.  Proximal right femoral fixation secondary to remote trauma.  Lumbar spine fixation.  Mild osteopenia.  No acute fracture.  IMPRESSION: Proximal right femoral fixation. No acute osseous abnormality.   Original Report Authenticated By: Jeronimo Greaves, M.D.    Ct Head Wo Contrast 07/16/2012  *RADIOLOGY REPORT*  Clinical Data:  Nursing home patient.  Fell from wheelchair.  Head pain.  Neck pain.  CT HEAD WITHOUT CONTRAST CT CERVICAL SPINE WITHOUT CONTRAST  Technique:  Multidetector CT imaging of the head and cervical spine was performed following the standard protocol without intravenous contrast.  Multiplanar CT image reconstructions of the cervical spine were also generated.  Comparison:  CT head 07/09/2010.  CT HEAD  Findings: There is no evidence for acute infarction, intracranial hemorrhage, mass lesion, hydrocephalus, or extra-axial fluid. Moderate to severe atrophy is  present.  Advanced chronic microvascular ischemic change is present in the periventricular and subcortical white matter.  There is a chronic deformity of the left face with enucleation of the left eye and some type of soft tissue implant.  Chronic air- fluid level in the left orbit is noted versus fibrosis.  The calvarium is intact otherwise.  There is no definite acute sinus or mastoid fluid.  Similar appearance to 2012.  IMPRESSION: Chronic changes as described.  CT CERVICAL SPINE  Findings: Exaggerated cervical lordosis. 3 mm facet mediated slip C6-C7 and 1-2 mm facet mediated slip C7-T1.  Osteopenia. Multilevel facet arthropathy.  No definite fracture or traumatic subluxation.  No prevertebral soft tissue swelling.  No pneumothorax.  Mild pannus.  IMPRESSION: Exaggerated cervical lordosis.  No visible cervical spine fracture or traumatic subluxation.   Original Report Authenticated By: Davonna Belling, M.D.    Ct Cervical Spine Wo Contrast 07/16/2012  *RADIOLOGY REPORT*  Clinical Data:  Nursing home patient.  Fell from wheelchair.  Head pain.  Neck pain.  CT HEAD WITHOUT CONTRAST CT CERVICAL SPINE WITHOUT CONTRAST  Technique:  Multidetector CT imaging of the head and cervical spine was performed following the standard protocol without intravenous contrast.  Multiplanar CT image reconstructions of the cervical spine were also generated.  Comparison:  CT head 07/09/2010.  CT HEAD  Findings: There is no evidence for acute infarction, intracranial hemorrhage, mass lesion, hydrocephalus, or extra-axial fluid. Moderate to severe atrophy is present.  Advanced chronic microvascular ischemic change is present in the periventricular and subcortical white matter.  There is a chronic deformity of the left face with enucleation of the left eye and  some type of soft tissue implant.  Chronic air- fluid level in the left orbit is noted versus fibrosis.  The calvarium is intact otherwise.  There is no definite acute sinus or  mastoid fluid.  Similar appearance to 2012.  IMPRESSION: Chronic changes as described.  CT CERVICAL SPINE  Findings: Exaggerated cervical lordosis. 3 mm facet mediated slip C6-C7 and 1-2 mm facet mediated slip C7-T1.  Osteopenia. Multilevel facet arthropathy.  No definite fracture or traumatic subluxation.  No prevertebral soft tissue swelling.  No pneumothorax.  Mild pannus.  IMPRESSION: Exaggerated cervical lordosis.  No visible cervical spine fracture or traumatic subluxation.   Original Report Authenticated By: Davonna Belling, M.D.    Ct Lumbar Spine Wo Contrast 07/16/2012  *RADIOLOGY REPORT*  Clinical Data: Slid into the floor from wheelchair.  Low back pain.  CT LUMBAR SPINE WITHOUT CONTRAST  Technique:  Multidetector CT imaging of the lumbar spine was performed without intravenous contrast administration. Multiplanar CT image reconstructions were also generated.  Comparison: CT lumbar spine 04/25/2008  Findings: There has been multiple levels of vertebral augmentation from T12-L3.  There is a chronic L4-S1 posterolateral fusion. Chronic biconcave deformity L5.  No acute lumbar fracture or traumatic subluxation.  No significant interval worsening from priors.  Loosening both S1 screws is a chronic finding. Vascular calcification is nonaneurysmal.  No worrisome osseous lesions.  IMPRESSION: T12-L3 vertebral augmentation appears stable.  L4-S1 fusion is also stable.  No acute compression fracture or traumatic subluxation.   Original Report Authenticated By: Davonna Belling, M.D.      [phone]:  Family requested CT of LS instead of XR "because the last time they didn't find the fracture with an XR."  CT's and XR completed.  No acute findings.  No midline CS tenderness, FROM CS without midline tenderness. No NMS changes.  C-collar removed.  Pt remains at her baseline per family at bedside.  Want pt to go back to NH and pt wants to go back now.  Dx and testing d/w pt and family.  Questions answered.  Verb understanding,  agreeable to d/c home with outpt f/u.           Laray Anger, DO 07/18/12 [phone]

## 2012-07-16 NOTE — ED Notes (Signed)
Patient does not need anything else at this time. 

## 2012-08-01 NOTE — Progress Notes (Signed)
She is overall about the same. She is complaining of being sleepy and sometimes waking up disoriented she had a urinary tract infection but that is okay neck she has been having episodes of hypoxia. She has not had any seizures  She is awake and alert. Her chest is clear. She is in a wheelchair.  I don't think we need to change anything she will nee oxygen periodically. There has been some problem with her Dilantin level and that needs to be rechecked

## 2012-09-18 NOTE — Progress Notes (Signed)
No new complaints have been noted. She is awake and alert. She has not had anymore seizures.  She is awake and alert. Her chest is clear. Her heart is regular.  She has multiple medical problems including severe osteoporosis status post multiple fractures. She had a cancer in the sinus which has required enucleation of her left eye many years ago but there is no evidence that she has any sort of recurrence of that. She has chronic pain related to her osteoporosis and multiple fractures. She has recurrent urinary tract infections. She has multiple allergies which makes it more difficult to treat her UTI. She has seizure disorder and has not had seizures in several months  No change in treatments

## 2012-10-19 NOTE — Progress Notes (Signed)
She has no new complaints. She says she doesn't feel well in general. She has a lot of back pain. No other new complaints.  Her chest is clear. Her heart is regular. Her abdomen is soft.  She has multiple medical problems. She has seizure disorder and she thinks the seizure medicine may be making her feel bad. She has severe osteoporosis status post multiple fractures. She has some depression. She had a cancer in the sinus which required enucleation of her left eye  No change in treatments.

## 2012-11-06 ENCOUNTER — Ambulatory Visit (HOSPITAL_COMMUNITY)
Admit: 2012-11-06 | Discharge: 2012-11-06 | Disposition: A | Discharge status: Home / Self Care | Payer: Medicare Other | Source: Ambulatory Visit | Attending: Pulmonary Disease | Admitting: Pulmonary Disease

## 2012-11-06 DIAGNOSIS — M7989 Other specified soft tissue disorders: Secondary | ICD-10-CM | POA: Insufficient documentation

## 2012-11-09 ENCOUNTER — Other Ambulatory Visit (HOSPITAL_COMMUNITY): Payer: Medicare Other

## 2012-11-30 NOTE — Progress Notes (Signed)
I saw her on the 13th. She had been having trouble with swelling in her legs the right worse than the left. She had a venous Doppler done and it does not show a clot. She has been receiving diuretics and it has not helped very much. We discussed using compression stockings and she is using a mild compression stocking and she does not want to try anything tighter than that because she has difficulty getting on her current stockings. Otherwise she's doing well. She has not had any seizures.  She is awake and alert and sitting in her wheelchair. Her left eye has been surgically removed. Her chest is clear. Her heart is regular. She does have trace edema of the left leg and 1+ of the right  No change in treatments

## 2012-12-26 NOTE — Progress Notes (Signed)
NAME:  VIANA, SLEEP NO.:  [id number]  MEDICAL RECORD NO.:  [id number]  LOCATION:  XRAY                          FACILITY:  [facility]  PHYSICIAN:  Josetta Wigal L. Juanetta Gosling, M.D.DATE OF BIRTH:  08/05/1922  DATE OF PROCEDURE: DATE OF DISCHARGE:  11/06/2012                                PROGRESS NOTE   Ms. Meininger is a patient at the [facility].  She feels about the same. She has not had any seizures.  Her arthritis is about the same.  She has no other new complaints.  PHYSICAL EXAMINATION:  GENERAL:  She is awake and alert. 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  She is in a wheelchair. CHEST:  Clear. HEART:  Regular. ABDOMEN:  Soft.  ASSESSMENT:  She has severe osteoarthritis and osteoporosis.  She has a seizure disorder.  She has been anemic.  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  My assessment is that overall she is doing pretty well.  PLAN:  Plan is for her to continue with her current medications.  No changes.     Tryce Surratt L. Juanetta Gosling, M.D.     ELH/MEDQ  D:  12/26/2012  T:  12/26/2012  Job:  [id number]

## 2013-01-19 NOTE — Progress Notes (Signed)
She was seen 01/18/2013 at the skilled care facility. She's doing well. She has no new complaints. She still has some pain in her legs but it has improved. She has not had any seizures. No change in her chronic back pain. She had another urinary tract infection which is been treated  She is awake and alert. She is sitting in a wheelchair. Her chest is clear. Her heart is regular. She has had an enucleation of the left eye  She has severe osteoporosis which is better after treatment. She had multiple compression fractures. She's had leg pain which I think is neuropathic in nature and that's being treated and it seems to have improved. She has seizure disorder which is stable. She had a cancer in her sinus and has had an enucleation of her left eye.  I don't think there's anything else to add

## 2013-03-21 NOTE — Progress Notes (Signed)
This is a note to document a visit of October 2014 which was dictated but has not made it to her chart. She says she feels about the same. She has no new complaints. She has been having recurrent urinary tract infections but is doing pretty well with that now. She is still requiring oxygen at night. She has not had any seizures. No new problems with back pain but it is still present  She is in a wheelchair. She looks comfortable. 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 Her chest is clear.  She has multiple medical problems. She has had severe osteoporosis and has had multiple compression fractures and that seems better but it has left her with chronic pain. She has mild anemia. She has recurrent urinary tract infections. 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 She has seizure disorder but that is stable  No change in treatments

## 2013-03-21 NOTE — Progress Notes (Signed)
This is an 79 year old who has been at the skilled care facility for several years. My visit was on 03/20/2013. She has no new complaints. She says her breathing is okay and she is still using oxygen at night. Her pain is pretty well controlled. She has not had any seizures.  She is sitting in a wheelchair. She looks comfortable. Her chest is clear. Her heart is regular. Her left eye has been surgically removed.  She has multiple medical problems. She has a seizure disorder but none in about 8 months to a year. She's had recurrent urinary tract infections and that is stable. She has had severe osteoporosis multiple compression fractures  No change in treatments

## 2013-05-24 NOTE — Progress Notes (Signed)
NAME:  [name], [name]               ACCOUNT NO.:  [id number]  MEDICAL RECORD NO.:  [id number]  LOCATION:                                 FACILITY:  PHYSICIAN:  Citlali Gautney L. Juanetta GoslingHawkins, M.D.DATE OF BIRTH:  12-05-1922  DATE OF PROCEDURE:  05/09/2013 DATE OF DISCHARGE:                                PROGRESS NOTE   SUBJECTIVE:  [name] is overall about the same.  She said she feels fairly well.  She has no new complaints.  Her breathing is okay.  She is not having any new problems with pain.  PHYSICAL EXAMINATION:  GENERAL:  She is awake and alert.  She has had an enucleation of her eye. CHEST:  Clear.  She is in a wheelchair.  ASSESSMENT:  She has multiple medical problems, but basically is doing pretty well.  PLAN:  For no change.   05/23/2012.  SUBJECTIVE:  [name] is complaining of a sore throat and cough and congestion.  She says she is otherwise about the same.  PHYSICAL EXAMINATION:  GENERAL:  She is awake and alert.  She is in a wheelchair.  Her throat is relatively clear. CHEST:  Clear.  She sounds like she has nasal congestion.  ASSESSMENT:  Then is that, she has an upper respiratory infection and she has had significant trouble with this in the past.  She is allergic to a number of medicines.  She has seizure disorder.  This has required ICU hospitalization twice with respiratory failure.  She has not had seizures in some time.  She has had an enucleation of her eye.  She has severe osteoporosis which is unchanged.     Keyen Marban L. Juanetta GoslingHawkins, M.D.     ELH/MEDQ  D:  05/23/2013  T:  05/24/2013  Job:  [id number]

## 2013-06-08 ENCOUNTER — Encounter (HOSPITAL_COMMUNITY): Payer: Self-pay | Admitting: Emergency Medicine

## 2013-06-08 ENCOUNTER — Emergency Department (HOSPITAL_COMMUNITY): Payer: Medicare Other

## 2013-06-08 ENCOUNTER — Inpatient Hospital Stay (HOSPITAL_COMMUNITY)
Admission: EM | Admit: 2013-06-08 | Discharge: 2013-06-14 | DRG: 195 | Disposition: A | Discharge status: Skilled Nursing Facility | Payer: Medicare Other | Attending: Pulmonary Disease | Admitting: Pulmonary Disease

## 2013-06-08 DIAGNOSIS — J1100 Influenza due to unidentified influenza virus with unspecified type of pneumonia: Principal | ICD-10-CM | POA: Diagnosis present

## 2013-06-08 DIAGNOSIS — K219 Gastro-esophageal reflux disease without esophagitis: Secondary | ICD-10-CM | POA: Diagnosis present

## 2013-06-08 DIAGNOSIS — F3289 Other specified depressive episodes: Secondary | ICD-10-CM | POA: Diagnosis present

## 2013-06-08 DIAGNOSIS — M810 Age-related osteoporosis without current pathological fracture: Secondary | ICD-10-CM | POA: Diagnosis present

## 2013-06-08 DIAGNOSIS — J189 Pneumonia, unspecified organism: Secondary | ICD-10-CM | POA: Diagnosis present

## 2013-06-08 DIAGNOSIS — M1990 Unspecified osteoarthritis, unspecified site: Secondary | ICD-10-CM | POA: Diagnosis present

## 2013-06-08 DIAGNOSIS — F329 Major depressive disorder, single episode, unspecified: Secondary | ICD-10-CM | POA: Diagnosis present

## 2013-06-08 DIAGNOSIS — Z79899 Other long term (current) drug therapy: Secondary | ICD-10-CM

## 2013-06-08 DIAGNOSIS — Y95 Nosocomial condition: Secondary | ICD-10-CM

## 2013-06-08 DIAGNOSIS — F411 Generalized anxiety disorder: Secondary | ICD-10-CM | POA: Diagnosis present

## 2013-06-08 DIAGNOSIS — Z8522 Personal history of malignant neoplasm of nasal cavities, middle ear, and accessory sinuses: Secondary | ICD-10-CM

## 2013-06-08 DIAGNOSIS — Z66 Do not resuscitate: Secondary | ICD-10-CM | POA: Diagnosis present

## 2013-06-08 DIAGNOSIS — G40909 Epilepsy, unspecified, not intractable, without status epilepticus: Secondary | ICD-10-CM | POA: Diagnosis present

## 2013-06-08 DIAGNOSIS — G8929 Other chronic pain: Secondary | ICD-10-CM | POA: Diagnosis present

## 2013-06-08 DIAGNOSIS — E039 Hypothyroidism, unspecified: Secondary | ICD-10-CM | POA: Diagnosis present

## 2013-06-08 DIAGNOSIS — I10 Essential (primary) hypertension: Secondary | ICD-10-CM | POA: Diagnosis present

## 2013-06-08 NOTE — ED Notes (Signed)
Per report from [facility] Nurse, pt reportedly fell and hit the back of her head on the back of her wheelchair and has a large hematoma.  Pt denies pain to the back of her head and no obvious hematoma palpated or visualized.  Pt is awake and alert, denies pain at any place on her body.

## 2013-06-09 DIAGNOSIS — Y95 Nosocomial condition: Secondary | ICD-10-CM

## 2013-06-09 DIAGNOSIS — J189 Pneumonia, unspecified organism: Secondary | ICD-10-CM

## 2013-06-09 LAB — BASIC METABOLIC PANEL
BUN: 17 mg/dL (ref 6–23)
CHLORIDE: 93 meq/L — AB (ref 96–112)
CO2: 33 mEq/L — ABNORMAL HIGH (ref 19–32)
CREATININE: 1.09 mg/dL (ref 0.50–1.10)
Calcium: 8.9 mg/dL (ref 8.4–10.5)
GFR, EST AFRICAN AMERICAN: 51 mL/min — AB (ref >90)
GFR, EST NON AFRICAN AMERICAN: 44 mL/min — AB (ref >90)
Glucose, Bld: 116 mg/dL — ABNORMAL HIGH (ref 70–99)
POTASSIUM: 4.1 meq/L (ref 3.7–5.3)
Sodium: 134 mEq/L — ABNORMAL LOW (ref 137–147)

## 2013-06-09 LAB — CBC WITH DIFFERENTIAL/PLATELET
BASOS ABS: 0 10*3/uL (ref 0.0–0.1)
Basophils Relative: 1 % (ref 0–1)
EOS PCT: 0 % (ref 0–5)
Eosinophils Absolute: 0 10*3/uL (ref 0.0–0.7)
HCT: 30.2 % — ABNORMAL LOW (ref 36.0–46.0)
Hemoglobin: 9.8 g/dL — ABNORMAL LOW (ref 12.0–15.0)
LYMPHS ABS: 0.5 10*3/uL — AB (ref 0.7–4.0)
Lymphocytes Relative: 13 % (ref 12–46)
MCH: 29.1 pg (ref 26.0–34.0)
MCHC: 32.5 g/dL (ref 30.0–36.0)
MCV: 89.6 fL (ref 78.0–100.0)
MONO ABS: 0.3 10*3/uL (ref 0.1–1.0)
MONOS PCT: 7 % (ref 3–12)
NEUTROS PCT: 79 % — AB (ref 43–77)
Neutro Abs: 2.9 10*3/uL (ref 1.7–7.7)
PLATELETS: 114 10*3/uL — AB (ref 150–400)
RBC: 3.37 MIL/uL — AB (ref 3.87–5.11)
RDW: 12.9 % (ref 11.5–15.5)
WBC: 3.7 10*3/uL — AB (ref 4.0–10.5)

## 2013-06-09 LAB — URINE MICROSCOPIC-ADD ON

## 2013-06-09 LAB — INFLUENZA PANEL BY PCR (TYPE A & B)
H1N1 flu by pcr: NOT DETECTED
INFLAPCR: POSITIVE — AB
Influenza B By PCR: NEGATIVE

## 2013-06-09 LAB — TROPONIN I
Troponin I: <0.3 ng/mL (ref <0.30)
Troponin I: <0.3 ng/mL (ref <0.30)
Troponin I: <0.3 ng/mL (ref <0.30)

## 2013-06-09 LAB — URINALYSIS, ROUTINE W REFLEX MICROSCOPIC
BILIRUBIN URINE: NEGATIVE
Glucose, UA: NEGATIVE mg/dL
Hgb urine dipstick: NEGATIVE
KETONES UR: NEGATIVE mg/dL
LEUKOCYTES UA: NEGATIVE
NITRITE: NEGATIVE
PROTEIN: 30 mg/dL — AB
Specific Gravity, Urine: 1.025 (ref 1.005–1.030)
UROBILINOGEN UA: 0.2 mg/dL (ref 0.0–1.0)
pH: 7.5 (ref 5.0–8.0)

## 2013-06-09 LAB — LACTIC ACID, PLASMA: Lactic Acid, Venous: 0.7 mmol/L (ref 0.5–2.2)

## 2013-06-09 LAB — MRSA PCR SCREENING: MRSA BY PCR: POSITIVE — AB

## 2013-06-09 MED ORDER — LEVETIRACETAM 500 MG PO TABS
250.0000 mg | ORAL_TABLET | Freq: Two times a day (BID) | ORAL | Status: DC
  Administered 2013-06-09 – 2013-06-14 (×11): 250 mg via ORAL
  Filled 2013-06-09 (×11): qty 1

## 2013-06-09 MED ORDER — SODIUM CHLORIDE 0.9 % IV SOLN
INTRAVENOUS | Status: DC
  Administered 2013-06-09 – 2013-06-12 (×8): via INTRAVENOUS

## 2013-06-09 MED ORDER — ESCITALOPRAM OXALATE 10 MG PO TABS
10.0000 mg | ORAL_TABLET | Freq: Every day | ORAL | Status: DC
  Administered 2013-06-09 – 2013-06-14 (×6): 10 mg via ORAL
  Filled 2013-06-09 (×6): qty 1

## 2013-06-09 MED ORDER — ADULT MULTIVITAMIN W/MINERALS CH
1.0000 | ORAL_TABLET | Freq: Every day | ORAL | Status: DC
  Administered 2013-06-09 – 2013-06-14 (×6): 1 via ORAL
  Filled 2013-06-09 (×6): qty 1

## 2013-06-09 MED ORDER — DEXTROSE 5 % IV SOLN: 500.0000 mg | INTRAVENOUS | Status: DC

## 2013-06-09 MED ORDER — PHENYTOIN SODIUM EXTENDED 100 MG PO CAPS
200.0000 mg | ORAL_CAPSULE | Freq: Every day | ORAL | Status: DC
Start: 2013-06-09 — End: 2013-06-09
  Administered 2013-06-09: 200 mg via ORAL
  Filled 2013-06-09: qty 2

## 2013-06-09 MED ORDER — ZOLPIDEM TARTRATE 5 MG PO TABS
5.0000 mg | ORAL_TABLET | Freq: Every day | ORAL | Status: DC
  Administered 2013-06-09 – 2013-06-13 (×5): 5 mg via ORAL
  Filled 2013-06-09 (×5): qty 1

## 2013-06-09 MED ORDER — LEVOTHYROXINE SODIUM 75 MCG PO TABS
175.0000 ug | ORAL_TABLET | Freq: Every day | ORAL | Status: DC
  Administered 2013-06-10 – 2013-06-14 (×5): 175 ug via ORAL
  Filled 2013-06-09 (×10): qty 1

## 2013-06-09 MED ORDER — LEVOTHYROXINE SODIUM 50 MCG PO TABS
50.0000 ug | ORAL_TABLET | Freq: Every day | ORAL | Status: DC
  Administered 2013-06-09: 50 ug via ORAL
  Filled 2013-06-09: qty 2

## 2013-06-09 MED ORDER — CHLORHEXIDINE GLUCONATE CLOTH 2 % EX PADS
6.0000 | MEDICATED_PAD | Freq: Every day | CUTANEOUS | Status: AC
  Administered 2013-06-10 – 2013-06-13 (×4): 6 via TOPICAL

## 2013-06-09 MED ORDER — DEXTROSE 5 % IV SOLN
1.0000 g | Freq: Once | INTRAVENOUS | Status: AC
  Administered 2013-06-09: 1 g via INTRAVENOUS
  Filled 2013-06-09: qty 1

## 2013-06-09 MED ORDER — DEXTROSE 5 % IV SOLN
INTRAVENOUS | Status: AC
  Filled 2013-06-09: qty 1

## 2013-06-09 MED ORDER — FENTANYL 50 MCG/HR TD PT72
50.0000 ug | MEDICATED_PATCH | TRANSDERMAL | Status: DC
  Administered 2013-06-11: 50 ug via TRANSDERMAL
  Filled 2013-06-09: qty 1

## 2013-06-09 MED ORDER — HYDROCODONE-ACETAMINOPHEN 5-325 MG PO TABS
1.0000 | ORAL_TABLET | ORAL | Status: DC | PRN
  Administered 2013-06-10: 1 via ORAL
  Administered 2013-06-11 – 2013-06-13 (×9): 2 via ORAL
  Filled 2013-06-09: qty 2
  Filled 2013-06-09: qty 1
  Filled 2013-06-09 (×8): qty 2

## 2013-06-09 MED ORDER — MUPIROCIN 2 % EX OINT
1.0000 "application " | TOPICAL_OINTMENT | Freq: Two times a day (BID) | CUTANEOUS | Status: AC
  Administered 2013-06-09 – 2013-06-13 (×10): 1 via NASAL
  Filled 2013-06-09 (×2): qty 22

## 2013-06-09 MED ORDER — ONDANSETRON HCL 4 MG PO TABS: 4.0000 mg | ORAL_TABLET | Freq: Four times a day (QID) | ORAL | Status: DC | PRN

## 2013-06-09 MED ORDER — ACETAMINOPHEN 160 MG/5ML PO SOLN
ORAL | Status: AC
  Administered 2013-06-09: 650 mg
  Filled 2013-06-09: qty 20.3

## 2013-06-09 MED ORDER — PANTOPRAZOLE SODIUM 40 MG PO TBEC
40.0000 mg | DELAYED_RELEASE_TABLET | Freq: Every day | ORAL | Status: DC
  Administered 2013-06-09 – 2013-06-14 (×6): 40 mg via ORAL
  Filled 2013-06-09 (×6): qty 1

## 2013-06-09 MED ORDER — VANCOMYCIN HCL IN DEXTROSE 1-5 GM/200ML-% IV SOLN
1000.0000 mg | Freq: Once | INTRAVENOUS | Status: AC
  Administered 2013-06-09: 1000 mg via INTRAVENOUS
  Filled 2013-06-09: qty 200

## 2013-06-09 MED ORDER — ACETAMINOPHEN 325 MG PO TABS
650.0000 mg | ORAL_TABLET | Freq: Four times a day (QID) | ORAL | Status: DC | PRN
  Administered 2013-06-09 – 2013-06-10 (×2): 650 mg via ORAL
  Filled 2013-06-09 (×2): qty 2

## 2013-06-09 MED ORDER — ACETAMINOPHEN 325 MG PO TABS: 650.0000 mg | ORAL_TABLET | Freq: Once | ORAL | Status: DC

## 2013-06-09 MED ORDER — DEXTROSE 5 % IV SOLN
1.0000 g | INTRAVENOUS | Status: DC
  Administered 2013-06-10 – 2013-06-14 (×5): 1 g via INTRAVENOUS
  Filled 2013-06-09 (×6): qty 1

## 2013-06-09 MED ORDER — LEVOTHYROXINE SODIUM 100 MCG PO TABS
200.0000 ug | ORAL_TABLET | Freq: Every day | ORAL | Status: DC
  Administered 2013-06-09: 200 ug via ORAL
  Filled 2013-06-09: qty 2

## 2013-06-09 MED ORDER — ONDANSETRON HCL 4 MG/2ML IJ SOLN: 4.0000 mg | Freq: Four times a day (QID) | INTRAMUSCULAR | Status: DC | PRN

## 2013-06-09 MED ORDER — CALCIUM CARBONATE-VITAMIN D 500-200 MG-UNIT PO TABS
1.0000 | ORAL_TABLET | Freq: Three times a day (TID) | ORAL | Status: DC
  Administered 2013-06-09 – 2013-06-14 (×17): 1 via ORAL
  Filled 2013-06-09 (×25): qty 1

## 2013-06-09 MED ORDER — VANCOMYCIN HCL 500 MG IV SOLR
500.0000 mg | INTRAVENOUS | Status: DC
Start: 2013-06-10 — End: 2013-06-14
  Administered 2013-06-10 – 2013-06-13 (×4): 500 mg via INTRAVENOUS
  Filled 2013-06-09 (×6): qty 500

## 2013-06-09 MED ORDER — LORATADINE 10 MG PO TABS
10.0000 mg | ORAL_TABLET | Freq: Every day | ORAL | Status: DC
  Administered 2013-06-09 – 2013-06-14 (×6): 10 mg via ORAL
  Filled 2013-06-09 (×6): qty 1

## 2013-06-09 MED ORDER — SODIUM CHLORIDE 0.9 % IV SOLN: Freq: Once | INTRAVENOUS | Status: DC

## 2013-06-09 MED ORDER — DOCUSATE SODIUM 100 MG PO CAPS
100.0000 mg | ORAL_CAPSULE | Freq: Two times a day (BID) | ORAL | Status: DC
  Administered 2013-06-09 – 2013-06-14 (×11): 100 mg via ORAL
  Filled 2013-06-09 (×11): qty 1

## 2013-06-09 MED ORDER — CLARITHROMYCIN 500 MG PO TABS: 500.0000 mg | ORAL_TABLET | Freq: Once | ORAL | Status: DC

## 2013-06-09 MED ORDER — FENTANYL 50 MCG/HR TD PT72: 50.0000 ug | MEDICATED_PATCH | TRANSDERMAL | Status: DC

## 2013-06-09 MED ORDER — METOPROLOL TARTRATE 25 MG PO TABS
25.0000 mg | ORAL_TABLET | Freq: Every day | ORAL | Status: DC
  Administered 2013-06-09 – 2013-06-14 (×6): 25 mg via ORAL
  Filled 2013-06-09 (×6): qty 1

## 2013-06-09 MED ORDER — OSELTAMIVIR PHOSPHATE 30 MG PO CAPS
30.0000 mg | ORAL_CAPSULE | Freq: Every day | ORAL | Status: AC
  Administered 2013-06-09 – 2013-06-13 (×5): 30 mg via ORAL
  Filled 2013-06-09 (×5): qty 1

## 2013-06-09 MED ORDER — NITROFURANTOIN MACROCRYSTAL 50 MG PO CAPS
50.0000 mg | ORAL_CAPSULE | Freq: Every day | ORAL | Status: DC
  Administered 2013-06-09 – 2013-06-10 (×2): 50 mg via ORAL
  Administered 2013-06-11: 22:00:00 via ORAL
  Administered 2013-06-12 – 2013-06-13 (×2): 50 mg via ORAL
  Filled 2013-06-09 (×7): qty 1

## 2013-06-09 MED ORDER — ROPINIROLE HCL 1 MG PO TABS
4.0000 mg | ORAL_TABLET | Freq: Every day | ORAL | Status: DC
Start: 2013-06-09 — End: 2013-06-14
  Administered 2013-06-09 – 2013-06-13 (×5): 4 mg via ORAL
  Filled 2013-06-09 (×7): qty 4

## 2013-06-09 NOTE — Clinical Social Work Psychosocial (Signed)
    Clinical Social Work Department BRIEF PSYCHOSOCIAL ASSESSMENT 06/09/2013  Patient:  [name]     Account Number:  [id number]     Admit date:  06/08/2013  Clinical Social Worker:  Santa GeneraUNNINGHAM,Obrien Huskins, CLINICAL SOCIAL WORKER  Date/Time:  06/09/2013 12:00 N  Referred by:  CSW  Date Referred:  06/09/2013 Referred for  SNF Placement   Other Referral:   Interview type:  Family Other interview type:   Also spoke w [name], [facility]    PSYCHOSOCIAL DATA Living Status:  FACILITY Ad[facility] from facility[facility] Level of care:  Skilled Nursing Facility Primary support name:  Tobe SosGay Citty Primary support relationship to patient:  CHILD, MINOR Degree of support available:   Supportive family,long term  SNF placement    CURRENT CONCERNS Current Concerns  Post-Acute Placement   Other Concerns:    SOCIAL WORK ASSESSMENT / PLAN CSW attempted to assess patient directly, patient was in process of being bathed.  Called daughter, Tobe SosGay Citty, who confirmed patient [facility], expressed no concerns about her placement.  Daughter has already paid bed hold so patient can return to SNF when medically stable.    Spoke w [facility] admissions, facility is willing for patient to return at discharge.  Patient is "gonne do what she wants to do" - very independent of spirit.  Ambulates primarily in wheelchair, is able to transfer self to chair from bed and get herself to toilet.  Eats independently.  Requires help w bathing only.  Family is involved and visits frequently.  No concerns expressed by facility.    CSW will continue to monitor and update SNF and family as needed.  [facility].   Assessment/plan status:   Other assessment/ plan:   Information/referral to community resources:   None needed, [facility]    PATIENT'S/FAMILY'S RESPONSE TO PLAN OF CARE: Family and facility agreeable to discharge plan of [facility]         Santa GeneraAnne Julann Mcgilvray, LCSW Clinical Social Worker [phone])

## 2013-06-09 NOTE — Progress Notes (Signed)
This is an assumption of care note. She was admitted early this morning with a fall at her skilled nursing facility. When she was brought to the emergency department for evaluation she was found to have a temperature of 102.3 and had what appeared to be pneumonia on x-ray. She was very sleepy and she is still very sleepy. She does wake up enough to talk to me. Her exam otherwise shows her chest shows bilateral rhonchi. 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 Her heart is regular  She has healthcare associated pneumonia. This is somewhat complicated by the fact that she has multiple medication allergies. I will plan to continue current treatments. She has DO NOT RESUSCITATE status

## 2013-06-09 NOTE — Progress Notes (Signed)
UR chart review completed.  

## 2013-06-09 NOTE — ED Provider Notes (Signed)
Medical screening examination/treatment/procedure(s) were conducted as a shared visit with non-physician practitioner(s) and myself.  I personally evaluated the patient during the encounter.  2:01 AM Patient is awake and alert in no acute respiratory distress. She is not tachycardic or tachypneic. She is not hypoxic. We will start IV antibiotics and have her admitted for healthcare associated pneumonia.    Hanley SeamenJohn L Fumiko Cham, MD 06/09/13 [phone]

## 2013-06-09 NOTE — Care Management Note (Addendum)
    Page 1 of 1   06/14/2013     3:27:44 PM   CARE MANAGEMENT NOTE 06/14/2013  Patient:  [name],[name]   Account Number:  [id number]  Date Initiated:  06/09/2013  Documentation initiated by:  Sharrie RothmanBLACKWELL,Nili Honda Kaiser  Subjective/Objective Assessment:   [facility]. Pt will return to facility at discharge.     Action/Plan:   CSW to arrange discharge to facility when medically stable.   Anticipated DC Date:  06/12/2013   Anticipated DC Plan:  SKILLED NURSING FACILITY  In-house referral  Clinical Social Worker      DC Planning Services  CM consult      Choice offered to / List presented to:             Status of service:  Completed, signed off Medicare Important Message given?  YES (If response is "NO", the following Medicare IM given date fields will be blank) Date Medicare IM given:  06/14/2013 Date Additional Medicare IM given:    Discharge Disposition:  SKILLED NURSING FACILITY  Per UR Regulation:    If discussed at Long Length of Stay Meetings, dates discussed:    Comments:  06/14/13 1525 Arlyss Queenammy Mykaylah Ballman, RN BSN CM pt [name]. CSW to arrange discharge to facility.  06/09/13 1340 Arlyss Queenammy Scout Gumbs, RN BSN CM

## 2013-06-09 NOTE — H&P (Signed)
PCP:   Alonza Bogus, MD   Chief Complaint:  Fall  HPI: 80 year old female who    has a past medical history of Seizures; Aspiration pneumonia; Dysphagia; Osteoporosis; Thyroid disease; Depression; MRSA (methicillin resistant staph aureus) culture positive; Respiratory failure; Hypokalemia; Reflux; Vertebral compression fracture; History of sinus cancer (1970's); and Chronic pain. 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 There was a question of large hematoma but no hematoma was found on examination. Chest x-ray in the ED reveals pneumonia also patient was found to have temperature of 102.3, patient received ambien and is very somnolent at this time so history is obtained from patient's daughter at bedside. In the ED patient received vancomycin.  Allergies:   Allergies  Allergen Reactions  . Aricept [Donepezil Hcl]   . Bactrim [Sulfamethoxazole-Trimethoprim]   . Ciprofloxacin   . Darvocet [Propoxyphene N-Acetaminophen]   . Namenda [Memantine Hcl]   . Penicillins   . Vibramycin [Doxycycline Calcium]       Past Medical History  Diagnosis Date  . Seizures   . Aspiration pneumonia   . Dysphagia   . Osteoporosis   . Thyroid disease   . Depression   . MRSA (methicillin resistant staph aureus) culture positive   . Respiratory failure   . Hypokalemia   . Reflux   . Vertebral compression fracture     thoracic and lumbar  . History of sinus cancer 1970's    left side  . Chronic pain     Past Surgical History  Procedure Laterality Date  . Kyphoplasty      Lumbar and thoracic  . Palate surgery  1970's    removal of hard and soft palate on left side  . Enucleation Left 1970's    left eye  . Cholecystectomy    . Appendectomy    . Cesarean section      Prior to  Admission medications   Medication Sig Start Date End Date Taking? Authorizing Provider  AZO-CRANBERRY PO Take 1 tablet by mouth 2 (two) times daily.    Historical Provider, MD  calcium-vitamin D (OSCAL WITH D) 500-200 MG-UNIT per tablet Take 1 tablet by mouth 3 (three) times daily.    Historical Provider, MD  docusate sodium (COLACE) 100 MG capsule Take 100 mg by mouth 2 (two) times daily.    Historical Provider, MD  Ensure Plus (ENSURE PLUS) LIQD Take 237 mLs by mouth daily. Drinks with morning meds.    Historical Provider, MD  escitalopram (LEXAPRO) 10 MG tablet Take 10 mg by mouth daily.    Historical Provider, MD  fentaNYL (DURAGESIC - DOSED MCG/HR) 50 MCG/HR Place 1 patch onto the skin every other day.    Historical Provider, MD  HYDROcodone-acetaminophen (NORCO/VICODIN) 5-325 MG per tablet Take 1 tablet by mouth 3 (three) times daily.    Historical Provider, MD  iron polysaccharides (NIFEREX) 150 MG capsule Take 150 mg by mouth 2 (two) times daily.    Historical Provider, MD  levETIRAcetam (KEPPRA) 250 MG tablet Take 250 mg by mouth every 12 (twelve) hours.    Historical Provider, MD  levothyroxine (SYNTHROID, LEVOTHROID) 200 MCG tablet Take 200 mcg by mouth daily.    Historical Provider, MD  levothyroxine (SYNTHROID, LEVOTHROID) 50 MCG tablet Take 50 mcg by  mouth daily.    Historical Provider, MD  loratadine (CLARITIN) 10 MG tablet Take 10 mg by mouth daily.    Historical Provider, MD  metoprolol tartrate (LOPRESSOR) 25 MG tablet Take 25 mg by mouth daily.    Historical Provider, MD  Multiple Vitamin (MULTIVITAMIN WITH MINERALS) TABS Take 1 tablet by mouth daily.    Historical Provider, MD  nitrofurantoin (MACRODANTIN) 50 MG capsule Take 50 mg by mouth at bedtime.    Historical Provider, MD  omeprazole (PRILOSEC) 20 MG capsule Take 20 mg by mouth at bedtime.    Historical Provider, MD  phenytoin (DILANTIN) 100 MG ER capsule Take 200 mg by mouth daily.    Historical Provider, MD  rOPINIRole  (REQUIP) 4 MG tablet Take 4 mg by mouth at bedtime.    Historical Provider, MD  sennosides-docusate sodium (SENOKOT-S) 8.6-50 MG tablet Take 1 tablet by mouth 2 (two) times daily.    Historical Provider, MD  zolpidem (AMBIEN) 5 MG tablet Take 5 mg by mouth at bedtime.    Historical Provider, MD    Social History:  reports that she has never smoked. She does not have any smokeless tobacco history on file. She reports that she does not drink alcohol or use illicit drugs.  No family history on file.   All the positives are listed in BOLD  Review of Systems:  HEENT: Headache,  runny nose, sore throat Neck: Hypothyroidism, hyperthyroidism,,lymphadenopathy Chest : Shortness of breath, history of COPD, Asthma Heart : Chest pain, history of coronary arterey disease GI:  Nausea, vomiting, diarrhea, constipation, GERD GU: Dysuria, urgency, frequency of urination, hematuria Neuro: Stroke, seizures, syncope Psych: Depression, anxiety, hallucinations   Physical Exam: Blood pressure 115/45, pulse 75, temperature 102.3 F (39.1 C), temperature source Rectal, resp. rate 18, SpO2 100.00%. Constitutional:   Patient is a well-developed and well-nourished female in no acute distress and cooperative with exam. Head: Normocephalic and atraumatic Mouth: Mucus membranes moist Neck: Supple, No Thyromegaly Cardiovascular: RRR, S1 normal, S2 normal Pulmonary/Chest: CTAB, no wheezes, rales, or rhonchi Abdominal: Soft. Non-tender, non-distended, bowel sounds are normal, no masses, organomegaly, or guarding present.  Neurological: A&O x3, no focal deficit noted at this time Extremities : No Cyanosis, Clubbing or Edema   Labs on Admission:  Results for orders placed during the hospital encounter of 06/08/13 (from the past 48 hour(s))  CBC WITH DIFFERENTIAL     Status: Abnormal   Collection Time    06/09/13  1:18 AM      Result Value Range   WBC 3.7 (*) 4.0 - 10.5 K/uL   RBC 3.37 (*) 3.87 - 5.11 MIL/uL    Hemoglobin 9.8 (*) 12.0 - 15.0 g/dL   HCT 30.2 (*) 36.0 - 46.0 %   MCV 89.6  78.0 - 100.0 fL   MCH 29.1  26.0 - 34.0 pg   MCHC 32.5  30.0 - 36.0 g/dL   RDW 12.9  11.5 - 15.5 %   Platelets 114 (*) 150 - 400 K/uL   Comment: RESULT REPEATED AND VERIFIED     PLATELET COUNT CONFIRMED BY SMEAR     LARGE PLATELETS PRESENT   Neutrophils Relative % 79 (*) 43 - 77 %   Lymphocytes Relative 13  12 - 46 %   Monocytes Relative 7  3 - 12 %   Eosinophils Relative 0  0 - 5 %   Basophils Relative 1  0 - 1 %   Neutro Abs 2.9  1.7 - 7.7 K/uL  Lymphs Abs 0.5 (*) 0.7 - 4.0 K/uL   Monocytes Absolute 0.3  0.1 - 1.0 K/uL   Eosinophils Absolute 0.0  0.0 - 0.7 K/uL   Basophils Absolute 0.0  0.0 - 0.1 K/uL   RBC Morphology STOMATOCYTES     Smear Review LARGE PLATELETS PRESENT     Comment: PLATELET COUNT CONFIRMED BY SMEAR  BASIC METABOLIC PANEL     Status: Abnormal   Collection Time    06/09/13  1:18 AM      Result Value Range   Sodium 134 (*) 137 - 147 mEq/L   Potassium 4.1  3.7 - 5.3 mEq/L   Chloride 93 (*) 96 - 112 mEq/L   CO2 33 (*) 19 - 32 mEq/L   Glucose, Bld 116 (*) 70 - 99 mg/dL   BUN 17  6 - 23 mg/dL   Creatinine, Ser 1.09  0.50 - 1.10 mg/dL   Calcium 8.9  8.4 - 10.5 mg/dL   GFR calc non Af Amer 44 (*) >90 mL/min   GFR calc Af Amer 51 (*) >90 mL/min   Comment: (NOTE)     The eGFR has been calculated using the CKD EPI equation.     This calculation has not been validated in all clinical situations.     eGFR's persistently <90 mL/min signify possible Chronic Kidney     Disease.  LACTIC ACID, PLASMA     Status: None   Collection Time    06/09/13  1:20 AM      Result Value Range   Lactic Acid, Venous 0.7  0.5 - 2.2 mmol/L  URINALYSIS, ROUTINE W REFLEX MICROSCOPIC     Status: Abnormal   Collection Time    06/09/13  1:37 AM      Result Value Range   Color, Urine YELLOW  YELLOW   APPearance CLEAR  CLEAR   Specific Gravity, Urine 1.025  1.005 - 1.030   pH 7.5  5.0 - 8.0   Glucose, UA  NEGATIVE  NEGATIVE mg/dL   Hgb urine dipstick NEGATIVE  NEGATIVE   Bilirubin Urine NEGATIVE  NEGATIVE   Ketones, ur NEGATIVE  NEGATIVE mg/dL   Protein, ur 30 (*) NEGATIVE mg/dL   Urobilinogen, UA 0.2  0.0 - 1.0 mg/dL   Nitrite NEGATIVE  NEGATIVE   Leukocytes, UA NEGATIVE  NEGATIVE  URINE MICROSCOPIC-ADD ON     Status: Abnormal   Collection Time    06/09/13  1:37 AM      Result Value Range   Squamous Epithelial / LPF FEW (*) RARE   RBC / HPF 0-2  <3 RBC/hpf   Bacteria, UA MANY (*) RARE   Urine-Other MUCOUS PRESENT      Radiological Exams on Admission: Dg Chest 1 View  06/09/2013   CLINICAL DATA:  Status post fall, with back pain.  Cough and fever.  EXAM: CHEST - 1 VIEW  COMPARISON:  Chest radiograph performed 08/13/2012  FINDINGS: Lung expansion is mildly decreased. Right basilar airspace opacity raises concern for pneumonia. No definite pleural effusion or pneumothorax is seen. Pulmonary vascularity is at the upper limits of normal.  The cardiomediastinal silhouette is mildly enlarged. No acute osseous abnormalities are identified. The patient is status post vertebroplasty at multiple levels along the upper lumbar spine. A large hiatal hernia is suspected.  IMPRESSION: 1. Right basilar airspace opacity raises concern for pneumonia. Lungs mildly hypoexpanded. 2. Mild cardiomegaly. 3. No displaced rib fractures seen. 4. Suspect large hiatal hernia.   Electronically Signed  By: Garald Balding M.D.   On: 06/09/2013 00:20   Ct Head Wo Contrast  06/09/2013   CLINICAL DATA:  Fall from toilet, asymptomatic.  EXAM: CT HEAD WITHOUT CONTRAST  CT CERVICAL SPINE WITHOUT CONTRAST  TECHNIQUE: Multidetector CT imaging of the head and cervical spine was performed following the standard protocol without intravenous contrast. Multiplanar CT image reconstructions of the cervical spine were also generated.  COMPARISON:  CT of head and cervical spine July 16, 2012  FINDINGS: CT HEAD FINDINGS  The ventricles  and sulci are normal for age. No intraparenchymal hemorrhage, mass effect nor midline shift. Patchy supratentorial white matter hypodensities are within normal range for patient's age and though non-specific suggest sequelae of chronic small vessel ischemic disease. No acute large vascular territory infarcts.  No abnormal extra-axial fluid collections. Basal cisterns are patent. Moderate calcific atherosclerosis of the carotid siphons and vertebral arteries.  No skull fracture. Status post left orbital exenteration, and left hemi maxillectomy with prosthesis. Minimal fluid density in right mastoid tip. Right ocular lens implant.  CT CERVICAL SPINE FINDINGS  Accentuated cervical lordosis. Stable grade 1 C6-7 anterolisthesis without spondylolysis. Cervical vertebral bodies are intact. Similar degenerative disc disease. Decreased bone mineral density without destructive bony lesions. 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 C1-2 articulation maintained with moderate arthropathy. Osteopenia without destructive bony lesions. Mild calcific atherosclerosis of the carotid bulbs.  IMPRESSION: CT head: No acute intracranial process. Involutional changes. Moderate white matter changes suggest chronic small vessel ischemic disease.  Status post left orbital exenteration and left hemi maxillectomy.  CT cervical spine: No acute fracture. Stable grade 1 C6-7 anterolisthesis without spondylolysis. Accentuated cervical lordosis.   Electronically Signed   By: Elon Alas   On: 06/09/2013 00:47   Ct Cervical Spine Wo Contrast  06/09/2013   CLINICAL DATA:  Fall from toilet, asymptomatic.  EXAM: CT HEAD WITHOUT CONTRAST  CT CERVICAL SPINE WITHOUT CONTRAST  TECHNIQUE: Multidetector CT imaging of the head and cervical spine was performed following the standard protocol without intravenous contrast. Multiplanar CT image reconstructions of the cervical spine were also generated.   COMPARISON:  CT of head and cervical spine July 16, 2012  FINDINGS: CT HEAD FINDINGS  The ventricles and sulci are normal for age. No intraparenchymal hemorrhage, mass effect nor midline shift. Patchy supratentorial white matter hypodensities are within normal range for patient's age and though non-specific suggest sequelae of chronic small vessel ischemic disease. No acute large vascular territory infarcts.  No abnormal extra-axial fluid collections. Basal cisterns are patent. Moderate calcific atherosclerosis of the carotid siphons and vertebral arteries.  No skull fracture. Status post left orbital exenteration, and left hemi maxillectomy with prosthesis. Minimal fluid density in right mastoid tip. Right ocular lens implant.  CT CERVICAL SPINE FINDINGS  Accentuated cervical lordosis. Stable grade 1 C6-7 anterolisthesis without spondylolysis. Cervical vertebral bodies are intact. Similar degenerative disc disease. Decreased bone mineral density without destructive bony lesions. 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 C1-2 articulation maintained with moderate arthropathy. Osteopenia without destructive bony lesions. Mild calcific atherosclerosis of the carotid bulbs.  IMPRESSION: CT head: No acute intracranial process. Involutional changes. Moderate white matter changes suggest chronic small vessel ischemic disease.  Status post left orbital exenteration and left hemi maxillectomy.  CT cervical spine: No acute fracture. Stable grade 1 C6-7 anterolisthesis without spondylolysis. Accentuated cervical lordosis.   Electronically Signed   By: Elon Alas   On: 06/09/2013  00:47    Assessment/Plan Principal Problem:   HCAP (healthcare-associated pneumonia) Active Problems:   Hospital acquired PNA  Healthcare associated pneumonia Chest x-ray shows right basilar pneumonia Patient will be started on vancomycin and cefepime, will obtain blood cultures  x2. Will continue with IV normal saline at 100 mL per hour  Seizure disorder Continue with Keppra and the phenytoin  Chronic back pain Patient has chronic back pain due to vertebral fractures She has history of osteoporosis, we'll continue with fentanyl and Vicodin when necessary  Hypothyroidism Continue Synthroid  Code status: Patient is DO NOT RESUSCITATE  Family discussion: Discussed with daughters at bedside   Time Spent on Admission: 60 min  [facility] Pager: [phone] 06/09/2013, 3:29 AM  If 7PM-7AM, please contact night-coverage  [email or web address]  Password [email or web address]

## 2013-06-09 NOTE — Progress Notes (Signed)
ANTIBIOTIC CONSULT NOTE - INITIAL  Pharmacy Consult for vancomycin & cefepime Indication: HCAP  Allergies  Allergen Reactions  . Aricept [Donepezil Hcl]   . Bactrim [Sulfamethoxazole-Trimethoprim]   . Ciprofloxacin   . Darvocet [Propoxyphene N-Acetaminophen]   . Namenda [Memantine Hcl]   . Penicillins   . Vibramycin [Doxycycline Calcium]     Patient Measurements: Height: 4\' 10"  (147.3 cm) Weight: 98 lb 15.8 oz (44.9 kg) IBW/kg (Calculated) : 40.9   Vital Signs: Temp: 98.7 F (37.1 C) (01/21 0320) Temp src: Oral (01/21 0320) BP: 113/47 mmHg (01/21 0327) Pulse Rate: 74 (01/21 0327) Intake/Output from previous day:   Intake/Output from this shift:    Labs:  Recent Labs  06/09/13 0118  WBC 3.7*  HGB 9.8*  PLT 114*  CREATININE 1.09   Estimated Creatinine Clearance: 23 ml/min (by C-G formula based on Cr of 1.09). No results found for this basename: VANCOTROUGH, Leodis BinetVANCOPEAK, VANCORANDOM, GENTTROUGH, GENTPEAK, GENTRANDOM, TOBRATROUGH, TOBRAPEAK, TOBRARND, AMIKACINPEAK, AMIKACINTROU, AMIKACIN,  in the last 72 hours   Microbiology: Recent Results (from the past 720 hour(s))  CULTURE, BLOOD (ROUTINE X 2)     Status: None   Collection Time    06/09/13  2:16 AM      Result Value Range Status   Specimen Description BLOOD LEFT ANTECUBITAL   Final   Special Requests     Final   Value: BOTTLES DRAWN AEROBIC AND ANAEROBIC AEB 3CC ANA 2CC   Culture PENDING   Incomplete   Report Status PENDING   Incomplete  CULTURE, BLOOD (ROUTINE X 2)     Status: None   Collection Time    06/09/13  2:16 AM      Result Value Range Status   Specimen Description BLOOD LEFT FOREARM   Final   Special Requests BOTTLES DRAWN AEROBIC AND ANAEROBIC 5CC EACH   Final   Culture PENDING   Incomplete   Report Status PENDING   Incomplete    Medical History: Past Medical History  Diagnosis Date  . Seizures   . Aspiration pneumonia   . Dysphagia   . Osteoporosis   . Thyroid disease   . Depression    . MRSA (methicillin resistant staph aureus) culture positive   . Respiratory failure   . Hypokalemia   . Reflux   . Vertebral compression fracture     thoracic and lumbar  . History of sinus cancer 1970's    left side  . Chronic pain     Medications:  Scheduled:  . acetaminophen  650 mg Oral Once  . calcium-vitamin D  1 tablet Oral TID WC  . ceFEPime (MAXIPIME) IV  1 g Intravenous Once  . [START ON 06/10/2013] ceFEPime (MAXIPIME) IV  500 mg Intravenous Q24H  . docusate sodium  100 mg Oral BID  . escitalopram  10 mg Oral Daily  . [START ON 06/11/2013] fentaNYL  50 mcg Transdermal Q72H  . levETIRAcetam  250 mg Oral Q12H  . levothyroxine  200 mcg Oral QAC breakfast  . levothyroxine  50 mcg Oral QAC breakfast  . loratadine  10 mg Oral Daily  . metoprolol tartrate  25 mg Oral Daily  . multivitamin with minerals  1 tablet Oral Daily  . nitrofurantoin  50 mg Oral QHS  . pantoprazole  40 mg Oral Daily  . phenytoin  200 mg Oral Daily  . rOPINIRole  4 mg Oral QHS  . [START ON 06/10/2013] vancomycin  500 mg Intravenous Q24H  . zolpidem  5 mg  Oral QHS   Infusions:  . sodium chloride 100 mL/hr at 06/09/13 0344   PRN: HYDROcodone-acetaminophen, ondansetron (ZOFRAN) IV, ondansetron  Assessment: 73yr small female  (58in & 98 lb) with probable HCAP, complicated by multiple antibiotic allergies ( no reactions noted though), low WBC, on nitrofurantoin UTI prophylaxis (?), hx of seizures, hx chronic back pain / osteoporosis, hypothyroidism.  Not sure that SCr reflects est CrCl.    Goal of Therapy:  Desire vancomycin trough 88mcg/ml for suspected pneumonia.  Will adjust cefepime for CrCl 66ml/min  Plan:  1. Vancomycin 1gm IV already approx 2:30am.  Will start vancomycin 500mg  IV q24hr tomorrow morning. 2.  Cefepime 1gm dose given now, and then 500mg  IV q24h starting tomorrow morning 3.  Recommend stopping nitrofurantoin (both vancomycin & cefepime will accumulate in urine) 4.  Recommend  measuring actual CrCl with urine collection 5.  Will check serum trough steady state vancomycin level.  Shirley Muscat E 06/09/2013,4:15 AM

## 2013-06-09 NOTE — Progress Notes (Signed)
Notified MD of patient's temperature.  Will follow new orders and give tylenol and order blood cultures.    Gizella Belleville R

## 2013-06-09 NOTE — ED Provider Notes (Signed)
CSN: [id number]     Arrival date & time 06/08/13  2222 History   First MD Initiated Contact with Patient 06/08/13 2246     Chief Complaint  Patient presents with  . Fall   (Consider location/radiation/quality/duration/timing/severity/associated sxs/prior Treatment) HPI Comments: Patient is an 80 year old female resident of a local nursing facility who presents to the emergency department following a fall. The nursing facility reports the patient fell and hit the back of her head on the back of a wheelchair. The patient has had right hip surgery in the past. 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 The patient denies any loss of consciousness. There is no report from the nursing center of any loss of consciousness. There was report of a" large hematoma". The patient's daughter is in the room with her and states that she has not seen a hematoma. She also confirms that the patient is very much at her baseline at this time.  The patient's daughter also states however that she was told that her mother had a temperature earlier during the day and had been doing a lot of coughing. She requested this be evaluated.  Patient is a 80 y.o. female presenting with fall. The history is provided by the patient, the nursing home and a relative.  Fall Associated symptoms include arthralgias, coughing and a fever. Pertinent negatives include no abdominal pain, chest pain or neck pain.    Past Medical History  Diagnosis Date  . Seizures   . Aspiration pneumonia   . Dysphagia   . Osteoporosis   . Thyroid disease   . Depression   . MRSA (methicillin resistant staph aureus) culture positive   . Respiratory failure   . Hypokalemia   . Reflux   . Vertebral compression fracture     thoracic and lumbar  . History of sinus cancer 1970's    left side  . Chronic pain    Past Surgical History  Procedure Laterality Date  .  Kyphoplasty      Lumbar and thoracic  . Palate surgery  1970's    removal of hard and soft palate on left side  . Enucleation Left 1970's    left eye  . Cholecystectomy    . Appendectomy    . Cesarean section     No family history on file. History  Substance Use Topics  . Smoking status: Never Smoker   . Smokeless tobacco: Not on file  . Alcohol Use: No   OB History   Grav Para Term Preterm Abortions TAB SAB Ect Mult Living                 Review of Systems  Constitutional: Positive for fever. Negative for activity change.       All ROS Neg except as noted in HPI  HENT: Negative for nosebleeds.   Eyes: Negative for photophobia and discharge.  Respiratory: Positive for cough. Negative for shortness of breath and wheezing.   Cardiovascular: Negative for chest pain and palpitations.  Gastrointestinal: Negative for abdominal pain and blood in stool.  Genitourinary: Negative for dysuria, frequency and hematuria.  Musculoskeletal: Positive for arthralgias and back pain. Negative for neck pain.  Skin: Negative.   Neurological: Positive for seizures. Negative for dizziness and speech difficulty.  Psychiatric/Behavioral: Negative for hallucinations and confusion.       Depression    Allergies  Aricept; Bactrim; Ciprofloxacin; Darvocet; Namenda;  Penicillins; and Vibramycin  Home Medications   Current Outpatient Rx  Name  Route  Sig  Dispense  Refill  . AZO-CRANBERRY PO   Oral   Take 1 tablet by mouth 2 (two) times daily.         . calcium-vitamin D (OSCAL WITH D) 500-200 MG-UNIT per tablet   Oral   Take 1 tablet by mouth 3 (three) times daily.         Marland Kitchen docusate sodium (COLACE) 100 MG capsule   Oral   Take 100 mg by mouth 2 (two) times daily.         . Ensure Plus (ENSURE PLUS) LIQD   Oral   Take 237 mLs by mouth daily. Drinks with morning meds.         Marland Kitchen escitalopram (LEXAPRO) 10 MG tablet   Oral   Take 10 mg by mouth daily.         . fentaNYL  (DURAGESIC - DOSED MCG/HR) 50 MCG/HR   Transdermal   Place 1 patch onto the skin every other day.         Marland Kitchen HYDROcodone-acetaminophen (NORCO/VICODIN) 5-325 MG per tablet   Oral   Take 1 tablet by mouth 3 (three) times daily.         . iron polysaccharides (NIFEREX) 150 MG capsule   Oral   Take 150 mg by mouth 2 (two) times daily.         Marland Kitchen levETIRAcetam (KEPPRA) 250 MG tablet   Oral   Take 250 mg by mouth every 12 (twelve) hours.         Marland Kitchen levothyroxine (SYNTHROID, LEVOTHROID) 200 MCG tablet   Oral   Take 200 mcg by mouth daily.         Marland Kitchen levothyroxine (SYNTHROID, LEVOTHROID) 50 MCG tablet   Oral   Take 50 mcg by mouth daily.         Marland Kitchen loratadine (CLARITIN) 10 MG tablet   Oral   Take 10 mg by mouth daily.         . metoprolol tartrate (LOPRESSOR) 25 MG tablet   Oral   Take 25 mg by mouth daily.         . Multiple Vitamin (MULTIVITAMIN WITH MINERALS) TABS   Oral   Take 1 tablet by mouth daily.         . nitrofurantoin (MACRODANTIN) 50 MG capsule   Oral   Take 50 mg by mouth at bedtime.         Marland Kitchen omeprazole (PRILOSEC) 20 MG capsule   Oral   Take 20 mg by mouth at bedtime.         . phenytoin (DILANTIN) 100 MG ER capsule   Oral   Take 200 mg by mouth daily.         Marland Kitchen rOPINIRole (REQUIP) 4 MG tablet   Oral   Take 4 mg by mouth at bedtime.         . sennosides-docusate sodium (SENOKOT-S) 8.6-50 MG tablet   Oral   Take 1 tablet by mouth 2 (two) times daily.         Marland Kitchen zolpidem (AMBIEN) 5 MG tablet   Oral   Take 5 mg by mouth at bedtime.          There were no vitals taken for this visit. Physical Exam  HENT:   no hematoma palpated. Negative Battle's sign.  Eyes:  Left eye surgically removed.  Cardiovascular:  Murmur heard.  Musculoskeletal:  There is good range of motion of the upper extremities. There is good range of motion of the left lower extremity. There is decreased range of motion of the right lower extremity. It is  of note that this is not new, as the patient has had hip surgery in the past. There is no bruise to the right or left hip area.    ED Course  Procedures (including critical care time) Labs Review Labs Reviewed  CBC WITH DIFFERENTIAL  BASIC METABOLIC PANEL  URINALYSIS, ROUTINE W REFLEX MICROSCOPIC   Imaging Review Dg Chest 1 View  06/09/2013   CLINICAL DATA:  Status post fall, with back pain.  Cough and fever.  EXAM: CHEST - 1 VIEW  COMPARISON:  Chest radiograph performed 08/13/2012  FINDINGS: Lung expansion is mildly decreased. Right basilar airspace opacity raises concern for pneumonia. No definite pleural effusion or pneumothorax is seen. Pulmonary vascularity is at the upper limits of normal.  The cardiomediastinal silhouette is mildly enlarged. No acute osseous abnormalities are identified. The patient is status post vertebroplasty at multiple levels along the upper lumbar spine. A large hiatal hernia is suspected.  IMPRESSION: 1. Right basilar airspace opacity raises concern for pneumonia. Lungs mildly hypoexpanded. 2. Mild cardiomegaly. 3. No displaced rib fractures seen. 4. Suspect large hiatal hernia.   Electronically Signed   By: Roanna Raider M.D.   On: 06/09/2013 00:20   Ct Head Wo Contrast  06/09/2013   CLINICAL DATA:  Fall from toilet, asymptomatic.  EXAM: CT HEAD WITHOUT CONTRAST  CT CERVICAL SPINE WITHOUT CONTRAST  TECHNIQUE: Multidetector CT imaging of the head and cervical spine was performed following the standard protocol without intravenous contrast. Multiplanar CT image reconstructions of the cervical spine were also generated.  COMPARISON:  CT of head and cervical spine July 16, 2012  FINDINGS: CT HEAD FINDINGS  The ventricles and sulci are normal for age. No intraparenchymal hemorrhage, mass effect nor midline shift. Patchy supratentorial white matter hypodensities are within normal range for patient's age and though non-specific suggest sequelae of chronic small vessel  ischemic disease. No acute large vascular territory infarcts.  No abnormal extra-axial fluid collections. Basal cisterns are patent. Moderate calcific atherosclerosis of the carotid siphons and vertebral arteries.  No skull fracture. Status post left orbital exenteration, and left hemi maxillectomy with prosthesis. Minimal fluid density in right mastoid tip. Right ocular lens implant.  CT CERVICAL SPINE FINDINGS  Accentuated cervical lordosis. Stable grade 1 C6-7 anterolisthesis without spondylolysis. Cervical vertebral bodies are intact. Similar degenerative disc disease. Decreased bone mineral density without destructive bony lesions. 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 C1-2 articulation maintained with moderate arthropathy. Osteopenia without destructive bony lesions. Mild calcific atherosclerosis of the carotid bulbs.  IMPRESSION: CT head: No acute intracranial process. Involutional changes. Moderate white matter changes suggest chronic small vessel ischemic disease.  Status post left orbital exenteration and left hemi maxillectomy.  CT cervical spine: No acute fracture. Stable grade 1 C6-7 anterolisthesis without spondylolysis. Accentuated cervical lordosis.   Electronically Signed   By: Awilda Metro   On: 06/09/2013 00:47   Ct Cervical Spine Wo Contrast  06/09/2013   CLINICAL DATA:  Fall from toilet, asymptomatic.  EXAM: CT HEAD WITHOUT CONTRAST  CT CERVICAL SPINE WITHOUT CONTRAST  TECHNIQUE: Multidetector CT imaging of the head and cervical spine was performed following the standard protocol without intravenous contrast. Multiplanar CT image reconstructions of the cervical spine were also generated.  COMPARISON:  CT of head and cervical spine July 16, 2012  FINDINGS: CT HEAD FINDINGS  The ventricles and sulci are normal for age. No intraparenchymal hemorrhage, mass effect nor midline shift. Patchy supratentorial white matter hypodensities are  within normal range for patient's age and though non-specific suggest sequelae of chronic small vessel ischemic disease. No acute large vascular territory infarcts.  No abnormal extra-axial fluid collections. Basal cisterns are patent. Moderate calcific atherosclerosis of the carotid siphons and vertebral arteries.  No skull fracture. Status post left orbital exenteration, and left hemi maxillectomy with prosthesis. Minimal fluid density in right mastoid tip. Right ocular lens implant.  CT CERVICAL SPINE FINDINGS  Accentuated cervical lordosis. Stable grade 1 C6-7 anterolisthesis without spondylolysis. Cervical vertebral bodies are intact. Similar degenerative disc disease. Decreased bone mineral density without destructive bony lesions. 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 C1-2 articulation maintained with moderate arthropathy. Osteopenia without destructive bony lesions. Mild calcific atherosclerosis of the carotid bulbs.  IMPRESSION: CT head: No acute intracranial process. Involutional changes. Moderate white matter changes suggest chronic small vessel ischemic disease.  Status post left orbital exenteration and left hemi maxillectomy.  CT cervical spine: No acute fracture. Stable grade 1 C6-7 anterolisthesis without spondylolysis. Accentuated cervical lordosis.   Electronically Signed   By: Awilda Metroourtnay  Bloomer   On: 06/09/2013 00:47    EKG Interpretation   None       MDM  No diagnosis found. **I have reviewed nursing notes, vital signs, and all appropriate lab and imaging results for this patient.*  CT scan of the head shows no acute intracranial process present. There is moderate white matter changes consistent with chronic small vessel ischemic disease. CT scan of the cervical spine reveals accentuated cervical lordosis. A stable grade 1 C6-C7 anterolisthesis. This is not new and has been seen on other studies. Chest x-ray reveals a right basilar  airspace opacity consistent with possible pneumonia. There no rib fractures seen there is suspected a large hiatal hernia present.  These findings have been discussed with the patient and the patient's family. Will obtain complete blood count, basic metabolic panel, and urinalysis.  Temp 102 rectally. Pt's care to be continued by Dr Read DriversMolpus. 0147.  Kathie DikeHobson M Cari Burgo, PA-C 06/09/13 [phone]

## 2013-06-09 NOTE — Progress Notes (Signed)
ANTIBIOTIC CONSULT NOTE  Pharmacy Consult for Vancomycin & Cefepime Indication: HCAP  Allergies  Allergen Reactions  . Aricept [Donepezil Hcl]   . Bactrim [Sulfamethoxazole-Trimethoprim]   . Ciprofloxacin   . Darvocet [Propoxyphene N-Acetaminophen]   . Namenda [Memantine Hcl]   . Penicillins   . Vibramycin [Doxycycline Calcium]     Patient Measurements: Height: 4\' 10"  (147.3 cm) Weight: 98 lb 15.8 oz (44.9 kg) IBW/kg (Calculated) : 40.9   Vital Signs: Temp: 98.7 F (37.1 C) (01/21 0320) Temp src: Oral (01/21 0320) BP: 111/50 mmHg (01/21 0641) Pulse Rate: 60 (01/21 0641) Intake/Output from previous day: 01/20 0701 - 01/21 0700 In: 226.7 [I.V.:226.7] Out: -  Intake/Output from this shift:    Labs:  Recent Labs  06/09/13 0118  WBC 3.7*  HGB 9.8*  PLT 114*  CREATININE 1.09   Estimated Creatinine Clearance: 23 ml/min (by C-G formula based on Cr of 1.09). No results found for this basename: VANCOTROUGH, Leodis Binet, VANCORANDOM, GENTTROUGH, GENTPEAK, GENTRANDOM, TOBRATROUGH, TOBRAPEAK, TOBRARND, AMIKACINPEAK, AMIKACINTROU, AMIKACIN,  in the last 72 hours   Microbiology: Recent Results (from the past 720 hour(s))  CULTURE, BLOOD (ROUTINE X 2)     Status: None   Collection Time    06/09/13  2:16 AM      Result Value Range Status   Specimen Description BLOOD LEFT ANTECUBITAL   Final   Special Requests     Final   Value: BOTTLES DRAWN AEROBIC AND ANAEROBIC AEB 3CC ANA 2CC   Culture PENDING   Incomplete   Report Status PENDING   Incomplete  CULTURE, BLOOD (ROUTINE X 2)     Status: None   Collection Time    06/09/13  2:16 AM      Result Value Range Status   Specimen Description BLOOD LEFT FOREARM   Final   Special Requests BOTTLES DRAWN AEROBIC AND ANAEROBIC 5CC EACH   Final   Culture PENDING   Incomplete   Report Status PENDING   Incomplete  MRSA PCR SCREENING     Status: Abnormal   Collection Time    06/09/13  3:13 AM      Result Value Range Status   MRSA by  PCR POSITIVE (*) NEGATIVE Final   Comment:            The GeneXpert MRSA Assay (FDA     approved for NASAL specimens     only), is one component of a     comprehensive MRSA colonization     surveillance program. It is not     intended to diagnose MRSA     infection nor to guide or     monitor treatment for     MRSA infections.     RESULT CALLED TO, READ BACK BY AND VERIFIED WITH:     HOWELL T AT 0456 ON [id number] BY FORSYTH K    Medical History: Past Medical History  Diagnosis Date  . Seizures   . Aspiration pneumonia   . Dysphagia   . Osteoporosis   . Thyroid disease   . Depression   . MRSA (methicillin resistant staph aureus) culture positive   . Respiratory failure   . Hypokalemia   . Reflux   . Vertebral compression fracture     thoracic and lumbar  . History of sinus cancer 1970's    left side  . Chronic pain     Medications:  Scheduled:  . acetaminophen  650 mg Oral Once  . calcium-vitamin D  1 tablet Oral  TID WC  . [START ON 06/10/2013] ceFEPime (MAXIPIME) IV  1 g Intravenous Q24H  . Chlorhexidine Gluconate Cloth  6 each Topical Q0600  . docusate sodium  100 mg Oral BID  . escitalopram  10 mg Oral Daily  . [START ON 06/11/2013] fentaNYL  50 mcg Transdermal Q72H  . levETIRAcetam  250 mg Oral Q12H  . levothyroxine  200 mcg Oral QAC breakfast  . levothyroxine  50 mcg Oral QAC breakfast  . loratadine  10 mg Oral Daily  . metoprolol tartrate  25 mg Oral Daily  . multivitamin with minerals  1 tablet Oral Daily  . mupirocin ointment  1 application Nasal BID  . nitrofurantoin  50 mg Oral QHS  . pantoprazole  40 mg Oral Daily  . phenytoin  200 mg Oral Daily  . rOPINIRole  4 mg Oral QHS  . [START ON 06/10/2013] vancomycin  500 mg Intravenous Q24H  . zolpidem  5 mg Oral QHS   Infusions:  . sodium chloride 100 mL/hr at 06/09/13 0344   PRN: HYDROcodone-acetaminophen, ondansetron (ZOFRAN) IV, ondansetron  Assessment: 80yo F with CXR +PNA.   She is febrile on  admission with low WBC.  Lactic acid is WNL.  Renal function is at patient's baseline.  She has multiple drug allergies noted, but has tolerated 1st doses of Vanc, Cefepime without reactions.   Cefepime 1/21>> Vancomycin 1/21>>  Goal of Therapy:  Vancomycin trough 15/7620mcg/ml  Plan:  1. Cefepime 1gm IV q24h  2.  Vancomycin 500mg  IV q24hr  3.  Check Vancomycin trough at steady state 4.  Recommend stopping nitrofurantoin while on IV antibiotics.  5.  Monitor renal function and cx data   Elson ClanLilliston, Narmeen Kerper Michelle 06/09/2013,7:38 AM

## 2013-06-10 NOTE — Clinical Social Work Note (Signed)
Spoke w patient directly, confirmed that [facility] at discharge, expressed no concerns.  Santa GeneraAnne Cunningham, LCSW Clinical Social Worker [phone])

## 2013-06-11 NOTE — Progress Notes (Signed)
Subjective: She says she feels a little better. She still coughing. She is in with a new complaints.  Objective: Vital signs in last 24 hours: Temp:  [97.9 F (36.6 C)-99 F (37.2 C)] 97.9 F (36.6 C) (01/23 0653) Pulse Rate:  [76-85] 85 (01/23 0653) Resp:  [20] 20 (01/23 0653) BP: (124-140)/(61-66) 140/61 mmHg (01/23 0653) SpO2:  [95 %-97 %] 96 % (01/23 0653) Weight change:  Last BM Date:  (unknown)  Intake/Output from previous day: 01/22 0701 - 01/23 0700 In: 2560 [P.O.:360; I.V.:2200] Out: 850 [Urine:850]  PHYSICAL EXAM General appearance: alert, cooperative and mild distress Resp: rhonchi bilaterally Cardio: regular rate and rhythm, S1, S2 normal, no murmur, click, rub or gallop GI: soft, non-tender; bowel sounds normal; no masses,  no organomegaly Extremities: extremities normal, atraumatic, no cyanosis or edema  Lab Results:    Basic Metabolic Panel:  Recent Labs  09/81/1901/21/15 0118  NA 134*  K 4.1  CL 93*  CO2 33*  GLUCOSE 116*  BUN 17  CREATININE 1.09  CALCIUM 8.9   Liver Function Tests: No results found for this basename: AST, ALT, ALKPHOS, BILITOT, PROT, ALBUMIN,  in the last 72 hours No results found for this basename: LIPASE, AMYLASE,  in the last 72 hours No results found for this basename: AMMONIA,  in the last 72 hours CBC:  Recent Labs  06/09/13 0118  WBC 3.7*  NEUTROABS 2.9  HGB 9.8*  HCT 30.2*  MCV 89.6  PLT 114*   Cardiac Enzymes:  Recent Labs  06/09/13 0327 06/09/13 0919 06/09/13 1615  TROPONINI <0.30 <0.30 <0.30   BNP: No results found for this basename: PROBNP,  in the last 72 hours D-Dimer: No results found for this basename: DDIMER,  in the last 72 hours CBG: No results found for this basename: GLUCAP,  in the last 72 hours Hemoglobin A1C: No results found for this basename: HGBA1C,  in the last 72 hours Fasting Lipid Panel: No results found for this basename: CHOL, HDL, LDLCALC, TRIG, CHOLHDL, LDLDIRECT,  in the last  72 hours Thyroid Function Tests: No results found for this basename: TSH, T4TOTAL, FREET4, T3FREE, THYROIDAB,  in the last 72 hours Anemia Panel: No results found for this basename: VITAMINB12, FOLATE, FERRITIN, TIBC, IRON, RETICCTPCT,  in the last 72 hours Coagulation: No results found for this basename: LABPROT, INR,  in the last 72 hours Urine Drug Screen: Drugs of Abuse  No results found for this basename: labopia, cocainscrnur, labbenz, amphetmu, thcu, labbarb    Alcohol Level: No results found for this basename: ETH,  in the last 72 hours Urinalysis:  Recent Labs  06/09/13 0137  COLORURINE YELLOW  LABSPEC 1.025  PHURINE 7.5  GLUCOSEU NEGATIVE  HGBUR NEGATIVE  BILIRUBINUR NEGATIVE  KETONESUR NEGATIVE  PROTEINUR 30*  UROBILINOGEN 0.2  NITRITE NEGATIVE  LEUKOCYTESUR NEGATIVE   Misc. Labs:  ABGS No results found for this basename: PHART, PCO2, PO2ART, TCO2, HCO3,  in the last 72 hours CULTURES Recent Results (from the past 240 hour(s))  CULTURE, BLOOD (ROUTINE X 2)     Status: None   Collection Time    06/09/13  2:16 AM      Result Value Range Status   Specimen Description BLOOD LEFT ANTECUBITAL   Final   Special Requests     Final   Value: BOTTLES DRAWN AEROBIC AND ANAEROBIC AEB=3CC ANA=2CC   Culture NO GROWTH 2 DAYS   Final   Report Status PENDING   Incomplete  CULTURE, BLOOD (ROUTINE  X 2)     Status: None   Collection Time    06/09/13  2:16 AM      Result Value Range Status   Specimen Description BLOOD LEFT FOREARM   Final   Special Requests BOTTLES DRAWN AEROBIC AND ANAEROBIC 5CC EACH   Final   Culture NO GROWTH 2 DAYS   Final   Report Status PENDING   Incomplete  MRSA PCR SCREENING     Status: Abnormal   Collection Time    06/09/13  3:13 AM      Result Value Range Status   MRSA by PCR POSITIVE (*) NEGATIVE Final   Comment:            The GeneXpert MRSA Assay (FDA     approved for NASAL specimens     only), is one component of a     comprehensive  MRSA colonization     surveillance program. It is not     intended to diagnose MRSA     infection nor to guide or     monitor treatment for     MRSA infections.     RESULT CALLED TO, READ BACK BY AND VERIFIED WITH:     HOWELL T AT 0456 ON [id number] BY FORSYTH K  CULTURE, BLOOD (ROUTINE X 2)     Status: None   Collection Time    06/09/13 11:49 PM      Result Value Range Status   Specimen Description BLOOD RIGHT ANTECUBITAL   Final   Special Requests     Final   Value: BOTTLES DRAWN AEROBIC AND ANAEROBIC AEB=6CC ANA=4CC   Culture NO GROWTH 2 DAYS   Final   Report Status PENDING   Incomplete  CULTURE, BLOOD (ROUTINE X 2)     Status: None   Collection Time    06/09/13 11:54 PM      Result Value Range Status   Specimen Description BLOOD RIGHT FOREARM   Final   Special Requests     Final   Value: BOTTLES DRAWN AEROBIC AND ANAEROBIC AEB=4CC ANA=2CC   Culture NO GROWTH 2 DAYS   Final   Report Status PENDING   Incomplete   Studies/Results: No results found.  Medications:  Prior to Admission:  Prescriptions prior to admission  Medication Sig Dispense Refill  . acetaminophen (TYLENOL) 325 MG tablet Take 650 mg by mouth every 4 (four) hours as needed. Pain/fever      . carboxymethylcellulose (REFRESH PLUS) 0.5 % SOLN Place 1 drop into the right eye every 6 (six) hours as needed. Dry eyes      . ENSURE PLUS (ENSURE PLUS) LIQD Take 237 mLs by mouth 2 (two) times daily between meals.      . fentaNYL (DURAGESIC - DOSED MCG/HR) 50 MCG/HR Place 50 mcg onto the skin every other day.      . furosemide (LASIX) 20 MG tablet Take 20 mg by mouth daily.      Marland Kitchen HYDROcodone-acetaminophen (NORCO/VICODIN) 5-325 MG per tablet Take 1 tablet by mouth every 6 (six) hours as needed. pain      . HYDROcodone-acetaminophen (NORCO/VICODIN) 5-325 MG per tablet Take 1 tablet by mouth 3 (three) times daily.      . iron polysaccharides (NIFEREX) 150 MG capsule Take 150 mg by mouth daily.      Marland Kitchen levalbuterol (XOPENEX)  0.63 MG/3ML nebulizer solution Take 0.63 mg by nebulization every 4 (four) hours as needed for wheezing or shortness of breath.      Marland Kitchen  levothyroxine (SYNTHROID, LEVOTHROID) 175 MCG tablet Take 175 mcg by mouth daily.      . AZO-CRANBERRY PO Take 1 tablet by mouth 2 (two) times daily.      . calcium-vitamin D (OSCAL WITH D) 500-200 MG-UNIT per tablet Take 1 tablet by mouth 3 (three) times daily.      Marland Kitchen docusate sodium (COLACE) 100 MG capsule Take 100 mg by mouth 2 (two) times daily.      Marland Kitchen escitalopram (LEXAPRO) 10 MG tablet Take 10 mg by mouth daily.      Marland Kitchen levETIRAcetam (KEPPRA) 250 MG tablet Take 250 mg by mouth every 12 (twelve) hours.      Marland Kitchen loratadine (CLARITIN) 10 MG tablet Take 10 mg by mouth daily.      . metoprolol tartrate (LOPRESSOR) 25 MG tablet Take 25 mg by mouth daily.      . Multiple Vitamin (MULTIVITAMIN WITH MINERALS) TABS Take 1 tablet by mouth daily.      . nitrofurantoin (MACRODANTIN) 50 MG capsule Take 50 mg by mouth at bedtime.      Marland Kitchen omeprazole (PRILOSEC) 20 MG capsule Take 20 mg by mouth at bedtime.      Marland Kitchen rOPINIRole (REQUIP) 4 MG tablet Take 4 mg by mouth at bedtime.      . sennosides-docusate sodium (SENOKOT-S) 8.6-50 MG tablet Take 1 tablet by mouth 2 (two) times daily.      Marland Kitchen zolpidem (AMBIEN) 5 MG tablet Take 5 mg by mouth at bedtime.       Scheduled: . acetaminophen  650 mg Oral Once  . calcium-vitamin D  1 tablet Oral TID WC  . ceFEPime (MAXIPIME) IV  1 g Intravenous Q24H  . Chlorhexidine Gluconate Cloth  6 each Topical Q0600  . docusate sodium  100 mg Oral BID  . escitalopram  10 mg Oral Daily  . fentaNYL  50 mcg Transdermal Q72H  . levETIRAcetam  250 mg Oral Q12H  . levothyroxine  175 mcg Oral QAC breakfast  . loratadine  10 mg Oral Daily  . metoprolol tartrate  25 mg Oral Daily  . multivitamin with minerals  1 tablet Oral Daily  . mupirocin ointment  1 application Nasal BID  . nitrofurantoin  50 mg Oral QHS  . oseltamivir  30 mg Oral Daily  .  pantoprazole  40 mg Oral Daily  . rOPINIRole  4 mg Oral QHS  . vancomycin  500 mg Intravenous Q24H  . zolpidem  5 mg Oral QHS   Continuous: . sodium chloride 100 mL/hr at 06/10/13 2311   ZOX:[id number], HYDROcodone-acetaminophen, ondansetron (ZOFRAN) IV, ondansetron  Assesment: She was admitted with healthcare associated pneumonia and seems to be slowly improving Principal Problem:   HCAP (healthcare-associated pneumonia) Active Problems:   Hospital acquired PNA    Plan: Continue current treatments try to get her up today.    LOS: 3 days   Arshi Duarte L 06/11/2013, 9:02 AM

## 2013-06-11 NOTE — Progress Notes (Signed)
NAMMarland Kitchen:  [name], Briceida               ACCOUNT NO.:  [id number]  MEDICAL RECORD NO.:  [id number]  LOCATION:  A309                          FACILITY:  [facility]  PHYSICIAN:  Elan Brainerd L. Juanetta GoslingHawkins, M.D.DATE OF BIRTH:  10-21-1922  DATE OF PROCEDURE: DATE OF DISCHARGE:                                PROGRESS NOTE   [name] was admitted yesterday with pneumonia which is healthcare associated.  She says she is feeling better.  She does not remember my visit yesterday.  She is more alert now.  She has had a temperature as high as 102.6.  Exam this morning, GENERAL:  She is awake and alert.  More responsive. VITAL SIGNS:  Temperature is 99.3, pulse 86, respirations 20, blood pressure 137/61, O2 sats 94%. CHEST:  Her chest is still with a lot of rhonchi bilaterally. HEART:  Regular. ABDOMEN:  Soft. 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  ASSESSMENT:  She has pneumonia.  This is healthcare associated.  She has influenza which is being treated.  She has seizure disorder which has been pretty well controlled.  She has severe osteoarthritis and osteoporosis which are better.  My plan is to continue with current treatments.     Rona Tomson L. Juanetta GoslingHawkins, M.D.     ELH/MEDQ  D:  06/10/2013  T:  06/11/2013  Job:  [id number]

## 2013-06-11 NOTE — Clinical Social Work Note (Signed)
[facility] admissions notified of current treatment plan, and MD plan to discharge patient approx 1/26 to finish course of IV antibiotics at SNF.  Facility agreeable to plan.  Santa GeneraAnne Aneesh Faller, LCSW Clinical Social Worker [phone])

## 2013-06-12 MED ORDER — GUAIFENESIN-DM 100-10 MG/5ML PO SYRP
5.0000 mL | ORAL_SOLUTION | ORAL | Status: DC | PRN
  Administered 2013-06-12 – 2013-06-13 (×2): 5 mL via ORAL
  Filled 2013-06-12 (×2): qty 5

## 2013-06-12 NOTE — Progress Notes (Signed)
Subjective: She says she feels better. She has no new complaints. She is still coughing and able to cough up some sputum  Objective: Vital signs in last 24 hours: Temp:  [97.5 F (36.4 C)-98.1 F (36.7 C)] 97.5 F (36.4 C) (01/24 0421) Pulse Rate:  [68-78] 78 (01/24 0421) Resp:  [20] 20 (01/24 0421) BP: (139-163)/(53-78) 144/72 mmHg (01/24 0829) SpO2:  [94 %-98 %] 94 % (01/24 0421) Weight change:  Last BM Date: 06/11/13  Intake/Output from previous day: 01/23 0701 - 01/24 0700 In: 3521.7 [P.O.:340; I.V.:3081.7; IV Piggyback:100] Out: 450 [Urine:450]  PHYSICAL EXAM General appearance: alert, cooperative and mild distress Resp: clear to auscultation bilaterally Cardio: regular rate and rhythm, S1, S2 normal, no murmur, click, rub or gallop GI: soft, non-tender; bowel sounds normal; no masses,  no organomegaly Extremities: extremities normal, atraumatic, no cyanosis or edema  Lab Results:    Basic Metabolic Panel: No results found for this basename: NA, K, CL, CO2, GLUCOSE, BUN, CREATININE, CALCIUM, MG, PHOS,  in the last 72 hours Liver Function Tests: No results found for this basename: AST, ALT, ALKPHOS, BILITOT, PROT, ALBUMIN,  in the last 72 hours No results found for this basename: LIPASE, AMYLASE,  in the last 72 hours No results found for this basename: AMMONIA,  in the last 72 hours CBC: No results found for this basename: WBC, NEUTROABS, HGB, HCT, MCV, PLT,  in the last 72 hours Cardiac Enzymes:  Recent Labs  06/09/13 0919 06/09/13 1615  TROPONINI <0.30 <0.30   BNP: No results found for this basename: PROBNP,  in the last 72 hours D-Dimer: No results found for this basename: DDIMER,  in the last 72 hours CBG: No results found for this basename: GLUCAP,  in the last 72 hours Hemoglobin A1C: No results found for this basename: HGBA1C,  in the last 72 hours Fasting Lipid Panel: No results found for this basename: CHOL, HDL, LDLCALC, TRIG, CHOLHDL,  LDLDIRECT,  in the last 72 hours Thyroid Function Tests: No results found for this basename: TSH, T4TOTAL, FREET4, T3FREE, THYROIDAB,  in the last 72 hours Anemia Panel: No results found for this basename: VITAMINB12, FOLATE, FERRITIN, TIBC, IRON, RETICCTPCT,  in the last 72 hours Coagulation: No results found for this basename: LABPROT, INR,  in the last 72 hours Urine Drug Screen: Drugs of Abuse  No results found for this basename: labopia, cocainscrnur, labbenz, amphetmu, thcu, labbarb    Alcohol Level: No results found for this basename: ETH,  in the last 72 hours Urinalysis: No results found for this basename: COLORURINE, APPERANCEUR, LABSPEC, PHURINE, GLUCOSEU, HGBUR, BILIRUBINUR, KETONESUR, PROTEINUR, UROBILINOGEN, NITRITE, LEUKOCYTESUR,  in the last 72 hours Misc. Labs:  ABGS No results found for this basename: PHART, PCO2, PO2ART, TCO2, HCO3,  in the last 72 hours CULTURES Recent Results (from the past 240 hour(s))  CULTURE, BLOOD (ROUTINE X 2)     Status: None   Collection Time    06/09/13  2:16 AM      Result Value Range Status   Specimen Description BLOOD LEFT ANTECUBITAL   Final   Special Requests     Final   Value: BOTTLES DRAWN AEROBIC AND ANAEROBIC AEB=3CC ANA=2CC   Culture NO GROWTH 3 DAYS   Final   Report Status PENDING   Incomplete  CULTURE, BLOOD (ROUTINE X 2)     Status: None   Collection Time    06/09/13  2:16 AM      Result Value Range Status   Specimen  Description BLOOD LEFT FOREARM   Final   Special Requests BOTTLES DRAWN AEROBIC AND ANAEROBIC 5CC EACH   Final   Culture NO GROWTH 3 DAYS   Final   Report Status PENDING   Incomplete  MRSA PCR SCREENING     Status: Abnormal   Collection Time    06/09/13  3:13 AM      Result Value Range Status   MRSA by PCR POSITIVE (*) NEGATIVE Final   Comment:            The GeneXpert MRSA Assay (FDA     approved for NASAL specimens     only), is one component of a     comprehensive MRSA colonization      surveillance program. It is not     intended to diagnose MRSA     infection nor to guide or     monitor treatment for     MRSA infections.     RESULT CALLED TO, READ BACK BY AND VERIFIED WITH:     HOWELL T AT 0456 ON [id number] BY FORSYTH K  CULTURE, BLOOD (ROUTINE X 2)     Status: None   Collection Time    06/09/13 11:49 PM      Result Value Range Status   Specimen Description BLOOD RIGHT ANTECUBITAL   Final   Special Requests     Final   Value: BOTTLES DRAWN AEROBIC AND ANAEROBIC AEB=6CC ANA=4CC   Culture NO GROWTH 3 DAYS   Final   Report Status PENDING   Incomplete  CULTURE, BLOOD (ROUTINE X 2)     Status: None   Collection Time    06/09/13 11:54 PM      Result Value Range Status   Specimen Description BLOOD RIGHT FOREARM   Final   Special Requests     Final   Value: BOTTLES DRAWN AEROBIC AND ANAEROBIC AEB=4CC ANA=2CC   Culture NO GROWTH 3 DAYS   Final   Report Status PENDING   Incomplete   Studies/Results: No results found.  Medications:  Prior to Admission:  Prescriptions prior to admission  Medication Sig Dispense Refill  . acetaminophen (TYLENOL) 325 MG tablet Take 650 mg by mouth every 4 (four) hours as needed. Pain/fever      . carboxymethylcellulose (REFRESH PLUS) 0.5 % SOLN Place 1 drop into the right eye every 6 (six) hours as needed. Dry eyes      . ENSURE PLUS (ENSURE PLUS) LIQD Take 237 mLs by mouth 2 (two) times daily between meals.      . fentaNYL (DURAGESIC - DOSED MCG/HR) 50 MCG/HR Place 50 mcg onto the skin every other day.      . furosemide (LASIX) 20 MG tablet Take 20 mg by mouth daily.      Marland Kitchen HYDROcodone-acetaminophen (NORCO/VICODIN) 5-325 MG per tablet Take 1 tablet by mouth every 6 (six) hours as needed. pain      . HYDROcodone-acetaminophen (NORCO/VICODIN) 5-325 MG per tablet Take 1 tablet by mouth 3 (three) times daily.      . iron polysaccharides (NIFEREX) 150 MG capsule Take 150 mg by mouth daily.      Marland Kitchen levalbuterol (XOPENEX) 0.63 MG/3ML nebulizer  solution Take 0.63 mg by nebulization every 4 (four) hours as needed for wheezing or shortness of breath.      . levothyroxine (SYNTHROID, LEVOTHROID) 175 MCG tablet Take 175 mcg by mouth daily.      . AZO-CRANBERRY PO Take 1 tablet by mouth 2 (two) times  daily.      . calcium-vitamin D (OSCAL WITH D) 500-200 MG-UNIT per tablet Take 1 tablet by mouth 3 (three) times daily.      Marland Kitchen. docusate sodium (COLACE) 100 MG capsule Take 100 mg by mouth 2 (two) times daily.      Marland Kitchen. escitalopram (LEXAPRO) 10 MG tablet Take 10 mg by mouth daily.      Marland Kitchen. levETIRAcetam (KEPPRA) 250 MG tablet Take 250 mg by mouth every 12 (twelve) hours.      Marland Kitchen. loratadine (CLARITIN) 10 MG tablet Take 10 mg by mouth daily.      . metoprolol tartrate (LOPRESSOR) 25 MG tablet Take 25 mg by mouth daily.      . Multiple Vitamin (MULTIVITAMIN WITH MINERALS) TABS Take 1 tablet by mouth daily.      . nitrofurantoin (MACRODANTIN) 50 MG capsule Take 50 mg by mouth at bedtime.      Marland Kitchen. omeprazole (PRILOSEC) 20 MG capsule Take 20 mg by mouth at bedtime.      Marland Kitchen. rOPINIRole (REQUIP) 4 MG tablet Take 4 mg by mouth at bedtime.      . sennosides-docusate sodium (SENOKOT-S) 8.6-50 MG tablet Take 1 tablet by mouth 2 (two) times daily.      Marland Kitchen. zolpidem (AMBIEN) 5 MG tablet Take 5 mg by mouth at bedtime.       Scheduled: . acetaminophen  650 mg Oral Once  . calcium-vitamin D  1 tablet Oral TID WC  . ceFEPime (MAXIPIME) IV  1 g Intravenous Q24H  . Chlorhexidine Gluconate Cloth  6 each Topical Q0600  . docusate sodium  100 mg Oral BID  . escitalopram  10 mg Oral Daily  . fentaNYL  50 mcg Transdermal Q72H  . levETIRAcetam  250 mg Oral Q12H  . levothyroxine  175 mcg Oral QAC breakfast  . loratadine  10 mg Oral Daily  . metoprolol tartrate  25 mg Oral Daily  . multivitamin with minerals  1 tablet Oral Daily  . mupirocin ointment  1 application Nasal BID  . nitrofurantoin  50 mg Oral QHS  . oseltamivir  30 mg Oral Daily  . pantoprazole  40 mg Oral  Daily  . rOPINIRole  4 mg Oral QHS  . vancomycin  500 mg Intravenous Q24H  . zolpidem  5 mg Oral QHS   Continuous: . sodium chloride 100 mL/hr at 06/11/13 2218   ZOX:[id number]:acetaminophen, HYDROcodone-acetaminophen, ondansetron (ZOFRAN) IV, ondansetron  Assesment: She has healthcare associated pneumonia. She is much improved today and her chest is clear. She has seizure disorder which I think is about the same. She has severe osteoporosis Principal Problem:   HCAP (healthcare-associated pneumonia) Active Problems:   Hospital acquired PNA    Plan: For PICC line continue his other treatments she'll finish her IV antibiotics at her skilled care facility    LOS: 4 days   [name] 06/12/2013, 8:53 AM

## 2013-06-13 NOTE — Progress Notes (Signed)
Subjective: She says she's feeling better. She is still coughing. She has been able to cough up some sputum. Overall she is improved  Objective: Vital signs in last 24 hours: Temp:  [98.1 F (36.7 C)] 98.1 F (36.7 C) (01/25 0459) Pulse Rate:  [72-79] 79 (01/25 0459) Resp:  [20] 20 (01/25 0459) BP: (147-164)/(64-72) 164/72 mmHg (01/25 0856) SpO2:  [97 %-99 %] 99 % (01/25 0459) Weight change:  Last BM Date: 06/12/13  Intake/Output from previous day: 01/24 0701 - 01/25 0700 In: 3350 [P.O.:600; I.V.:2400; IV Piggyback:350] Out: -   PHYSICAL EXAM General appearance: alert, cooperative and mild distress Resp: clear to auscultation bilaterally Cardio: regular rate and rhythm, S1, S2 normal, no murmur, click, rub or gallop GI: soft, non-tender; bowel sounds normal; no masses,  no organomegaly Extremities: extremities normal, atraumatic, no cyanosis or edema  Lab Results:    Basic Metabolic Panel: No results found for this basename: NA, K, CL, CO2, GLUCOSE, BUN, CREATININE, CALCIUM, MG, PHOS,  in the last 72 hours Liver Function Tests: No results found for this basename: AST, ALT, ALKPHOS, BILITOT, PROT, ALBUMIN,  in the last 72 hours No results found for this basename: LIPASE, AMYLASE,  in the last 72 hours No results found for this basename: AMMONIA,  in the last 72 hours CBC: No results found for this basename: WBC, NEUTROABS, HGB, HCT, MCV, PLT,  in the last 72 hours Cardiac Enzymes: No results found for this basename: CKTOTAL, CKMB, CKMBINDEX, TROPONINI,  in the last 72 hours BNP: No results found for this basename: PROBNP,  in the last 72 hours D-Dimer: No results found for this basename: DDIMER,  in the last 72 hours CBG: No results found for this basename: GLUCAP,  in the last 72 hours Hemoglobin A1C: No results found for this basename: HGBA1C,  in the last 72 hours Fasting Lipid Panel: No results found for this basename: CHOL, HDL, LDLCALC, TRIG, CHOLHDL, LDLDIRECT,   in the last 72 hours Thyroid Function Tests: No results found for this basename: TSH, T4TOTAL, FREET4, T3FREE, THYROIDAB,  in the last 72 hours Anemia Panel: No results found for this basename: VITAMINB12, FOLATE, FERRITIN, TIBC, IRON, RETICCTPCT,  in the last 72 hours Coagulation: No results found for this basename: LABPROT, INR,  in the last 72 hours Urine Drug Screen: Drugs of Abuse  No results found for this basename: labopia, cocainscrnur, labbenz, amphetmu, thcu, labbarb    Alcohol Level: No results found for this basename: ETH,  in the last 72 hours Urinalysis: No results found for this basename: COLORURINE, APPERANCEUR, LABSPEC, PHURINE, GLUCOSEU, HGBUR, BILIRUBINUR, KETONESUR, PROTEINUR, UROBILINOGEN, NITRITE, LEUKOCYTESUR,  in the last 72 hours Misc. Labs:  ABGS No results found for this basename: PHART, PCO2, PO2ART, TCO2, HCO3,  in the last 72 hours CULTURES Recent Results (from the past 240 hour(s))  CULTURE, BLOOD (ROUTINE X 2)     Status: None   Collection Time    06/09/13  2:16 AM      Result Value Range Status   Specimen Description BLOOD LEFT ANTECUBITAL   Final   Special Requests     Final   Value: BOTTLES DRAWN AEROBIC AND ANAEROBIC AEB=3CC ANA=2CC   Culture NO GROWTH 4 DAYS   Final   Report Status PENDING   Incomplete  CULTURE, BLOOD (ROUTINE X 2)     Status: None   Collection Time    06/09/13  2:16 AM      Result Value Range Status   Specimen  Description BLOOD LEFT FOREARM   Final   Special Requests BOTTLES DRAWN AEROBIC AND ANAEROBIC 5CC EACH   Final   Culture NO GROWTH 4 DAYS   Final   Report Status PENDING   Incomplete  MRSA PCR SCREENING     Status: Abnormal   Collection Time    06/09/13  3:13 AM      Result Value Range Status   MRSA by PCR POSITIVE (*) NEGATIVE Final   Comment:            The GeneXpert MRSA Assay (FDA     approved for NASAL specimens     only), is one component of a     comprehensive MRSA colonization     surveillance  program. It is not     intended to diagnose MRSA     infection nor to guide or     monitor treatment for     MRSA infections.     RESULT CALLED TO, READ BACK BY AND VERIFIED WITH:     HOWELL T AT 0456 ON [id number] BY FORSYTH K  CULTURE, BLOOD (ROUTINE X 2)     Status: None   Collection Time    06/09/13 11:49 PM      Result Value Range Status   Specimen Description BLOOD RIGHT ANTECUBITAL   Final   Special Requests     Final   Value: BOTTLES DRAWN AEROBIC AND ANAEROBIC AEB=6CC ANA=4CC   Culture NO GROWTH 4 DAYS   Final   Report Status PENDING   Incomplete  CULTURE, BLOOD (ROUTINE X 2)     Status: None   Collection Time    06/09/13 11:54 PM      Result Value Range Status   Specimen Description BLOOD RIGHT FOREARM   Final   Special Requests     Final   Value: BOTTLES DRAWN AEROBIC AND ANAEROBIC AEB=4CC ANA=2CC   Culture NO GROWTH 4 DAYS   Final   Report Status PENDING   Incomplete   Studies/Results: No results found.  Medications:  Prior to Admission:  Prescriptions prior to admission  Medication Sig Dispense Refill  . acetaminophen (TYLENOL) 325 MG tablet Take 650 mg by mouth every 4 (four) hours as needed. Pain/fever      . carboxymethylcellulose (REFRESH PLUS) 0.5 % SOLN Place 1 drop into the right eye every 6 (six) hours as needed. Dry eyes      . ENSURE PLUS (ENSURE PLUS) LIQD Take 237 mLs by mouth 2 (two) times daily between meals.      . fentaNYL (DURAGESIC - DOSED MCG/HR) 50 MCG/HR Place 50 mcg onto the skin every other day.      . furosemide (LASIX) 20 MG tablet Take 20 mg by mouth daily.      Marland Kitchen HYDROcodone-acetaminophen (NORCO/VICODIN) 5-325 MG per tablet Take 1 tablet by mouth every 6 (six) hours as needed. pain      . HYDROcodone-acetaminophen (NORCO/VICODIN) 5-325 MG per tablet Take 1 tablet by mouth 3 (three) times daily.      . iron polysaccharides (NIFEREX) 150 MG capsule Take 150 mg by mouth daily.      Marland Kitchen levalbuterol (XOPENEX) 0.63 MG/3ML nebulizer solution Take  0.63 mg by nebulization every 4 (four) hours as needed for wheezing or shortness of breath.      . levothyroxine (SYNTHROID, LEVOTHROID) 175 MCG tablet Take 175 mcg by mouth daily.      . AZO-CRANBERRY PO Take 1 tablet by mouth 2 (two) times  daily.      . calcium-vitamin D (OSCAL WITH D) 500-200 MG-UNIT per tablet Take 1 tablet by mouth 3 (three) times daily.      Marland Kitchen docusate sodium (COLACE) 100 MG capsule Take 100 mg by mouth 2 (two) times daily.      Marland Kitchen escitalopram (LEXAPRO) 10 MG tablet Take 10 mg by mouth daily.      Marland Kitchen levETIRAcetam (KEPPRA) 250 MG tablet Take 250 mg by mouth every 12 (twelve) hours.      Marland Kitchen loratadine (CLARITIN) 10 MG tablet Take 10 mg by mouth daily.      . metoprolol tartrate (LOPRESSOR) 25 MG tablet Take 25 mg by mouth daily.      . Multiple Vitamin (MULTIVITAMIN WITH MINERALS) TABS Take 1 tablet by mouth daily.      . nitrofurantoin (MACRODANTIN) 50 MG capsule Take 50 mg by mouth at bedtime.      Marland Kitchen omeprazole (PRILOSEC) 20 MG capsule Take 20 mg by mouth at bedtime.      Marland Kitchen rOPINIRole (REQUIP) 4 MG tablet Take 4 mg by mouth at bedtime.      . sennosides-docusate sodium (SENOKOT-S) 8.6-50 MG tablet Take 1 tablet by mouth 2 (two) times daily.      Marland Kitchen zolpidem (AMBIEN) 5 MG tablet Take 5 mg by mouth at bedtime.       Scheduled: . acetaminophen  650 mg Oral Once  . calcium-vitamin D  1 tablet Oral TID WC  . ceFEPime (MAXIPIME) IV  1 g Intravenous Q24H  . Chlorhexidine Gluconate Cloth  6 each Topical Q0600  . docusate sodium  100 mg Oral BID  . escitalopram  10 mg Oral Daily  . fentaNYL  50 mcg Transdermal Q72H  . levETIRAcetam  250 mg Oral Q12H  . levothyroxine  175 mcg Oral QAC breakfast  . loratadine  10 mg Oral Daily  . metoprolol tartrate  25 mg Oral Daily  . multivitamin with minerals  1 tablet Oral Daily  . mupirocin ointment  1 application Nasal BID  . nitrofurantoin  50 mg Oral QHS  . pantoprazole  40 mg Oral Daily  . rOPINIRole  4 mg Oral QHS  . vancomycin   500 mg Intravenous Q24H  . zolpidem  5 mg Oral QHS   Continuous: . sodium chloride 100 mL/hr at 06/12/13 2005   ZOX:[id number], guaiFENesin-dextromethorphan, HYDROcodone-acetaminophen, ondansetron (ZOFRAN) IV, ondansetron  Assesment: She is admitted with healthcare associated pneumonia which is improving. Principal Problem:   HCAP (healthcare-associated pneumonia) Active Problems:   Hospital acquired PNA    Plan: She will probably be able to go back to her skilled care facility tomorrow but needs to have a PICC line placed for    LOS: 5 days   Keara Pagliarulo L 06/13/2013, 10:42 AM

## 2013-06-14 ENCOUNTER — Inpatient Hospital Stay (HOSPITAL_COMMUNITY): Payer: Medicare Other

## 2013-06-14 ENCOUNTER — Encounter (HOSPITAL_COMMUNITY): Payer: Medicare Other

## 2013-06-14 ENCOUNTER — Inpatient Hospital Stay
Admission: RE | Admit: 2013-06-14 | Discharge: 2014-08-11 | Disposition: A | Discharge status: Other Acute Inpatient Hospital | Payer: Medicaid Other | Source: Ambulatory Visit | Attending: Pulmonary Disease | Admitting: Pulmonary Disease

## 2013-06-14 DIAGNOSIS — R509 Fever, unspecified: Secondary | ICD-10-CM

## 2013-06-14 DIAGNOSIS — F32A Depression, unspecified: Secondary | ICD-10-CM | POA: Diagnosis present

## 2013-06-14 DIAGNOSIS — R0602 Shortness of breath: Secondary | ICD-10-CM

## 2013-06-14 DIAGNOSIS — L539 Erythematous condition, unspecified: Secondary | ICD-10-CM

## 2013-06-14 DIAGNOSIS — R52 Pain, unspecified: Secondary | ICD-10-CM

## 2013-06-14 DIAGNOSIS — R569 Unspecified convulsions: Secondary | ICD-10-CM

## 2013-06-14 DIAGNOSIS — T148XXA Other injury of unspecified body region, initial encounter: Secondary | ICD-10-CM

## 2013-06-14 DIAGNOSIS — E039 Hypothyroidism, unspecified: Secondary | ICD-10-CM | POA: Diagnosis present

## 2013-06-14 DIAGNOSIS — F329 Major depressive disorder, single episode, unspecified: Secondary | ICD-10-CM | POA: Diagnosis present

## 2013-06-14 DIAGNOSIS — M810 Age-related osteoporosis without current pathological fracture: Secondary | ICD-10-CM | POA: Diagnosis present

## 2013-06-14 DIAGNOSIS — R05 Cough: Secondary | ICD-10-CM

## 2013-06-14 DIAGNOSIS — R600 Localized edema: Principal | ICD-10-CM

## 2013-06-14 DIAGNOSIS — R059 Cough, unspecified: Secondary | ICD-10-CM

## 2013-06-14 LAB — BASIC METABOLIC PANEL
BUN: 9 mg/dL (ref 6–23)
CO2: 32 mEq/L (ref 19–32)
Calcium: 8.2 mg/dL — ABNORMAL LOW (ref 8.4–10.5)
Chloride: 101 mEq/L (ref 96–112)
Creatinine, Ser: 0.81 mg/dL (ref 0.50–1.10)
GFR calc Af Amer: 73 mL/min — ABNORMAL LOW (ref >90)
GFR calc non Af Amer: 63 mL/min — ABNORMAL LOW (ref >90)
GLUCOSE: 93 mg/dL (ref 70–99)
POTASSIUM: 3.7 meq/L (ref 3.7–5.3)
Sodium: 141 mEq/L (ref 137–147)

## 2013-06-14 LAB — CULTURE, BLOOD (ROUTINE X 2)
CULTURE: NO GROWTH
Culture: NO GROWTH
Culture: NO GROWTH
Culture: NO GROWTH

## 2013-06-14 LAB — VANCOMYCIN, TROUGH: Vancomycin Tr: 9.2 ug/mL — ABNORMAL LOW (ref 10.0–20.0)

## 2013-06-14 MED ORDER — SODIUM CHLORIDE 0.9 % IJ SOLN
10.0000 mL | INTRAMUSCULAR | Status: DC | PRN
  Administered 2013-06-14: 20 mL

## 2013-06-14 MED ORDER — SODIUM CHLORIDE 0.9 % IJ SOLN: 10.0000 mL | Freq: Two times a day (BID) | INTRAMUSCULAR | Status: DC

## 2013-06-14 MED ORDER — HEPARIN SOD (PORK) LOCK FLUSH 100 UNIT/ML IV SOLN
250.0000 [IU] | Freq: Every day | INTRAVENOUS | Status: DC
  Administered 2013-06-14: 250 [IU]
  Filled 2013-06-14: qty 5

## 2013-06-14 MED ORDER — HEPARIN SOD (PORK) LOCK FLUSH 100 UNIT/ML IV SOLN: 250.0000 [IU] | INTRAVENOUS | Status: DC | PRN

## 2013-06-14 MED ORDER — VANCOMYCIN HCL 500 MG IV SOLR: 500.0000 mg | INTRAVENOUS | Status: DC

## 2013-06-14 MED ORDER — DEXTROSE 5 % IV SOLN: 1.0000 g | INTRAVENOUS | Status: AC

## 2013-06-14 MED ORDER — VANCOMYCIN HCL IN DEXTROSE 750-5 MG/150ML-% IV SOLN
750.0000 mg | Freq: Once | INTRAVENOUS | Status: DC
Start: 2013-06-14 — End: 2014-03-16

## 2013-06-14 MED ORDER — VANCOMYCIN HCL IN DEXTROSE 750-5 MG/150ML-% IV SOLN
750.0000 mg | Freq: Once | INTRAVENOUS | Status: AC
  Administered 2013-06-14: 750 mg via INTRAVENOUS
  Filled 2013-06-14: qty 150

## 2013-06-14 NOTE — Clinical Social Work Note (Signed)
Patient ready for discharge today, will return [facility] via tunnel w [facility] staff.  [facility].  Discharge packet prepared and placed w shadow chart.  Will transfer after PICC line placement, RN aware and will call report.  [facility] aware and agreeable that patient will require IV antibiotics at discharge, will discharge w PICC line and is on droplet and contact isolation.  Patient agreeable to plan.  CSW signing off as no further SW needs identified  Santa GeneraAnne Laydon Martis, LCSW Clinical Social Worker [phone])

## 2013-06-14 NOTE — Progress Notes (Signed)
Report called to the [facility]. 

## 2013-06-14 NOTE — Progress Notes (Signed)
UR chart review completed.  

## 2013-06-14 NOTE — Discharge Summary (Signed)
Physician Discharge Summary  Patient ID: [name] MRN: [id number] DOB/AGE: Dec 09, 1922 80 y.o. Primary Care Physician:Dorina Ribaudo L, MD Admit date: 06/08/2013 Discharge date: 06/14/2013    Discharge Diagnoses:   Principal Problem:   HCAP (healthcare-associated pneumonia) Active Problems:   Hospital acquired PNA  influenza Osteoporosis Seizure disorder Hypertension Hypothyroidism Chronic pain related to multiple compression fractures Anxiety Depression    Medication List         acetaminophen 325 MG tablet  Commonly known as:  TYLENOL  Take 650 mg by mouth every 4 (four) hours as needed. Pain/fever     AZO-CRANBERRY PO  Take 1 tablet by mouth 2 (two) times daily.     calcium-vitamin D 500-200 MG-UNIT per tablet  Commonly known as:  OSCAL WITH D  Take 1 tablet by mouth 3 (three) times daily.     carboxymethylcellulose 0.5 % Soln  Commonly known as:  REFRESH PLUS  Place 1 drop into the right eye every 6 (six) hours as needed. Dry eyes     dextrose 5 % SOLN 50 mL with ceFEPIme 1 G SOLR 1 g  Inject 1 g into the vein daily.     docusate sodium 100 MG capsule  Commonly known as:  COLACE  Take 100 mg by mouth 2 (two) times daily.     ENSURE PLUS Liqd  Take 237 mLs by mouth 2 (two) times daily between meals.     escitalopram 10 MG tablet  Commonly known as:  LEXAPRO  Take 10 mg by mouth daily.     fentaNYL 50 MCG/HR  Commonly known as:  DURAGESIC - dosed mcg/hr  Place 50 mcg onto the skin every other day.     furosemide 20 MG tablet  Commonly known as:  LASIX  Take 20 mg by mouth daily.     HYDROcodone-acetaminophen 5-325 MG per tablet  Commonly known as:  NORCO/VICODIN  Take 1 tablet by mouth every 6 (six) hours as needed. pain     HYDROcodone-acetaminophen 5-325 MG per tablet  Commonly known as:  NORCO/VICODIN  Take 1 tablet by mouth 3 (three) times daily.     iron polysaccharides 150 MG capsule  Commonly known as:  NIFEREX  Take 150 mg by  mouth daily.     levalbuterol 0.63 MG/3ML nebulizer solution  Commonly known as:  XOPENEX  Take 0.63 mg by nebulization every 4 (four) hours as needed for wheezing or shortness of breath.     levETIRAcetam 250 MG tablet  Commonly known as:  KEPPRA  Take 250 mg by mouth every 12 (twelve) hours.     levothyroxine 175 MCG tablet  Commonly known as:  SYNTHROID, LEVOTHROID  Take 175 mcg by mouth daily.     loratadine 10 MG tablet  Commonly known as:  CLARITIN  Take 10 mg by mouth daily.     metoprolol tartrate 25 MG tablet  Commonly known as:  LOPRESSOR  Take 25 mg by mouth daily.     multivitamin with minerals Tabs tablet  Take 1 tablet by mouth daily.     nitrofurantoin 50 MG capsule  Commonly known as:  MACRODANTIN  Take 50 mg by mouth at bedtime.     omeprazole 20 MG capsule  Commonly known as:  PRILOSEC  Take 20 mg by mouth at bedtime.     rOPINIRole 4 MG tablet  Commonly known as:  REQUIP  Take 4 mg by mouth at bedtime.     sennosides-docusate sodium 8.6-50 MG tablet  Commonly known as:  SENOKOT-S  Take 1 tablet by mouth 2 (two) times daily.     sodium chloride 0.9 % SOLN 100 mL with vancomycin 500 MG SOLR 500 mg  Inject 500 mg into the vein daily.     zolpidem 5 MG tablet  Commonly known as:  AMBIEN  Take 5 mg by mouth at bedtime.        Discharged Condition: Improved    Consults: None  Significant Diagnostic Studies: Dg Chest 1 View  06/09/2013   CLINICAL DATA:  Status post fall, with back pain.  Cough and fever.  EXAM: CHEST - 1 VIEW  COMPARISON:  Chest radiograph performed 08/13/2012  FINDINGS: Lung expansion is mildly decreased. Right basilar airspace opacity raises concern for pneumonia. No definite pleural effusion or pneumothorax is seen. Pulmonary vascularity is at the upper limits of normal.  The cardiomediastinal silhouette is mildly enlarged. No acute osseous abnormalities are identified. The patient is status post vertebroplasty at multiple  levels along the upper lumbar spine. A large hiatal hernia is suspected.  IMPRESSION: 1. Right basilar airspace opacity raises concern for pneumonia. Lungs mildly hypoexpanded. 2. Mild cardiomegaly. 3. No displaced rib fractures seen. 4. Suspect large hiatal hernia.   Electronically Signed   By: Roanna Raider M.D.   On: 06/09/2013 00:20   Ct Head Wo Contrast  06/09/2013   CLINICAL DATA:  Fall from toilet, asymptomatic.  EXAM: CT HEAD WITHOUT CONTRAST  CT CERVICAL SPINE WITHOUT CONTRAST  TECHNIQUE: Multidetector CT imaging of the head and cervical spine was performed following the standard protocol without intravenous contrast. Multiplanar CT image reconstructions of the cervical spine were also generated.  COMPARISON:  CT of head and cervical spine July 16, 2012  FINDINGS: CT HEAD FINDINGS  The ventricles and sulci are normal for age. No intraparenchymal hemorrhage, mass effect nor midline shift. Patchy supratentorial white matter hypodensities are within normal range for patient's age and though non-specific suggest sequelae of chronic small vessel ischemic disease. No acute large vascular territory infarcts.  No abnormal extra-axial fluid collections. Basal cisterns are patent. Moderate calcific atherosclerosis of the carotid siphons and vertebral arteries.  No skull fracture. Status post left orbital exenteration, and left hemi maxillectomy with prosthesis. Minimal fluid density in right mastoid tip. Right ocular lens implant.  CT CERVICAL SPINE FINDINGS  Accentuated cervical lordosis. Stable grade 1 C6-7 anterolisthesis without spondylolysis. Cervical vertebral bodies are intact. Similar degenerative disc disease. Decreased bone mineral density without destructive bony lesions. 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 C1-2 articulation maintained with moderate arthropathy. Osteopenia without destructive bony lesions. Mild calcific atherosclerosis of the  carotid bulbs.  IMPRESSION: CT head: No acute intracranial process. Involutional changes. Moderate white matter changes suggest chronic small vessel ischemic disease.  Status post left orbital exenteration and left hemi maxillectomy.  CT cervical spine: No acute fracture. Stable grade 1 C6-7 anterolisthesis without spondylolysis. Accentuated cervical lordosis.   Electronically Signed   By: Awilda Metro   On: 06/09/2013 00:47   Ct Cervical Spine Wo Contrast  06/09/2013   CLINICAL DATA:  Fall from toilet, asymptomatic.  EXAM: CT HEAD WITHOUT CONTRAST  CT CERVICAL SPINE WITHOUT CONTRAST  TECHNIQUE: Multidetector CT imaging of the head and cervical spine was performed following the standard protocol without intravenous contrast. Multiplanar CT image reconstructions of the cervical spine were also generated.  COMPARISON:  CT of head and cervical spine July 16, 2012  FINDINGS: CT HEAD FINDINGS  The ventricles and sulci are normal for age. No intraparenchymal hemorrhage, mass effect nor midline shift. Patchy supratentorial white matter hypodensities are within normal range for patient's age and though non-specific suggest sequelae of chronic small vessel ischemic disease. No acute large vascular territory infarcts.  No abnormal extra-axial fluid collections. Basal cisterns are patent. Moderate calcific atherosclerosis of the carotid siphons and vertebral arteries.  No skull fracture. Status post left orbital exenteration, and left hemi maxillectomy with prosthesis. Minimal fluid density in right mastoid tip. Right ocular lens implant.  CT CERVICAL SPINE FINDINGS  Accentuated cervical lordosis. Stable grade 1 C6-7 anterolisthesis without spondylolysis. Cervical vertebral bodies are intact. Similar degenerative disc disease. Decreased bone mineral density without destructive bony lesions. 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 C1-2 articulation maintained with  moderate arthropathy. Osteopenia without destructive bony lesions. Mild calcific atherosclerosis of the carotid bulbs.  IMPRESSION: CT head: No acute intracranial process. Involutional changes. Moderate white matter changes suggest chronic small vessel ischemic disease.  Status post left orbital exenteration and left hemi maxillectomy.  CT cervical spine: No acute fracture. Stable grade 1 C6-7 anterolisthesis without spondylolysis. Accentuated cervical lordosis.   Electronically Signed   By: Awilda Metro   On: 06/09/2013 00:47    Lab Results: Basic Metabolic Panel:  Recent Labs  16/10/96 0504  NA 141  K 3.7  CL 101  CO2 32  GLUCOSE 93  BUN 9  CREATININE 0.81  CALCIUM 8.2*   Liver Function Tests: No results found for this basename: AST, ALT, ALKPHOS, BILITOT, PROT, ALBUMIN,  in the last 72 hours   CBC: No results found for this basename: WBC, NEUTROABS, HGB, HCT, MCV, PLT,  in the last 72 hours  Recent Results (from the past 240 hour(s))  CULTURE, BLOOD (ROUTINE X 2)     Status: None   Collection Time    06/09/13  2:16 AM      Result Value Range Status   Specimen Description BLOOD LEFT ANTECUBITAL   Final   Special Requests     Final   Value: BOTTLES DRAWN AEROBIC AND ANAEROBIC AEB=3CC ANA=2CC   Culture NO GROWTH 4 DAYS   Final   Report Status PENDING   Incomplete  CULTURE, BLOOD (ROUTINE X 2)     Status: None   Collection Time    06/09/13  2:16 AM      Result Value Range Status   Specimen Description BLOOD LEFT FOREARM   Final   Special Requests BOTTLES DRAWN AEROBIC AND ANAEROBIC 5CC EACH   Final   Culture NO GROWTH 4 DAYS   Final   Report Status PENDING   Incomplete  MRSA PCR SCREENING     Status: Abnormal   Collection Time    06/09/13  3:13 AM      Result Value Range Status   MRSA by PCR POSITIVE (*) NEGATIVE Final   Comment:            The GeneXpert MRSA Assay (FDA     approved for NASAL specimens     only), is one component of a     comprehensive MRSA  colonization     surveillance program. It is not     intended to diagnose MRSA     infection nor to guide or     monitor treatment for     MRSA infections.     RESULT CALLED TO, READ BACK BY AND VERIFIED WITH:  HOWELL T AT 0456 ON [id number] BY FORSYTH K  CULTURE, BLOOD (ROUTINE X 2)     Status: None   Collection Time    06/09/13 11:49 PM      Result Value Range Status   Specimen Description BLOOD RIGHT ANTECUBITAL   Final   Special Requests     Final   Value: BOTTLES DRAWN AEROBIC AND ANAEROBIC AEB=6CC ANA=4CC   Culture NO GROWTH 4 DAYS   Final   Report Status PENDING   Incomplete  CULTURE, BLOOD (ROUTINE X 2)     Status: None   Collection Time    06/09/13 11:54 PM      Result Value Range Status   Specimen Description BLOOD RIGHT FOREARM   Final   Special Requests     Final   Value: BOTTLES DRAWN AEROBIC AND ANAEROBIC AEB=4CC ANA=2CC   Culture NO GROWTH 4 DAYS   Final   Report Status PENDING   Incomplete     Hospital Course: She came to the emergency department because she had slipped out of her chair at the nursing home. When she was sent for evaluation in the emergency department she was found to have a temperature of 102 and further evaluation was then undertaken. She was positive for influenza A. by PCR she had pneumonia on chest x-ray. She was started on cefepime and vancomycin. She improved over the next several days it was felt that she would need 10 days of IV antibiotics. She is going to have a PICC line placed today and then be transferred back to the skilled care facility  Discharge Exam: Blood pressure 158/76, pulse 76, temperature 98.6 F (37 C), temperature source Oral, resp. rate 20, height 4\' 10"  (1.473 m), weight 44.9 kg (98 lb 15.8 oz), SpO2 96.00%. She is awake and alert. Her chest is much clearer. Her heart is regular.  Disposition: To skilled care facility. She will have cefepime and vancomycin and will finish after her dose of 06/18/2013      Discharge  Orders   Future Appointments Provider Department Dept Phone   06/14/2013 9:30 AM [facility] [phone]   Future Orders Complete By Expires   Discharge to SNF when bed available  As directed         Signed: Raynee Mccasland L Pager [phone]  06/14/2013, 9:00 AM

## 2013-06-15 NOTE — Progress Notes (Signed)
NAMMarland Kitchen:  [name], [name]               ACCOUNT NO.:  [id number]  MEDICAL RECORD NO.:  [id number]  LOCATION:  S126                          FACILITY:  [facility]  PHYSICIAN:  Raphel Stickles L. Juanetta GoslingHawkins, M.D.DATE OF BIRTH:  09/24/1922  DATE OF PROCEDURE: DATE OF DISCHARGE:                                PROGRESS NOTE   HISTORY OF PRESENT ILLNESS:  [name] was admitted with healthcare- associated pneumonia.  She has other medical problems including severe osteoporosis, chronic pain related to multiple fractures, hypothyroidism.  She is doing much better.  PHYSICAL EXAMINATION:  VITAL SIGNS:  Her temperature is 98.6, pulse 76, respirations 20, blood pressure 158/76, and O2 sats 96%. CHEST:  Much clearer.  ASSESSMENT:  She is improving.  PLAN:  She is going to have a PICC line placed today and then she can be transferred back to her skilled care facility.     Thurman Sarver L. Juanetta GoslingHawkins, M.D.     EL[facility]/MEDQ  D:  06/14/2013  T:  06/15/2013  Job:  [id number]

## 2013-06-24 ENCOUNTER — Ambulatory Visit (HOSPITAL_COMMUNITY)
Admit: 2013-06-24 | Discharge: 2013-06-24 | Disposition: A | Discharge status: Home / Self Care | Payer: Medicare Other | Attending: Pulmonary Disease | Admitting: Pulmonary Disease

## 2013-06-24 DIAGNOSIS — M7989 Other specified soft tissue disorders: Secondary | ICD-10-CM | POA: Insufficient documentation

## 2013-06-24 DIAGNOSIS — M79609 Pain in unspecified limb: Secondary | ICD-10-CM | POA: Insufficient documentation

## 2013-06-27 NOTE — H&P (Signed)
CARAL WHAN MRN: [id number] DOB/AGE: 08-08-1922 80 y.o. Primary Care Physician:Itzelle Gains L, MD Admit date: 06/14/2013 Chief Complaint: Pneumonia HPI: This is an 80 year old who has been a resident of the skilled care facility for approximately 10 years. She had developed healthcare associated pneumonia associated with influenza and was hospitalized for that. She finished her antibiotics at the skilled care facility and has done well since then. She denies any cough or congestion but she is still weak. No seizures.  Past Medical History  Diagnosis Date  . Seizures   . Aspiration pneumonia   . Dysphagia   . Osteoporosis   . Thyroid disease   . Depression   . MRSA (methicillin resistant staph aureus) culture positive   . Respiratory failure   . Hypokalemia   . Reflux   . Vertebral compression fracture     thoracic and lumbar  . History of sinus cancer 1970's    left side  . Chronic pain    Past Surgical History  Procedure Laterality Date  . Kyphoplasty      Lumbar and thoracic  . Palate surgery  1970's    removal of hard and soft palate on left side  . Enucleation Left 1970's    left eye  . Cholecystectomy    . Appendectomy    . Cesarean section          No family history on file.  Social History:  reports that she has never smoked. She does not have any smokeless tobacco history on file. She reports that she does not drink alcohol or use illicit drugs.   Allergies:  Allergies  Allergen Reactions  . Aricept [Donepezil Hcl]   . Bactrim [Sulfamethoxazole-Trimethoprim]   . Ciprofloxacin   . Darvocet [Propoxyphene N-Acetaminophen]   . Namenda [Memantine Hcl]   . Penicillins   . Vibramycin [Doxycycline Calcium]     Medications Prior to Admission  Medication Sig Dispense Refill  . acetaminophen (TYLENOL) 325 MG tablet Take 650 mg by mouth every 4 (four) hours as needed. Pain/fever      . AZO-CRANBERRY PO Take 1 tablet by mouth 2 (two) times daily.      .  calcium-vitamin D (OSCAL WITH D) 500-200 MG-UNIT per tablet Take 1 tablet by mouth 3 (three) times daily.      . carboxymethylcellulose (REFRESH PLUS) 0.5 % SOLN Place 1 drop into the right eye every 6 (six) hours as needed. Dry eyes      . [EXPIRED] dextrose 5 % SOLN 50 mL with ceFEPIme 1 G SOLR 1 g Inject 1 g into the vein daily.      Marland Kitchen docusate sodium (COLACE) 100 MG capsule Take 100 mg by mouth 2 (two) times daily.      Marland Kitchen ENSURE PLUS (ENSURE PLUS) LIQD Take 237 mLs by mouth 2 (two) times daily between meals.      Marland Kitchen escitalopram (LEXAPRO) 10 MG tablet Take 10 mg by mouth daily.      . fentaNYL (DURAGESIC - DOSED MCG/HR) 50 MCG/HR Place 50 mcg onto the skin every other day.      . furosemide (LASIX) 20 MG tablet Take 20 mg by mouth daily.      Marland Kitchen HYDROcodone-acetaminophen (NORCO/VICODIN) 5-325 MG per tablet Take 1 tablet by mouth every 6 (six) hours as needed. pain      . HYDROcodone-acetaminophen (NORCO/VICODIN) 5-325 MG per tablet Take 1 tablet by mouth 3 (three) times daily.      . iron polysaccharides (NIFEREX) 150  MG capsule Take 150 mg by mouth daily.      Marland Kitchen levalbuterol (XOPENEX) 0.63 MG/3ML nebulizer solution Take 0.63 mg by nebulization every 4 (four) hours as needed for wheezing or shortness of breath.      . levETIRAcetam (KEPPRA) 250 MG tablet Take 250 mg by mouth every 12 (twelve) hours.      Marland Kitchen levothyroxine (SYNTHROID, LEVOTHROID) 175 MCG tablet Take 175 mcg by mouth daily.      Marland Kitchen loratadine (CLARITIN) 10 MG tablet Take 10 mg by mouth daily.      . metoprolol tartrate (LOPRESSOR) 25 MG tablet Take 25 mg by mouth daily.      . Multiple Vitamin (MULTIVITAMIN WITH MINERALS) TABS Take 1 tablet by mouth daily.      . nitrofurantoin (MACRODANTIN) 50 MG capsule Take 50 mg by mouth at bedtime.      Marland Kitchen omeprazole (PRILOSEC) 20 MG capsule Take 20 mg by mouth at bedtime.      Marland Kitchen rOPINIRole (REQUIP) 4 MG tablet Take 4 mg by mouth at bedtime.      . sennosides-docusate sodium (SENOKOT-S) 8.6-50  MG tablet Take 1 tablet by mouth 2 (two) times daily.      . Vancomycin (VANCOCIN) 750 MG/150ML SOLN Inject 150 mLs (750 mg total) into the vein once.      Marland Kitchen zolpidem (AMBIEN) 5 MG tablet Take 5 mg by mouth at bedtime.           ZOX:WRUEA from the symptoms mentioned above,there are no other symptoms referable to all systems reviewed.  Physical Exam: There were no vitals taken for this visit. She is awake and alert and in no distress. She is sitting in a wheelchair. 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 Her mucous membranes are moist. Her neck is supple. Her chest is clear without any wheezes or rhonchi. Her heart is regular without murmur gallop or rub. Her abdomen is soft. She has trace edema of the lower extremities which is chronic. Her central nervous system examination is grossly intact   No results found for this basename: WBC, NEUTROABS, HGB, HCT, MCV, PLT,  in the last 72 hours No results found for this basename: NA, K, CL, CO2, GLUCOSE, BUN, CREATININE, CALCIUM, MG, PHO,  in the last 72 hourslablast2(ast:2,ALT:2,alkphos:2,bilitot:2,prot:2,albumin:2)@    No results found for this or any previous visit (from the past 240 hour(s)).   Dg Chest 1 View  06/09/2013   CLINICAL DATA:  Status post fall, with back pain.  Cough and fever.  EXAM: CHEST - 1 VIEW  COMPARISON:  Chest radiograph performed 08/13/2012  FINDINGS: Lung expansion is mildly decreased. Right basilar airspace opacity raises concern for pneumonia. No definite pleural effusion or pneumothorax is seen. Pulmonary vascularity is at the upper limits of normal.  The cardiomediastinal silhouette is mildly enlarged. No acute osseous abnormalities are identified. The patient is status post vertebroplasty at multiple levels along the upper lumbar spine. A large hiatal hernia is suspected.  IMPRESSION: 1. Right basilar airspace opacity raises concern for pneumonia. Lungs mildly hypoexpanded. 2. Mild cardiomegaly. 3. No displaced  rib fractures seen. 4. Suspect large hiatal hernia.   Electronically Signed   By: Roanna Raider M.D.   On: 06/09/2013 00:20   Ct Head Wo Contrast  06/09/2013   CLINICAL DATA:  Fall from toilet, asymptomatic.  EXAM: CT HEAD WITHOUT CONTRAST  CT CERVICAL SPINE WITHOUT CONTRAST  TECHNIQUE: Multidetector CT imaging of the head and cervical spine was performed  following the standard protocol without intravenous contrast. Multiplanar CT image reconstructions of the cervical spine were also generated.  COMPARISON:  CT of head and cervical spine July 16, 2012  FINDINGS: CT HEAD FINDINGS  The ventricles and sulci are normal for age. No intraparenchymal hemorrhage, mass effect nor midline shift. Patchy supratentorial white matter hypodensities are within normal range for patient's age and though non-specific suggest sequelae of chronic small vessel ischemic disease. No acute large vascular territory infarcts.  No abnormal extra-axial fluid collections. Basal cisterns are patent. Moderate calcific atherosclerosis of the carotid siphons and vertebral arteries.  No skull fracture. Status post left orbital exenteration, and left hemi maxillectomy with prosthesis. Minimal fluid density in right mastoid tip. Right ocular lens implant.  CT CERVICAL SPINE FINDINGS  Accentuated cervical lordosis. Stable grade 1 C6-7 anterolisthesis without spondylolysis. Cervical vertebral bodies are intact. Similar degenerative disc disease. Decreased bone mineral density without destructive bony lesions. 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 C1-2 articulation maintained with moderate arthropathy. Osteopenia without destructive bony lesions. Mild calcific atherosclerosis of the carotid bulbs.  IMPRESSION: CT head: No acute intracranial process. Involutional changes. Moderate white matter changes suggest chronic small vessel ischemic disease.  Status post left orbital exenteration and left hemi  maxillectomy.  CT cervical spine: No acute fracture. Stable grade 1 C6-7 anterolisthesis without spondylolysis. Accentuated cervical lordosis.   Electronically Signed   By: Awilda Metro   On: 06/09/2013 00:47   Ct Cervical Spine Wo Contrast  06/09/2013   CLINICAL DATA:  Fall from toilet, asymptomatic.  EXAM: CT HEAD WITHOUT CONTRAST  CT CERVICAL SPINE WITHOUT CONTRAST  TECHNIQUE: Multidetector CT imaging of the head and cervical spine was performed following the standard protocol without intravenous contrast. Multiplanar CT image reconstructions of the cervical spine were also generated.  COMPARISON:  CT of head and cervical spine July 16, 2012  FINDINGS: CT HEAD FINDINGS  The ventricles and sulci are normal for age. No intraparenchymal hemorrhage, mass effect nor midline shift. Patchy supratentorial white matter hypodensities are within normal range for patient's age and though non-specific suggest sequelae of chronic small vessel ischemic disease. No acute large vascular territory infarcts.  No abnormal extra-axial fluid collections. Basal cisterns are patent. Moderate calcific atherosclerosis of the carotid siphons and vertebral arteries.  No skull fracture. Status post left orbital exenteration, and left hemi maxillectomy with prosthesis. Minimal fluid density in right mastoid tip. Right ocular lens implant.  CT CERVICAL SPINE FINDINGS  Accentuated cervical lordosis. Stable grade 1 C6-7 anterolisthesis without spondylolysis. Cervical vertebral bodies are intact. Similar degenerative disc disease. Decreased bone mineral density without destructive bony lesions. 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 C1-2 articulation maintained with moderate arthropathy. Osteopenia without destructive bony lesions. Mild calcific atherosclerosis of the carotid bulbs.  IMPRESSION: CT head: No acute intracranial process. Involutional changes. Moderate white matter changes  suggest chronic small vessel ischemic disease.  Status post left orbital exenteration and left hemi maxillectomy.  CT cervical spine: No acute fracture. Stable grade 1 C6-7 anterolisthesis without spondylolysis. Accentuated cervical lordosis.   Electronically Signed   By: Awilda Metro   On: 06/09/2013 00:47   US Venous Img Lower Unilateral Right  06/24/2013   CLINICAL DATA:  Right lower extremity pain and swelling, particularly in the region of the knee.  EXAM: RIGHT LOWER EXTREMITY VENOUS DOPPLER ULTRASOUND  TECHNIQUE: Gray-scale sonography with graded compression, as well as color Doppler and duplex ultrasound,  were performed to evaluate the deep venous system from the level of the common femoral vein through the popliteal and proximal calf veins. Spectral Doppler was utilized to evaluate flow at rest and with distal augmentation maneuvers.  COMPARISON:  11/06/2012.  FINDINGS: Thrombus within deep veins:  None visualized.  Compressibility of deep veins:  Normal.  Duplex waveform respiratory phasicity:  Normal.  Duplex waveform response to augmentation:  Normal.  Venous reflux:  Not evaluated.  Other findings:  None visualized.  IMPRESSION: No evidence of right lower extremity DVT.   Electronically Signed   By: Hulan Saashomas  Lawrence M.D.   On: 06/24/2013 12:19   Dg Chest Port 1 View  06/14/2013   CLINICAL DATA:  Confirm line placement  EXAM: PORTABLE CHEST - 1 VIEW  COMPARISON:  05/31/2013  FINDINGS: Left arm PICC line tip is identified. The tip is within the projection of the cavoatrial junction. The heart size and mediastinal contours are within normal limits. Small bilateral pleural effusions and interstitial edema identified. There is platelike atelectasis in the lung bases. The kyphosis deformity is again identified.  IMPRESSION: 1. Tip of PICC line is in the projection of the cavoatrial junction. 2. Suspect CHF.   Electronically Signed   By: Signa Kellaylor  Stroud M.D.   On: 06/14/2013 14:11   Impression: She  had healthcare associated pneumonia which has improved. She had influenza which has resolved. She has severe osteoporosis and has had multiple fractures. 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 She has seizure disorder but no seizures in the last approximately one year Active Problems:   * No active hospital problems. *     Plan: I think her PICC line can be removed now. No other changes.      Khoa Opdahl L   06/27/2013, 8:58 AM

## 2013-07-19 NOTE — Progress Notes (Signed)
Says she feels okay. She has no new complaints. Her breathing is doing okay. She is not coughing. She has not had any seizures.  She is awake and alert. Her chest is clear. Her heart is regular.  She has seizure disorder which is stable. She had aspiration pneumonia and has recovered from that. She has hypothyroidism doing okay and she has a history of sinus cancer on the left which has left her with an enucleation of her left eye  She's doing well and I not going to change anything

## 2013-07-27 ENCOUNTER — Encounter: Payer: Self-pay | Admitting: *Deleted

## 2013-08-30 NOTE — Progress Notes (Signed)
This is documentation of my visit for 08/29/2013. She apparently fell last night without any obvious cause. She has been checked for urinary tract infection and this is not present based on her urinalysis. In the past she has had multiple urinary tract infections and has had some difficulty with treatment because of her multiple medication intolerances. She has not had any seizures.  She is awake and alert. Chest is clear. Heart is regular. Neurologic she is she is unchanged  She had a fall but no injury. She has seizures but this did not appear to be a seizure. She has severe osteoporosis which is been treated and improved. She has history of sinus cancer which is resulting in an enucleation of her left eye. She's had episodes of pneumonia and respiratory failure for which she has recovered  No change in treatments.

## 2013-09-27 NOTE — Progress Notes (Signed)
This is documentation of my visit 4 09/26/2013 at the skilled care facility. I saw her at approximately 11:20 AM. She was mildly confused. She was a little short of breath after being in the bathroom. No other new complaints have been noted  She is awake and alert but confused. She's had an enucleation of her left eye. Her chest is clear  She has seizure disorder but no seizures recently. She's had episodes of aspiration pneumonia. She has severe osteoporosis with multiple vertebral compression fractures. She's had trouble with anxiety and depression. She has had anemia  No change in treatments she is stable at this time

## 2013-10-30 NOTE — Progress Notes (Signed)
She says she feels well. She has been a resident of the skilled care facility for approximately 10 years. She has had bouts of pneumonia urinary tract infections and seizures but has not had problems with any of those recently. She has severe osteoporosis with multiple fractures but none recently  She is awake and alert. She looks comfortable. Her chest is clear. She has had an enucleation of her left eye because of a cancer in the sinus she is in a wheelchair  She is overall doing quite well now. She has multiple medical problems but is stable  No change in treatments

## 2013-11-29 NOTE — Progress Notes (Signed)
This is documentation of visit: 11/28/2013 at the skilled care facility. She says she feels okay. She has been placed on when necessary Ambien and says that works okay. She is off Macrodantin for UTI prevention. She has no other new complaints today.  She is awake and alert. She is in a wheelchair. 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 Her chest is clear. Her heart is regular.  She has severe osteoporosis and has had multiple fractures. She's doing well with that now. She has pretty severe arthritis related to her multiple fractures. She has had recurrent UTI but is doing okay now. She has insomnia which is pretty well controlled. She has seizure disorder but no seizures recently  No change in treatments. She seems to be doing well in general

## 2013-11-30 ENCOUNTER — Ambulatory Visit (HOSPITAL_COMMUNITY): Payer: Medicare Other | Attending: Pulmonary Disease

## 2013-11-30 DIAGNOSIS — R509 Fever, unspecified: Secondary | ICD-10-CM | POA: Diagnosis not present

## 2013-11-30 DIAGNOSIS — K449 Diaphragmatic hernia without obstruction or gangrene: Secondary | ICD-10-CM | POA: Diagnosis not present

## 2013-12-20 NOTE — Progress Notes (Signed)
This is documentation of visit the skilled care facility of 12/19/2013. She says she feels okay. She has no new complaints. Her breathing is okay. No overt episodes of aspiration have been noted. She has not had any recent seizures. No new fractures.  Physical examination shows a she's sitting in a wheelchair and looks comfortable. 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 Her chest is clear. Heart is regular.  She was admitted to the skilled care facility with osteoporosis and multiple compression fractures. She is much improved. She had a cancer in the sinus requiring enucleation of her left eye and that is stable. She has had seizure disorders but nothing recently. She's had episodes of aspiration pneumonia but nothing recently. She's had multiple urinary tract infections but that is stable for now  No change in treatments

## 2014-01-26 ENCOUNTER — Other Ambulatory Visit: Payer: Self-pay | Admitting: *Deleted

## 2014-01-26 MED ORDER — HYDROCODONE-ACETAMINOPHEN 5-325 MG PO TABS: ORAL_TABLET | ORAL | Status: DC

## 2014-01-26 NOTE — Telephone Encounter (Signed)
error 

## 2014-01-30 NOTE — Progress Notes (Signed)
This is for skilled care facility visit on 01/29/2014. She says she's having a lot of trouble with her bladder. She has significant pain. She's been drinking cranberry juice and it is better but not gone. Her urine culture shows Escherichia coli 100,000 colonies but we do not have sensitivities yet. In the past essentially the only antibiotic that she's been able to take for urinary tract infections has been Macrobid so I'm going to go ahead and start her on that pending culture results. Otherwise she says she feels okay. No other new complaints. No seizures.  She has urinary tract infection. She has seizure disorder and previous history of aspiration pneumonia from that. She has osteoporosis and has had multiple vertebral compression fractures. She had history of sinus cancer and has had a left eye enucleation.  Start Macrobid as above. This may need to be altered depending on culture results

## 2014-02-04 ENCOUNTER — Encounter: Payer: Self-pay | Admitting: Pulmonary Disease

## 2014-02-18 ENCOUNTER — Ambulatory Visit (HOSPITAL_COMMUNITY): Payer: Medicare Other | Attending: Internal Medicine

## 2014-02-18 DIAGNOSIS — I7090 Unspecified atherosclerosis: Secondary | ICD-10-CM | POA: Insufficient documentation

## 2014-02-18 DIAGNOSIS — K449 Diaphragmatic hernia without obstruction or gangrene: Secondary | ICD-10-CM | POA: Insufficient documentation

## 2014-02-18 DIAGNOSIS — R05 Cough: Secondary | ICD-10-CM | POA: Insufficient documentation

## 2014-03-16 ENCOUNTER — Encounter: Payer: Self-pay | Admitting: *Deleted

## 2014-03-30 DIAGNOSIS — M810 Age-related osteoporosis without current pathological fracture: Secondary | ICD-10-CM | POA: Diagnosis present

## 2014-03-30 DIAGNOSIS — R569 Unspecified convulsions: Secondary | ICD-10-CM

## 2014-03-30 DIAGNOSIS — F329 Major depressive disorder, single episode, unspecified: Secondary | ICD-10-CM | POA: Diagnosis present

## 2014-03-30 DIAGNOSIS — E039 Hypothyroidism, unspecified: Secondary | ICD-10-CM | POA: Diagnosis present

## 2014-03-30 DIAGNOSIS — F32A Depression, unspecified: Secondary | ICD-10-CM | POA: Diagnosis present

## 2014-03-30 NOTE — Progress Notes (Signed)
This is documentation of the visit of 03/27/2014 in the skilled care facility. She says she feels well. She has no new complaints. Her breathing is okay. She is not having any urinary symptoms now. She has not had any seizures.  She is awake and alert. She is in a wheelchair. She has had enucleation of her left side. Her chest is clear. Heart is regular  She has multiple medical problems but is doing well.  No change in treatment

## 2014-04-04 ENCOUNTER — Ambulatory Visit (HOSPITAL_COMMUNITY): Payer: Medicare Other | Attending: Internal Medicine

## 2014-04-04 DIAGNOSIS — R05 Cough: Secondary | ICD-10-CM | POA: Diagnosis present

## 2014-06-13 NOTE — Progress Notes (Signed)
This is documentation of my progress note for 05/15/2014 which was dictated at that time but I cannot find in the system now. She says she is doing well. She has no new complaints. She is weak but no different. No seizures.  She is awake and alert. She is in a wheelchair. Her chest is clear.  She is very weak. She has severe osteoporosis. She has seizure disorder but nothing recently.  Continue current treatments

## 2014-06-13 NOTE — Progress Notes (Signed)
She has what seems to be a lesion on her right ankle now. No trauma has been noted. She has no other new complaints.  She has a 1 cm ulceration on her right ankle. Her chest is clear. She is in a wheelchair.  I think she is gradually declining. She has an ulceration on her ankle and because of her difficulty taking multiple antibiotics I'm going to try her on Bactroban ointment.

## 2014-06-17 ENCOUNTER — Ambulatory Visit (HOSPITAL_COMMUNITY): Payer: Medicare Other | Attending: Pulmonary Disease

## 2014-06-17 DIAGNOSIS — M25571 Pain in right ankle and joints of right foot: Secondary | ICD-10-CM | POA: Diagnosis not present

## 2014-06-17 DIAGNOSIS — L539 Erythematous condition, unspecified: Secondary | ICD-10-CM | POA: Diagnosis not present

## 2014-07-12 ENCOUNTER — Encounter (HOSPITAL_COMMUNITY)
Admission: RE | Admit: 2014-07-12 | Discharge: 2014-07-12 | Disposition: A | Discharge status: Home / Self Care | Payer: Medicare Other | Source: Skilled Nursing Facility | Attending: Pulmonary Disease | Admitting: Pulmonary Disease

## 2014-07-13 ENCOUNTER — Non-Acute Institutional Stay (SKILLED_NURSING_FACILITY): Payer: Medicare Other | Admitting: Internal Medicine

## 2014-07-13 DIAGNOSIS — S91001A Unspecified open wound, right ankle, initial encounter: Secondary | ICD-10-CM | POA: Diagnosis not present

## 2014-07-13 DIAGNOSIS — L97311 Non-pressure chronic ulcer of right ankle limited to breakdown of skin: Secondary | ICD-10-CM

## 2014-07-13 DIAGNOSIS — L03115 Cellulitis of right lower limb: Secondary | ICD-10-CM

## 2014-07-13 DIAGNOSIS — R937 Abnormal findings on diagnostic imaging of other parts of musculoskeletal system: Secondary | ICD-10-CM | POA: Diagnosis not present

## 2014-07-13 DIAGNOSIS — X58XXXD Exposure to other specified factors, subsequent encounter: Secondary | ICD-10-CM

## 2014-07-13 DIAGNOSIS — S91001D Unspecified open wound, right ankle, subsequent encounter: Secondary | ICD-10-CM

## 2014-07-14 ENCOUNTER — Ambulatory Visit (HOSPITAL_COMMUNITY)
Admit: 2014-07-14 | Discharge: 2014-07-14 | Disposition: A | Discharge status: Home / Self Care | Payer: Medicare Other | Source: Skilled Nursing Facility | Attending: Internal Medicine | Admitting: Internal Medicine

## 2014-07-14 ENCOUNTER — Ambulatory Visit (HOSPITAL_COMMUNITY)
Admit: 2014-07-14 | Discharge: 2014-07-14 | Disposition: A | Discharge status: Home / Self Care | Payer: Medicare Other | Attending: Internal Medicine | Admitting: Internal Medicine

## 2014-07-14 DIAGNOSIS — S91001A Unspecified open wound, right ankle, initial encounter: Secondary | ICD-10-CM | POA: Insufficient documentation

## 2014-07-14 DIAGNOSIS — R937 Abnormal findings on diagnostic imaging of other parts of musculoskeletal system: Secondary | ICD-10-CM | POA: Insufficient documentation

## 2014-07-17 NOTE — Progress Notes (Addendum)
Patient ID: [name], female   DOB: 05-09-1923, 81 y.o.   MRN: [id number]                PROGRESS NOTE  DATE:  07/13/2014              FACILITY: [facility]                         LEVEL OF CARE:   SNF   Acute Visit    CHIEF COMPLAINT:  Review of wound over her right lateral malleolus.    HISTORY OF PRESENT ILLNESS:  I was asked to see this patient by Dr. Juanetta GoslingHawkins.  She is a patient who, I believe, has been in the facility since 2011.     She was recently in hospital from 06/08/2014 through 06/14/2014 with healthcare-acquired pneumonia, possible influenza.     She also has:    Severe osteoporosis.    Seizure disorder.    Hypertension.    Hypothyroidism.    Chronic pain related to multiple spinal compression fractures.    Anxiety with depression.    As I understand things, she developed a small wound over the right lateral malleolus about a month ago that was felt to be secondary to trauma on her wheelchair.  Initially, this developed an overlying scab.  Nothing really seemed to be ominous here.  However, recently this has reopened and become associated with surrounding erythema.  She was started on Levaquin for possible cellulitis.  ABIs were done today and, apparently, she is booked for an MRI tomorrow.    PHYSICAL EXAMINATION:   VASCULAR:   ARTERIAL:  Right leg:  I cannot feel pulses in this patient below the femoral.  Her forefoot is cool and pale.  I do not think there is acute ischemia here, but certainly ongoing PAD is quite likely.  The arterial studies were appropriate.   SKIN:   INSPECTION:  Wound exam:  The area in question is over the right lateral malleolus.  This is a small wound.  However, it has some depth and probably covered with a tan-colored, adherent, fibrous eschar.   There is some degree of surrounding erythema and swelling here.    ASSESSMENT/PLAN:                                           Non-diabetic wound over the right lateral  malleolus.  I think there is  PAD here, as well at least based on bedside assessment.   Whether this was a traumatic wound or was all secondary to arterial insufficiency, I am uncertain.  There is erythema around here, tenderness and swelling, compatible with surrounding cellulitis.  I think the Levaquin is appropriate.  Santyl has already been ordered by the facility.  I think this is appropriate, as well.   I am doubtful at this point that there is underlying osteomyelitis, although I think the MRI will give us definitive answers here.     Studies including ABI's and arterial dopplers as well as an MRI are ordered and are appropriate. Santyl is indicated. Mechanical debridement likely to be necessary

## 2014-07-17 NOTE — Progress Notes (Signed)
Subjective: She was seen in the skilled care facility. She is about the same. She still has problems with a ulcer on her right ankle. MRI showed possible osteomyelitis. She is getting better with current treatment however.  Objective: Vital signs in last 24 hours:   Weight change:     Intake/Output from previous day:    PHYSICAL EXAM General appearance: alert, cooperative and mild distress Resp: clear to auscultation bilaterally Cardio: regular rate and rhythm, S1, S2 normal, no murmur, click, rub or gallop GI: soft, non-tender; bowel sounds normal; no masses,  no organomegaly Extremities: The area of ulceration and cellulitis actually looks somewhat better  Lab Results:  No results found for this or any previous visit (from the past 48 hour(s)).  ABGS No results for input(s): PHART, PO2ART, TCO2, HCO3 in the last 72 hours.  Invalid input(s): PCO2 CULTURES No results found for this or any previous visit (from the past 240 hour(s)). Studies/Results: No results found.  Medications:  Prior to Admission:  Prescriptions prior to admission  Medication Sig Dispense Refill Last Dose  . acetaminophen (TYLENOL) 325 MG tablet Take 650 mg by mouth every 4 (four) hours as needed. Pain/fever   unknown  . alum & mag hydroxide-simeth (MYLANTA) 200-200-20 MG/5ML suspension Take 30 mLs by mouth every 4 (four) hours as needed for indigestion or heartburn.     . AZO-CRANBERRY PO Take 1 tablet by mouth 2 (two) times daily. For prevention of urinary tract infections.   unknown at 800 & 2000  . calcium-vitamin D (OSCAL WITH D) 500-200 MG-UNIT per tablet Take 1 tablet by mouth 3 (three) times daily. For osteoporosis   unknown at 8:00 & 17:00 & 20:00  . carboxymethylcellulose (REFRESH PLUS) 0.5 % SOLN Place 1 drop into the right eye every 6 (six) hours as needed. Dry eyes   unknown  . [EXPIRED] dextrose 5 % SOLN 50 mL with ceFEPIme 1 G SOLR 1 g Inject 1 g into the vein daily.     Marland Kitchen docusate sodium  (COLACE) 100 MG capsule Take 100 mg by mouth 2 (two) times daily. For constipation   unknown  . ENSURE PLUS (ENSURE PLUS) LIQD Take 237 mLs by mouth 2 (two) times daily between meals. Nutritional supplement   unknown at 10:00 & 14:00  . escitalopram (LEXAPRO) 10 MG tablet Take 10 mg by mouth daily. For depression   unknown  . fentaNYL (DURAGESIC - DOSED MCG/HR) 50 MCG/HR Place 50 mcg onto the skin every other day. For pain   unknown  . furosemide (LASIX) 20 MG tablet Take 20 mg by mouth daily. For HTN/ edema   unknown  . HYDROcodone-acetaminophen (NORCO/VICODIN) 5-325 MG per tablet Take 1 tablet by mouth 3 (three) times daily. For pain   unknown at 600 & 12:00 & 21:30  . iron polysaccharides (NIFEREX) 150 MG capsule Take 150 mg by mouth daily.   unknown  . levalbuterol (XOPENEX) 0.63 MG/3ML nebulizer solution Take 0.63 mg by nebulization every 4 (four) hours as needed for wheezing or shortness of breath.   unknown  . levETIRAcetam (KEPPRA) 250 MG tablet Take 250 mg by mouth every 12 (twelve) hours. For seizures   unknown at 800 &2000  . levothyroxine (SYNTHROID, LEVOTHROID) 175 MCG tablet Take 175 mcg by mouth daily. For thyroid therapy   unknown at 6:00  . loratadine (CLARITIN) 10 MG tablet Take 10 mg by mouth daily. For allergies   unknown  . magnesium hydroxide (MILK OF MAGNESIA) 400 MG/5ML suspension  Take 30 mLs by mouth daily as needed for mild constipation.     . metoprolol tartrate (LOPRESSOR) 25 MG tablet Take 25 mg by mouth daily. For HTN   unknown at 800  . Multiple Vitamin (MULTIVITAMIN WITH MINERALS) TABS Take 1 tablet by mouth daily. For nutritional supplement   unknown  . nystatin cream (MYCOSTATIN) Apply 1 application topically 2 (two) times daily. For rash / irritations     . omeprazole (PRILOSEC) 20 MG capsule Take 20 mg by mouth at bedtime. For reflux   unknown at 20:00  . phenylephrine-shark liver oil-mineral oil-petrolatum (PREPARATION H) 0.25-3-14-71.9 % rectal ointment Place 1  application rectally as needed for hemorrhoids.     Marland Kitchen. rOPINIRole (REQUIP) 4 MG tablet Take 4 mg by mouth at bedtime. For parkinson disease   unknown at 20:00  . sennosides-docusate sodium (SENOKOT-S) 8.6-50 MG tablet Take 1 tablet by mouth 2 (two) times daily. For constipation   unknown  . zolpidem (AMBIEN) 5 MG tablet Take 5 mg by mouth at bedtime. For insomnia   unknown   Scheduled: Continuous: PRN:  Assesment: She has an infection in the skin on the right ankle. Potential osteomyelitis. It is improving with oral Levaquin at this time and she is intolerant of multiple antibiotics. She might need to be on vancomycin IV but I'd like to see what we can do with the oral Levaquin and see if we can heal this. My thanks to Dr. Leanord Hawkingobson for his help. She also has severe osteoporosis. She has oxygen requirement probably because of her osteoporosis and multiple compression fractures and subsequent ventilation/perfusion abnormality. She has a history of seizures which is stable. She has depression which is stable. She's had trouble with mild dementia Active Problems:   Convulsions/seizures   Hypothyroidism   Osteoporosis   Depression    Plan: Continue current treatments    LOS: 398 days   Vern Guerette L 07/17/2014, 10:41 AM

## 2014-07-20 ENCOUNTER — Non-Acute Institutional Stay (SKILLED_NURSING_FACILITY): Payer: Medicare Other | Admitting: Internal Medicine

## 2014-07-20 DIAGNOSIS — L97311 Non-pressure chronic ulcer of right ankle limited to breakdown of skin: Secondary | ICD-10-CM | POA: Diagnosis not present

## 2014-07-23 NOTE — Progress Notes (Addendum)
Patient ID: [name], female   DOB: 08-Dec-1922, 81 y.o.   MRN: [id number]                PROGRESS NOTE  DATE:  07/20/2014               FACILITY: [facility]                    LEVEL OF CARE:   SNF   Acute Visit                              CHIEF COMPLAINT:  Review of wound over her right lateral malleolus.    HISTORY OF PRESENT ILLNESS:  This is a patient who has a wound over the right lateral malleolus, felt initially to be secondary to trauma on her wheelchair.  This was initially covered by an eschar.  However, it reopened and became associated with surrounding erythema and cellulitis.  She was put on Levaquin.  Surprisingly, her non-invasive vascular studies did not show any evidence of ischemia.  MRI showed edema in the lateral malleolus.  I do not believe this was definitive for osteomyelitis.  Staff report that the erythema is improved.  They are using Santyl on the wound bed.    PHYSICAL EXAMINATION:   SKIN:   INSPECTION:  Right leg:  The area in question is much the same, perhaps with less erythema and less tenderness.   There is still a tan-colored, adherent eschar.         ASSESSMENT/PLAN:                                   Non-diabetic wound over the right lateral malleolus.  I think we should continue with the Santyl.  There was a culture done.  We will try to get the report here.  Surprisingly, her arterial studies were really quite unremarkable.

## 2014-07-25 NOTE — Progress Notes (Signed)
Subjective: This is documentation of my visit at the skilled care facility on 07/24/2014. She says she feels better. Her ankle wound seems a little better. She has no new complaints.  Objective: Vital signs in last 24 hours:   Weight change:     Intake/Output from previous day:    PHYSICAL EXAM General appearance: alert, cooperative and no distress Resp: clear to auscultation bilaterally Cardio: regular rate and rhythm, S1, S2 normal, no murmur, click, rub or gallop GI: soft, non-tender; bowel sounds normal; no masses,  no organomegaly Extremities: The wound on her right lateral malleolus seems a little better. There is still some surrounding edema and erythema  Lab Results:  No results found for this or any previous visit (from the past 48 hour(s)).  ABGS No results for input(s): PHART, PO2ART, TCO2, HCO3 in the last 72 hours.  Invalid input(s): PCO2 CULTURES No results found for this or any previous visit (from the past 240 hour(s)). Studies/Results: No results found.  Medications:  Prior to Admission:  Prescriptions prior to admission  Medication Sig Dispense Refill Last Dose  . acetaminophen (TYLENOL) 325 MG tablet Take 650 mg by mouth every 4 (four) hours as needed. Pain/fever   unknown  . alum & mag hydroxide-simeth (MYLANTA) 200-200-20 MG/5ML suspension Take 30 mLs by mouth every 4 (four) hours as needed for indigestion or heartburn.     . AZO-CRANBERRY PO Take 1 tablet by mouth 2 (two) times daily. For prevention of urinary tract infections.   unknown at 800 & 2000  . calcium-vitamin D (OSCAL WITH D) 500-200 MG-UNIT per tablet Take 1 tablet by mouth 3 (three) times daily. For osteoporosis   unknown at 8:00 & 17:00 & 20:00  . carboxymethylcellulose (REFRESH PLUS) 0.5 % SOLN Place 1 drop into the right eye every 6 (six) hours as needed. Dry eyes   unknown  . [EXPIRED] dextrose 5 % SOLN 50 mL with ceFEPIme 1 G SOLR 1 g Inject 1 g into the vein daily.     Marland Kitchen docusate  sodium (COLACE) 100 MG capsule Take 100 mg by mouth 2 (two) times daily. For constipation   unknown  . ENSURE PLUS (ENSURE PLUS) LIQD Take 237 mLs by mouth 2 (two) times daily between meals. Nutritional supplement   unknown at 10:00 & 14:00  . escitalopram (LEXAPRO) 10 MG tablet Take 10 mg by mouth daily. For depression   unknown  . fentaNYL (DURAGESIC - DOSED MCG/HR) 50 MCG/HR Place 50 mcg onto the skin every other day. For pain   unknown  . furosemide (LASIX) 20 MG tablet Take 20 mg by mouth daily. For HTN/ edema   unknown  . HYDROcodone-acetaminophen (NORCO/VICODIN) 5-325 MG per tablet Take 1 tablet by mouth 3 (three) times daily. For pain   unknown at 600 & 12:00 & 21:30  . iron polysaccharides (NIFEREX) 150 MG capsule Take 150 mg by mouth daily.   unknown  . levalbuterol (XOPENEX) 0.63 MG/3ML nebulizer solution Take 0.63 mg by nebulization every 4 (four) hours as needed for wheezing or shortness of breath.   unknown  . levETIRAcetam (KEPPRA) 250 MG tablet Take 250 mg by mouth every 12 (twelve) hours. For seizures   unknown at 800 &2000  . levothyroxine (SYNTHROID, LEVOTHROID) 175 MCG tablet Take 175 mcg by mouth daily. For thyroid therapy   unknown at 6:00  . loratadine (CLARITIN) 10 MG tablet Take 10 mg by mouth daily. For allergies   unknown  . magnesium hydroxide (MILK OF MAGNESIA)  400 MG/5ML suspension Take 30 mLs by mouth daily as needed for mild constipation.     . metoprolol tartrate (LOPRESSOR) 25 MG tablet Take 25 mg by mouth daily. For HTN   unknown at 800  . Multiple Vitamin (MULTIVITAMIN WITH MINERALS) TABS Take 1 tablet by mouth daily. For nutritional supplement   unknown  . nystatin cream (MYCOSTATIN) Apply 1 application topically 2 (two) times daily. For rash / irritations     . omeprazole (PRILOSEC) 20 MG capsule Take 20 mg by mouth at bedtime. For reflux   unknown at 20:00  . phenylephrine-shark liver oil-mineral oil-petrolatum (PREPARATION H) 0.25-3-14-71.9 % rectal ointment  Place 1 application rectally as needed for hemorrhoids.     Marland Kitchen. rOPINIRole (REQUIP) 4 MG tablet Take 4 mg by mouth at bedtime. For parkinson disease   unknown at 20:00  . sennosides-docusate sodium (SENOKOT-S) 8.6-50 MG tablet Take 1 tablet by mouth 2 (two) times daily. For constipation   unknown  . zolpidem (AMBIEN) 5 MG tablet Take 5 mg by mouth at bedtime. For insomnia   unknown   Scheduled: Continuous: PRN:  Assesment: She has a right ankle wound. There is questionable osteomyelitis but the MRI is really nonspecific. She's been treated with antibiotics and seems to be improving. She has a history of severe osteoporosis with multiple compression fractures. She's had trouble with depression since she's been in the nursing home but that seems better. She has seizure disorder but no seizures recently. 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 My thanks to Dr. Leanord Hawkingobson for his help    LOS: 406 days   [name] L 07/25/2014, 7:40 AM

## 2014-07-31 ENCOUNTER — Non-Acute Institutional Stay (SKILLED_NURSING_FACILITY): Payer: Medicare Other | Admitting: Internal Medicine

## 2014-07-31 ENCOUNTER — Encounter (HOSPITAL_COMMUNITY)
Admission: RE | Admit: 2014-07-31 | Discharge: 2014-07-31 | Disposition: A | Discharge status: Home / Self Care | Payer: Medicare Other | Source: Skilled Nursing Facility | Attending: Pulmonary Disease | Admitting: Pulmonary Disease

## 2014-07-31 DIAGNOSIS — L03115 Cellulitis of right lower limb: Secondary | ICD-10-CM | POA: Diagnosis not present

## 2014-07-31 DIAGNOSIS — D649 Anemia, unspecified: Secondary | ICD-10-CM | POA: Diagnosis not present

## 2014-07-31 DIAGNOSIS — L97311 Non-pressure chronic ulcer of right ankle limited to breakdown of skin: Secondary | ICD-10-CM | POA: Insufficient documentation

## 2014-07-31 LAB — CBC WITH DIFFERENTIAL/PLATELET
BASOS ABS: 0 10*3/uL (ref 0.0–0.1)
BASOS PCT: 0 % (ref 0–1)
EOS ABS: 0.3 10*3/uL (ref 0.0–0.7)
Eosinophils Relative: 5 % (ref 0–5)
HEMATOCRIT: 26.4 % — AB (ref 36.0–46.0)
HEMOGLOBIN: 7.9 g/dL — AB (ref 12.0–15.0)
LYMPHS PCT: 11 % — AB (ref 12–46)
Lymphs Abs: 0.6 10*3/uL — ABNORMAL LOW (ref 0.7–4.0)
MCH: 28.5 pg (ref 26.0–34.0)
MCHC: 29.9 g/dL — ABNORMAL LOW (ref 30.0–36.0)
MCV: 95.3 fL (ref 78.0–100.0)
MONO ABS: 0.3 10*3/uL (ref 0.1–1.0)
MONOS PCT: 6 % (ref 3–12)
NEUTROS ABS: 3.8 10*3/uL (ref 1.7–7.7)
Neutrophils Relative %: 78 % — ABNORMAL HIGH (ref 43–77)
Platelets: 149 10*3/uL — ABNORMAL LOW (ref 150–400)
RBC: 2.77 MIL/uL — ABNORMAL LOW (ref 3.87–5.11)
RDW: 13 % (ref 11.5–15.5)
WBC: 5 10*3/uL (ref 4.0–10.5)

## 2014-08-01 ENCOUNTER — Encounter (HOSPITAL_COMMUNITY)
Admission: RE | Admit: 2014-08-01 | Discharge: 2014-08-01 | Disposition: A | Discharge status: Home / Self Care | Payer: Medicare Other | Source: Skilled Nursing Facility | Attending: Pulmonary Disease | Admitting: Pulmonary Disease

## 2014-08-01 DIAGNOSIS — L97311 Non-pressure chronic ulcer of right ankle limited to breakdown of skin: Secondary | ICD-10-CM | POA: Diagnosis not present

## 2014-08-01 LAB — OCCULT BLOOD X 1 CARD TO LAB, STOOL: FECAL OCCULT BLD: NEGATIVE

## 2014-08-01 NOTE — Progress Notes (Addendum)
Patient ID: [name], female   DOB: 1922/08/04, 81 y.o.   MRN: [id number]                PROGRESS NOTE  DATE:  07/31/2014             FACILITY: [facility]        LEVEL OF CARE:   SNF   Acute Visit   CHIEF COMPLAINT:  Review of wound over her right lateral malleolus.    HISTORY OF PRESENT ILLNESS:  This is a patient of Dr. Juanetta GoslingHawkins', who is an ongoing patient in the facility.    She developed a wound over her right lateral malleolus that has been there since the end of January, initially felt to be secondary to trauma on her wheelchair.  It opened and then closed, however, and then reopened.  ABIs were done on the leg that looked remarkably stable.  She had an MRI done on 07/14/2014 which suggested some degree of marrow edema over the lateral malleolus peripherally.  This was a worrisome finding for osteomyelitis.  There was surrounding cellulitis, as well.  I believe she received two weeks of Levaquin and there was some improvement.  However, she now appears to have more swelling and pain.      PHYSICAL EXAMINATION:                 SKIN:  INSPECTION:  Left foot:  There is a fair amount of edema in the entire foot which even goes above the ankle, but no convincing evidence for a DVT as of yet.  The area over the lateral malleolus is reasonably unchanged, although the drainage here looks more purulent.  Significant tenderness in the area of the lateral malleolus is also noted.    ASSESSMENT/PLAN:                            Cellulitis, possibly with underlying osteomyelitis on a traumatic wound in the right ankle over the lateral malleolus.  She has completed her two weeks' worth of antibiotics.  There was improvement.  Now, the area has regressed.  I have re-cultured this myself this afternoon.  I am going to start her on clindamycin.  It is possible she will need rounds of IV antibiotics.    Anemia.  I was just handed lab work on her.  I am not exactly sure how this was  ordered.  A CBC was done which shows a hemoglobin of 7.9.  This is down from 9.8 a year ago.  Her platelet counts were 114 a year ago, today 149.  Differential count on today's labs showed 78% neutrophils with a white count of 5.    It is noted that she has a very difficult set of antibiotic allergies including Bactrim, penicillins, doxycycline, and Cipro, although we have just given her Levaquin.

## 2014-08-02 LAB — IRON AND TIBC
Iron: 26 ug/dL — ABNORMAL LOW (ref 42–145)
SATURATION RATIOS: 12 % — AB (ref 20–55)
TIBC: 217 ug/dL — AB (ref 250–470)
UIBC: 191 ug/dL (ref 125–400)

## 2014-08-02 LAB — WOUND CULTURE: GRAM STAIN: NONE SEEN

## 2014-08-02 LAB — FERRITIN: FERRITIN: 30 ng/mL (ref 10–291)

## 2014-08-04 ENCOUNTER — Other Ambulatory Visit (HOSPITAL_COMMUNITY)
Admission: RE | Admit: 2014-08-04 | Discharge: 2014-08-04 | Disposition: A | Discharge status: Home / Self Care | Payer: Medicare Other | Source: Skilled Nursing Facility | Attending: Pulmonary Disease | Admitting: Pulmonary Disease

## 2014-08-04 DIAGNOSIS — R69 Illness, unspecified: Secondary | ICD-10-CM | POA: Diagnosis present

## 2014-08-04 LAB — OCCULT BLOOD X 1 CARD TO LAB, STOOL: Fecal Occult Bld: NEGATIVE

## 2014-08-05 ENCOUNTER — Inpatient Hospital Stay (HOSPITAL_COMMUNITY)
Admit: 2014-08-05 | Discharge: 2014-08-05 | Disposition: A | Discharge status: Home / Self Care | Payer: Medicare Other | Attending: Pulmonary Disease | Admitting: Pulmonary Disease

## 2014-08-05 ENCOUNTER — Other Ambulatory Visit (HOSPITAL_COMMUNITY)
Admission: AD | Admit: 2014-08-05 | Discharge: 2014-08-05 | Disposition: A | Discharge status: Home / Self Care | Payer: Medicare Other | Source: Skilled Nursing Facility | Attending: Pulmonary Disease | Admitting: Pulmonary Disease

## 2014-08-05 DIAGNOSIS — R69 Illness, unspecified: Secondary | ICD-10-CM | POA: Insufficient documentation

## 2014-08-05 DIAGNOSIS — D649 Anemia, unspecified: Secondary | ICD-10-CM | POA: Diagnosis not present

## 2014-08-05 LAB — CBC WITH DIFFERENTIAL/PLATELET
BASOS ABS: 0 10*3/uL (ref 0.0–0.1)
BASOS PCT: 0 % (ref 0–1)
BASOS PCT: 0 % (ref 0–1)
Basophils Absolute: 0 10*3/uL (ref 0.0–0.1)
Eosinophils Absolute: 0.3 10*3/uL (ref 0.0–0.7)
Eosinophils Absolute: 0 10*3/uL (ref 0.0–0.7)
Eosinophils Relative: 9 % — ABNORMAL HIGH (ref 0–5)
Eosinophils Relative: 0 % (ref 0–5)
HCT: 25.1 % — ABNORMAL LOW (ref 36.0–46.0)
HCT: 33.3 % — ABNORMAL LOW (ref 36.0–46.0)
HEMOGLOBIN: 10.1 g/dL — AB (ref 12.0–15.0)
Hemoglobin: 7.7 g/dL — ABNORMAL LOW (ref 12.0–15.0)
LYMPHS ABS: 0.4 10*3/uL — AB (ref 0.7–4.0)
Lymphocytes Relative: 21 % (ref 12–46)
Lymphocytes Relative: 4 % — ABNORMAL LOW (ref 12–46)
Lymphs Abs: 0.6 10*3/uL — ABNORMAL LOW (ref 0.7–4.0)
MCH: 28.7 pg (ref 26.0–34.0)
MCH: 28.4 pg (ref 26.0–34.0)
MCHC: 30.7 g/dL (ref 30.0–36.0)
MCHC: 30.3 g/dL (ref 30.0–36.0)
MCV: 93.7 fL (ref 78.0–100.0)
MCV: 93.5 fL (ref 78.0–100.0)
MONOS PCT: 3 % (ref 3–12)
Monocytes Absolute: 0.2 10*3/uL (ref 0.1–1.0)
Monocytes Absolute: 0.3 10*3/uL (ref 0.1–1.0)
Monocytes Relative: 9 % (ref 3–12)
NEUTROS ABS: 9.2 10*3/uL — AB (ref 1.7–7.7)
NEUTROS PCT: 92 % — AB (ref 43–77)
Neutro Abs: 1.7 10*3/uL (ref 1.7–7.7)
Neutrophils Relative %: 61 % (ref 43–77)
Platelets: 120 10*3/uL — ABNORMAL LOW (ref 150–400)
Platelets: 141 10*3/uL — ABNORMAL LOW (ref 150–400)
RBC: 2.68 MIL/uL — ABNORMAL LOW (ref 3.87–5.11)
RBC: 3.56 MIL/uL — AB (ref 3.87–5.11)
RDW: 12.7 % (ref 11.5–15.5)
RDW: 12.8 % (ref 11.5–15.5)
WBC: 2.8 10*3/uL — ABNORMAL LOW (ref 4.0–10.5)
WBC: 9.9 10*3/uL (ref 4.0–10.5)

## 2014-08-05 LAB — URINE MICROSCOPIC-ADD ON

## 2014-08-05 LAB — OCCULT BLOOD X 1 CARD TO LAB, STOOL: Fecal Occult Bld: NEGATIVE

## 2014-08-05 LAB — BASIC METABOLIC PANEL
Anion gap: 8 (ref 5–15)
BUN: 22 mg/dL (ref 6–23)
CHLORIDE: 94 mmol/L — AB (ref 96–112)
CO2: 36 mmol/L — ABNORMAL HIGH (ref 19–32)
CREATININE: 1.2 mg/dL — AB (ref 0.50–1.10)
Calcium: 9.3 mg/dL (ref 8.4–10.5)
GFR calc Af Amer: 45 mL/min — ABNORMAL LOW (ref >90)
GFR calc non Af Amer: 39 mL/min — ABNORMAL LOW (ref >90)
Glucose, Bld: 135 mg/dL — ABNORMAL HIGH (ref 70–99)
Potassium: 4.3 mmol/L (ref 3.5–5.1)
Sodium: 138 mmol/L (ref 135–145)

## 2014-08-05 LAB — URINALYSIS, ROUTINE W REFLEX MICROSCOPIC
Bilirubin Urine: NEGATIVE
Glucose, UA: NEGATIVE mg/dL
Hgb urine dipstick: NEGATIVE
Ketones, ur: NEGATIVE mg/dL
Leukocytes, UA: NEGATIVE
NITRITE: NEGATIVE
Protein, ur: 30 mg/dL — AB
SPECIFIC GRAVITY, URINE: 1.02 (ref 1.005–1.030)
UROBILINOGEN UA: 0.2 mg/dL (ref 0.0–1.0)
pH: 8.5 — ABNORMAL HIGH (ref 5.0–8.0)

## 2014-08-09 LAB — URINE CULTURE

## 2014-08-11 ENCOUNTER — Encounter (HOSPITAL_COMMUNITY): Payer: Self-pay

## 2014-08-11 ENCOUNTER — Other Ambulatory Visit (HOSPITAL_COMMUNITY)
Admission: AD | Admit: 2014-08-11 | Discharge: 2014-08-11 | Disposition: A | Discharge status: Home / Self Care | Payer: Medicare Other | Source: Skilled Nursing Facility | Attending: Pulmonary Disease | Admitting: Pulmonary Disease

## 2014-08-11 ENCOUNTER — Inpatient Hospital Stay (HOSPITAL_COMMUNITY)
Admission: EM | Admit: 2014-08-11 | Discharge: 2014-08-15 | DRG: 193 | Disposition: A | Discharge status: Skilled Nursing Facility | Payer: Medicare Other | Attending: Pulmonary Disease | Admitting: Pulmonary Disease

## 2014-08-11 ENCOUNTER — Emergency Department (HOSPITAL_COMMUNITY): Payer: Medicare Other

## 2014-08-11 DIAGNOSIS — Z66 Do not resuscitate: Secondary | ICD-10-CM | POA: Diagnosis present

## 2014-08-11 DIAGNOSIS — Z8522 Personal history of malignant neoplasm of nasal cavities, middle ear, and accessory sinuses: Secondary | ICD-10-CM

## 2014-08-11 DIAGNOSIS — L97319 Non-pressure chronic ulcer of right ankle with unspecified severity: Secondary | ICD-10-CM | POA: Diagnosis present

## 2014-08-11 DIAGNOSIS — E039 Hypothyroidism, unspecified: Secondary | ICD-10-CM | POA: Insufficient documentation

## 2014-08-11 DIAGNOSIS — M810 Age-related osteoporosis without current pathological fracture: Secondary | ICD-10-CM

## 2014-08-11 DIAGNOSIS — E46 Unspecified protein-calorie malnutrition: Secondary | ICD-10-CM | POA: Insufficient documentation

## 2014-08-11 DIAGNOSIS — L97909 Non-pressure chronic ulcer of unspecified part of unspecified lower leg with unspecified severity: Secondary | ICD-10-CM

## 2014-08-11 DIAGNOSIS — E872 Acidosis: Secondary | ICD-10-CM

## 2014-08-11 DIAGNOSIS — J441 Chronic obstructive pulmonary disease with (acute) exacerbation: Secondary | ICD-10-CM | POA: Diagnosis present

## 2014-08-11 DIAGNOSIS — R739 Hyperglycemia, unspecified: Secondary | ICD-10-CM | POA: Diagnosis present

## 2014-08-11 DIAGNOSIS — J9622 Acute and chronic respiratory failure with hypercapnia: Secondary | ICD-10-CM | POA: Diagnosis present

## 2014-08-11 DIAGNOSIS — Y95 Nosocomial condition: Secondary | ICD-10-CM | POA: Diagnosis present

## 2014-08-11 DIAGNOSIS — K219 Gastro-esophageal reflux disease without esophagitis: Secondary | ICD-10-CM | POA: Diagnosis present

## 2014-08-11 DIAGNOSIS — J440 Chronic obstructive pulmonary disease with acute lower respiratory infection: Secondary | ICD-10-CM | POA: Diagnosis present

## 2014-08-11 DIAGNOSIS — R569 Unspecified convulsions: Secondary | ICD-10-CM

## 2014-08-11 DIAGNOSIS — I10 Essential (primary) hypertension: Secondary | ICD-10-CM | POA: Diagnosis present

## 2014-08-11 DIAGNOSIS — R0602 Shortness of breath: Secondary | ICD-10-CM | POA: Diagnosis not present

## 2014-08-11 DIAGNOSIS — R69 Illness, unspecified: Secondary | ICD-10-CM

## 2014-08-11 DIAGNOSIS — Z79891 Long term (current) use of opiate analgesic: Secondary | ICD-10-CM

## 2014-08-11 DIAGNOSIS — J189 Pneumonia, unspecified organism: Secondary | ICD-10-CM | POA: Diagnosis not present

## 2014-08-11 DIAGNOSIS — I83009 Varicose veins of unspecified lower extremity with ulcer of unspecified site: Secondary | ICD-10-CM | POA: Diagnosis present

## 2014-08-11 DIAGNOSIS — G8929 Other chronic pain: Secondary | ICD-10-CM | POA: Diagnosis present

## 2014-08-11 DIAGNOSIS — E8729 Other acidosis: Secondary | ICD-10-CM

## 2014-08-11 DIAGNOSIS — J9621 Acute and chronic respiratory failure with hypoxia: Secondary | ICD-10-CM | POA: Diagnosis present

## 2014-08-11 LAB — CBC WITH DIFFERENTIAL/PLATELET
Basophils Absolute: 0 10*3/uL (ref 0.0–0.1)
Basophils Relative: 1 % (ref 0–1)
EOS PCT: 6 % — AB (ref 0–5)
Eosinophils Absolute: 0.2 10*3/uL (ref 0.0–0.7)
HEMATOCRIT: 29.9 % — AB (ref 36.0–46.0)
Hemoglobin: 8.9 g/dL — ABNORMAL LOW (ref 12.0–15.0)
LYMPHS ABS: 0.6 10*3/uL — AB (ref 0.7–4.0)
LYMPHS PCT: 16 % (ref 12–46)
MCH: 28.1 pg (ref 26.0–34.0)
MCHC: 29.8 g/dL — ABNORMAL LOW (ref 30.0–36.0)
MCV: 94.3 fL (ref 78.0–100.0)
MONO ABS: 0.4 10*3/uL (ref 0.1–1.0)
Monocytes Relative: 11 % (ref 3–12)
Neutro Abs: 2.5 10*3/uL (ref 1.7–7.7)
Neutrophils Relative %: 66 % (ref 43–77)
Platelets: 196 10*3/uL (ref 150–400)
RBC: 3.17 MIL/uL — ABNORMAL LOW (ref 3.87–5.11)
RDW: 12.5 % (ref 11.5–15.5)
WBC: 3.7 10*3/uL — AB (ref 4.0–10.5)

## 2014-08-11 LAB — IRON AND TIBC
Iron: 46 ug/dL (ref 42–145)
SATURATION RATIOS: 17 % — AB (ref 20–55)
TIBC: 270 ug/dL (ref 250–470)
UIBC: 224 ug/dL (ref 125–400)

## 2014-08-11 LAB — VITAMIN B12: Vitamin B-12: 685 pg/mL (ref 211–911)

## 2014-08-11 MED ORDER — IPRATROPIUM-ALBUTEROL 0.5-2.5 (3) MG/3ML IN SOLN
RESPIRATORY_TRACT | Status: AC
  Administered 2014-08-11: 3 mL via RESPIRATORY_TRACT
  Filled 2014-08-11: qty 3

## 2014-08-11 MED ORDER — ALBUTEROL SULFATE (2.5 MG/3ML) 0.083% IN NEBU
INHALATION_SOLUTION | RESPIRATORY_TRACT | Status: AC
  Administered 2014-08-11: 2.5 mg
  Filled 2014-08-11: qty 3

## 2014-08-11 MED ORDER — METHYLPREDNISOLONE SODIUM SUCC 125 MG IJ SOLR
125.0000 mg | Freq: Once | INTRAMUSCULAR | Status: AC
  Administered 2014-08-12: 125 mg via INTRAVENOUS
  Filled 2014-08-11: qty 2

## 2014-08-11 MED ORDER — FUROSEMIDE 10 MG/ML IJ SOLN
40.0000 mg | Freq: Once | INTRAMUSCULAR | Status: AC
  Administered 2014-08-12: 40 mg via INTRAVENOUS
  Filled 2014-08-11: qty 4

## 2014-08-11 NOTE — ED Provider Notes (Signed)
This chart was scribed for [name] Maw Riaan Toledo, DO by Bronson Curb, ED Scribe. This patient was seen in room APA02/APA02 and the patient's care was started at 11:25 PM.  TIME SEEN: 2325  CHIEF COMPLAINT: Respiratory Distress  LEVEL 5 CAVEAT: RESPIRATORY DISTRESS  HPI:   HPI Comments: [name] is a 81 y.o. female with history of seizures on Keppra, COPD on 3 L of oxygen, hypothyroidism, hypertension brought in by ambulance, who presents to the Emergency Department for respiratory distress. Per EMS, patient is from [facility] and was found, by staff, in respiratory distress with an O2 saturation of 54% on her chronic 3 L. EMS reports the patient was initially pale and diaphoretic with audible rales in the upper and lower lobes, but notes a return of her color when she was placed on Cpap.  Patient denies any chest pain. Reports feeling better on C Pap.  Per daughter, Patient was here in January of 2015 and was diagnosed with the flu and pneumonia and has had respiratory issues ever since. Daughter reports she is constantly on O2. She reports the patient has been sick this past week with a 102 F fever last week and associated cough. Daughter also reports the patient had a fall 2 years ago and reports her O2 saturation dropped to 85%. She denies history of CHF.  Family confirms patient is a DO NOT RESUSCITATE/DO NOT INTUBATE but they would like admission and treatment. Daughter is at bedside and are aware the patient is critically ill.  ROS: Level V caveat for respiratory distress, altered mental status  PAST MEDICAL HISTORY/PAST SURGICAL HISTORY:  Past Medical History  Diagnosis Date  . Seizures   . Aspiration pneumonia   . Dysphagia   . Osteoporosis   . Thyroid disease   . Depression   . MRSA (methicillin resistant staph aureus) culture positive   . Respiratory failure   . Hypokalemia   . Reflux   . Vertebral compression fracture     thoracic and lumbar  . History of sinus  cancer 1970's    left side  . Chronic pain   . Hypertension   . GERD (gastroesophageal reflux disease)     MEDICATIONS:  Prior to Admission medications   Medication Sig Start Date End Date Taking? Authorizing Provider  acetaminophen (TYLENOL) 325 MG tablet Take 650 mg by mouth every 4 (four) hours as needed. Pain/fever    Historical Provider, MD  alum & mag hydroxide-simeth (MYLANTA) 200-200-20 MG/5ML suspension Take 30 mLs by mouth every 4 (four) hours as needed for indigestion or heartburn.    Historical Provider, MD  AZO-CRANBERRY PO Take 1 tablet by mouth 2 (two) times daily. For prevention of urinary tract infections.    Historical Provider, MD  calcium-vitamin D (OSCAL WITH D) 500-200 MG-UNIT per tablet Take 1 tablet by mouth 3 (three) times daily. For osteoporosis    Historical Provider, MD  carboxymethylcellulose (REFRESH PLUS) 0.5 % SOLN Place 1 drop into the right eye every 6 (six) hours as needed. Dry eyes    Historical Provider, MD  docusate sodium (COLACE) 100 MG capsule Take 100 mg by mouth 2 (two) times daily. For constipation    Historical Provider, MD  ENSURE PLUS (ENSURE PLUS) LIQD Take 237 mLs by mouth 2 (two) times daily between meals. Nutritional supplement    Historical Provider, MD  escitalopram (LEXAPRO) 10 MG tablet Take 10 mg by mouth daily. For depression    Historical Provider, MD  fentaNYL (DURAGESIC -  DOSED MCG/HR) 50 MCG/HR Place 50 mcg onto the skin every other day. For pain    Historical Provider, MD  furosemide (LASIX) 20 MG tablet Take 20 mg by mouth daily. For HTN/ edema    Historical Provider, MD  HYDROcodone-acetaminophen (NORCO/VICODIN) 5-325 MG per tablet Take 1 tablet by mouth 3 (three) times daily. For pain    Historical Provider, MD  HYDROcodone-acetaminophen (NORCO/VICODIN) 5-325 MG per tablet Take one tablet by mouth three times daily and every 6 hours as needed for pain 01/26/14   Oneal Grout, MD  iron polysaccharides (NIFEREX) 150 MG capsule Take  150 mg by mouth daily.    Historical Provider, MD  levalbuterol Pauline Aus) 0.63 MG/3ML nebulizer solution Take 0.63 mg by nebulization every 4 (four) hours as needed for wheezing or shortness of breath.    Historical Provider, MD  levETIRAcetam (KEPPRA) 250 MG tablet Take 250 mg by mouth every 12 (twelve) hours. For seizures    Historical Provider, MD  levothyroxine (SYNTHROID, LEVOTHROID) 175 MCG tablet Take 175 mcg by mouth daily. For thyroid therapy    Historical Provider, MD  loratadine (CLARITIN) 10 MG tablet Take 10 mg by mouth daily. For allergies    Historical Provider, MD  magnesium hydroxide (MILK OF MAGNESIA) 400 MG/5ML suspension Take 30 mLs by mouth daily as needed for mild constipation.    Historical Provider, MD  metoprolol tartrate (LOPRESSOR) 25 MG tablet Take 25 mg by mouth daily. For HTN    Historical Provider, MD  Multiple Vitamin (MULTIVITAMIN WITH MINERALS) TABS Take 1 tablet by mouth daily. For nutritional supplement    Historical Provider, MD  nystatin cream (MYCOSTATIN) Apply 1 application topically 2 (two) times daily. For rash / irritations    Historical Provider, MD  omeprazole (PRILOSEC) 20 MG capsule Take 20 mg by mouth at bedtime. For reflux    Historical Provider, MD  phenylephrine-shark liver oil-mineral oil-petrolatum (PREPARATION H) 0.25-3-14-71.9 % rectal ointment Place 1 application rectally as needed for hemorrhoids.    Historical Provider, MD  rOPINIRole (REQUIP) 4 MG tablet Take 4 mg by mouth at bedtime. For parkinson disease    Historical Provider, MD  sennosides-docusate sodium (SENOKOT-S) 8.6-50 MG tablet Take 1 tablet by mouth 2 (two) times daily. For constipation    Historical Provider, MD  zolpidem (AMBIEN) 5 MG tablet Take 5 mg by mouth at bedtime. For insomnia    Historical Provider, MD    ALLERGIES:  Allergies  Allergen Reactions  . Aricept [Donepezil Hcl]   . Bactrim [Sulfamethoxazole-Trimethoprim]   . Ciprofloxacin   . Darvocet [Propoxyphene  N-Acetaminophen]   . Namenda [Memantine Hcl]   . Penicillins   . Vibramycin [Doxycycline Calcium]     SOCIAL HISTORY:  History  Substance Use Topics  . Smoking status: Never Smoker   . Smokeless tobacco: Not on file  . Alcohol Use: No    FAMILY HISTORY: No family history on file.  EXAM: BP 130/67 mmHg  Pulse 121  Temp(Src) 99.1 F (37.3 C) (Rectal)  Resp 21  SpO2 98% CONSTITUTIONAL: Alert. Elderly. Chronically ill appearing. In distress. Unable to answer questions secondary to respiratory distress but will speak several words at a time, patient is extremely kyphotic HEAD: Normocephalic EYES: Conjunctivae clear, PERRL ENT: normal nose; no rhinorrhea; moist mucous membranes; pharynx without lesions noted NECK: Supple, no meningismus, no LAD; JVD to the level of the mandible. CARD: Regular and Tachycardic; S1 and S2 appreciated; no murmurs, no clicks, no rubs, no gallops RESP: Normal chest  excursion without splinting; Diminished aeration diffusely; Moderate respiratory distress. Hypoxic; tachypneic, mild expiratory wheezing, no rhonchi or rales, I am able to appreciate gastric sounds when listening to the anterior chest ABD/GI: Normal bowel sounds; non-distended; soft, non-tender, no rebound, no guarding BACK:  The back appears normal and is non-tender to palpation, there is no CVA tenderness, patient is extremely kyphotic EXT: Normal ROM in all joints; non-tender to palpation; no peripheral edema; normal capillary refill; no cyanosis, no peripheral edema or calf tenderness or swelling    SKIN: Normal color for age and race; warm NEURO: Moves all extremities equally PSYCH: The patient's mood and manner are appropriate. Grooming and personal hygiene are appropriate.  MEDICAL DECISION MAKING: Patient here with severe respiratory distress, hypoxia. Doing better on BiPAP. She appears to have JVD and may have volume overload. We'll give Lasix. Also has diminished breath sounds  diffusely. Will give continuous albuterol, Solu-Medrol. Will obtain labs, chest x-ray and ABG.  ED PROGRESS: Patient's labs show a pH of 7.2 with a PCO2 of 86.9. We'll continue BiPAP. Lactate also elevated at 2.9 but she has no leukocytosis or fever. Patient did have a drop in her blood pressure which may have been secondary to Lasix but also secondary to BiPAP. Improving with IV fluids and adjusting BiPAP settings. Chest x-ray shows bibasilar atelectasis and large hiatal hernia.   Patient continues to improve. Discussed with Dr. Julian ReilGardner with hospitalist service for admission to stepdown.    CRITICAL CARE Performed by: Raelyn NumberWARD, Britiny Defrain N   Total critical care time: 45 minutes  Critical care time was exclusive of separately billable procedures and treating other patients.  Critical care was necessary to treat or prevent imminent or life-threatening deterioration.  Critical care was time spent personally by me on the following activities: development of treatment plan with patient and/or surrogate as well as nursing, discussions with consultants, evaluation of patient's response to treatment, examination of patient, obtaining history from patient or surrogate, ordering and performing treatments and interventions, ordering and review of laboratory studies, ordering and review of radiographic studies, pulse oximetry and re-evaluation of patient's condition.     I personally performed the services described in this documentation, which was scribed in my presence. The recorded information has been reviewed and is accurate.    [name] MawKristen N Keriann Rankin, DO 08/12/14 [phone]

## 2014-08-11 NOTE — ED Notes (Signed)
Pt is from [facility], reportedly found to be in resp distress with Sat of 54% per staff.

## 2014-08-12 ENCOUNTER — Inpatient Hospital Stay (HOSPITAL_COMMUNITY): Payer: Medicare Other

## 2014-08-12 DIAGNOSIS — J441 Chronic obstructive pulmonary disease with (acute) exacerbation: Secondary | ICD-10-CM | POA: Diagnosis present

## 2014-08-12 DIAGNOSIS — Z66 Do not resuscitate: Secondary | ICD-10-CM | POA: Diagnosis present

## 2014-08-12 DIAGNOSIS — R569 Unspecified convulsions: Secondary | ICD-10-CM | POA: Diagnosis present

## 2014-08-12 DIAGNOSIS — E039 Hypothyroidism, unspecified: Secondary | ICD-10-CM | POA: Diagnosis present

## 2014-08-12 DIAGNOSIS — Z79891 Long term (current) use of opiate analgesic: Secondary | ICD-10-CM | POA: Diagnosis not present

## 2014-08-12 DIAGNOSIS — K219 Gastro-esophageal reflux disease without esophagitis: Secondary | ICD-10-CM | POA: Diagnosis present

## 2014-08-12 DIAGNOSIS — J189 Pneumonia, unspecified organism: Secondary | ICD-10-CM | POA: Diagnosis present

## 2014-08-12 DIAGNOSIS — G8929 Other chronic pain: Secondary | ICD-10-CM | POA: Diagnosis present

## 2014-08-12 DIAGNOSIS — R739 Hyperglycemia, unspecified: Secondary | ICD-10-CM | POA: Diagnosis present

## 2014-08-12 DIAGNOSIS — J9622 Acute and chronic respiratory failure with hypercapnia: Secondary | ICD-10-CM

## 2014-08-12 DIAGNOSIS — J9621 Acute and chronic respiratory failure with hypoxia: Secondary | ICD-10-CM

## 2014-08-12 DIAGNOSIS — L97319 Non-pressure chronic ulcer of right ankle with unspecified severity: Secondary | ICD-10-CM | POA: Diagnosis present

## 2014-08-12 DIAGNOSIS — R0602 Shortness of breath: Secondary | ICD-10-CM | POA: Diagnosis present

## 2014-08-12 DIAGNOSIS — M810 Age-related osteoporosis without current pathological fracture: Secondary | ICD-10-CM | POA: Diagnosis present

## 2014-08-12 DIAGNOSIS — I10 Essential (primary) hypertension: Secondary | ICD-10-CM | POA: Diagnosis present

## 2014-08-12 DIAGNOSIS — J440 Chronic obstructive pulmonary disease with acute lower respiratory infection: Secondary | ICD-10-CM | POA: Diagnosis present

## 2014-08-12 DIAGNOSIS — Y95 Nosocomial condition: Secondary | ICD-10-CM | POA: Diagnosis present

## 2014-08-12 DIAGNOSIS — Z8522 Personal history of malignant neoplasm of nasal cavities, middle ear, and accessory sinuses: Secondary | ICD-10-CM | POA: Diagnosis not present

## 2014-08-12 LAB — BLOOD GAS, ARTERIAL
Acid-Base Excess: 7.1 mmol/L — ABNORMAL HIGH (ref 0.0–2.0)
Bicarbonate: 34.5 mEq/L — ABNORMAL HIGH (ref 20.0–24.0)
DELIVERY SYSTEMS: POSITIVE
Drawn by: 22223
Expiratory PAP: 6
FIO2: 100 %
Inspiratory PAP: 14
O2 Saturation: 98.8 %
PH ART: 7.223 — AB (ref 7.350–7.450)
PO2 ART: 211 mmHg — AB (ref 80.0–100.0)
Patient temperature: 37
TCO2: 33.5 mmol/L (ref 0–100)
pCO2 arterial: 86.9 mmHg (ref 35.0–45.0)

## 2014-08-12 LAB — TROPONIN I: TROPONIN I: 0.03 ng/mL (ref <0.031)

## 2014-08-12 LAB — COMPREHENSIVE METABOLIC PANEL
ALBUMIN: 3.8 g/dL (ref 3.5–5.2)
ALT: 30 U/L (ref 0–35)
ANION GAP: 11 (ref 5–15)
AST: 63 U/L — ABNORMAL HIGH (ref 0–37)
Alkaline Phosphatase: 89 U/L (ref 39–117)
BILIRUBIN TOTAL: 0.4 mg/dL (ref 0.3–1.2)
BUN: 29 mg/dL — ABNORMAL HIGH (ref 6–23)
CHLORIDE: 94 mmol/L — AB (ref 96–112)
CO2: 34 mmol/L — ABNORMAL HIGH (ref 19–32)
CREATININE: 1.3 mg/dL — AB (ref 0.50–1.10)
Calcium: 9.7 mg/dL (ref 8.4–10.5)
GFR, EST AFRICAN AMERICAN: 41 mL/min — AB (ref >90)
GFR, EST NON AFRICAN AMERICAN: 35 mL/min — AB (ref >90)
GLUCOSE: 200 mg/dL — AB (ref 70–99)
Potassium: 3.7 mmol/L (ref 3.5–5.1)
Sodium: 139 mmol/L (ref 135–145)
Total Protein: 8 g/dL (ref 6.0–8.3)

## 2014-08-12 LAB — CBC WITH DIFFERENTIAL/PLATELET
BASOS ABS: 0.1 10*3/uL (ref 0.0–0.1)
Basophils Relative: 1 % (ref 0–1)
EOS PCT: 7 % — AB (ref 0–5)
Eosinophils Absolute: 0.6 10*3/uL (ref 0.0–0.7)
HEMATOCRIT: 34.6 % — AB (ref 36.0–46.0)
Hemoglobin: 10.2 g/dL — ABNORMAL LOW (ref 12.0–15.0)
Lymphocytes Relative: 36 % (ref 12–46)
Lymphs Abs: 3 10*3/uL (ref 0.7–4.0)
MCH: 28.1 pg (ref 26.0–34.0)
MCHC: 29.5 g/dL — ABNORMAL LOW (ref 30.0–36.0)
MCV: 95.3 fL (ref 78.0–100.0)
Monocytes Absolute: 0.5 10*3/uL (ref 0.1–1.0)
Monocytes Relative: 6 % (ref 3–12)
NEUTROS ABS: 4.2 10*3/uL (ref 1.7–7.7)
NEUTROS PCT: 50 % (ref 43–77)
Platelets: 282 10*3/uL (ref 150–400)
RBC: 3.63 MIL/uL — ABNORMAL LOW (ref 3.87–5.11)
RDW: 12.6 % (ref 11.5–15.5)
WBC: 8.3 10*3/uL (ref 4.0–10.5)

## 2014-08-12 LAB — GLUCOSE, CAPILLARY
GLUCOSE-CAPILLARY: 136 mg/dL — AB (ref 70–99)
GLUCOSE-CAPILLARY: 138 mg/dL — AB (ref 70–99)
Glucose-Capillary: 144 mg/dL — ABNORMAL HIGH (ref 70–99)
Glucose-Capillary: 132 mg/dL — ABNORMAL HIGH (ref 70–99)

## 2014-08-12 LAB — MRSA PCR SCREENING: MRSA by PCR: NEGATIVE

## 2014-08-12 LAB — URINALYSIS, ROUTINE W REFLEX MICROSCOPIC
Bilirubin Urine: NEGATIVE
Glucose, UA: NEGATIVE mg/dL
KETONES UR: NEGATIVE mg/dL
LEUKOCYTES UA: NEGATIVE
NITRITE: NEGATIVE
PH: 7.5 (ref 5.0–8.0)
Protein, ur: 30 mg/dL — AB
Specific Gravity, Urine: 1.02 (ref 1.005–1.030)
Urobilinogen, UA: 0.2 mg/dL (ref 0.0–1.0)

## 2014-08-12 LAB — URINE MICROSCOPIC-ADD ON

## 2014-08-12 LAB — LACTIC ACID, PLASMA
LACTIC ACID, VENOUS: 2.9 mmol/L — AB (ref 0.5–2.0)
LACTIC ACID, VENOUS: 4.3 mmol/L — AB (ref 0.5–2.0)
Lactic Acid, Venous: 2.9 mmol/L (ref 0.5–2.0)
Lactic Acid, Venous: 3.7 mmol/L (ref 0.5–2.0)

## 2014-08-12 LAB — FOLATE RBC
Folate, Hemolysate: 540.5 ng/mL
Folate, RBC: 1877 ng/mL (ref >498)
Hematocrit: 28.8 % — ABNORMAL LOW (ref 34.0–46.6)

## 2014-08-12 LAB — BRAIN NATRIURETIC PEPTIDE: B NATRIURETIC PEPTIDE 5: 212 pg/mL — AB (ref 0.0–100.0)

## 2014-08-12 MED ORDER — DEXTROSE 5 % IV SOLN
500.0000 mg | INTRAVENOUS | Status: DC
  Administered 2014-08-12: 500 mg via INTRAVENOUS
  Filled 2014-08-12 (×4): qty 500

## 2014-08-12 MED ORDER — VANCOMYCIN HCL 500 MG IV SOLR
500.0000 mg | INTRAVENOUS | Status: DC
  Administered 2014-08-13 – 2014-08-15 (×3): 500 mg via INTRAVENOUS
  Filled 2014-08-12 (×4): qty 500

## 2014-08-12 MED ORDER — NYSTATIN 100000 UNIT/GM EX CREA
1.0000 "application " | TOPICAL_CREAM | Freq: Two times a day (BID) | CUTANEOUS | Status: DC
  Administered 2014-08-12 – 2014-08-15 (×7): 1 via TOPICAL
  Filled 2014-08-12: qty 15

## 2014-08-12 MED ORDER — ESCITALOPRAM OXALATE 10 MG PO TABS
10.0000 mg | ORAL_TABLET | Freq: Every day | ORAL | Status: DC
  Administered 2014-08-12 – 2014-08-15 (×4): 10 mg via ORAL
  Filled 2014-08-12 (×5): qty 1

## 2014-08-12 MED ORDER — HEPARIN SODIUM (PORCINE) 5000 UNIT/ML IJ SOLN
5000.0000 [IU] | Freq: Three times a day (TID) | INTRAMUSCULAR | Status: DC
  Administered 2014-08-12 – 2014-08-15 (×10): 5000 [IU] via SUBCUTANEOUS
  Filled 2014-08-12 (×10): qty 1

## 2014-08-12 MED ORDER — INSULIN ASPART 100 UNIT/ML ~~LOC~~ SOLN
0.0000 [IU] | Freq: Three times a day (TID) | SUBCUTANEOUS | Status: DC
  Administered 2014-08-15: 1 [IU] via SUBCUTANEOUS

## 2014-08-12 MED ORDER — HYDROCODONE-ACETAMINOPHEN 5-325 MG PO TABS
1.0000 | ORAL_TABLET | Freq: Four times a day (QID) | ORAL | Status: DC | PRN
  Administered 2014-08-12 – 2014-08-15 (×9): 1 via ORAL
  Filled 2014-08-12 (×10): qty 1

## 2014-08-12 MED ORDER — ENSURE ENLIVE PO LIQD
237.0000 mL | Freq: Two times a day (BID) | ORAL | Status: DC
  Administered 2014-08-12 – 2014-08-15 (×7): 237 mL via ORAL

## 2014-08-12 MED ORDER — CHLORHEXIDINE GLUCONATE 0.12 % MT SOLN
15.0000 mL | Freq: Two times a day (BID) | OROMUCOSAL | Status: DC
  Administered 2014-08-12 (×2): 15 mL via OROMUCOSAL
  Filled 2014-08-12 (×2): qty 15

## 2014-08-12 MED ORDER — DEXTROSE 5 % IV SOLN
1.0000 g | Freq: Three times a day (TID) | INTRAVENOUS | Status: DC
  Administered 2014-08-12 – 2014-08-15 (×10): 1 g via INTRAVENOUS
  Filled 2014-08-12 (×13): qty 1

## 2014-08-12 MED ORDER — ROPINIROLE HCL 1 MG PO TABS
4.0000 mg | ORAL_TABLET | Freq: Every day | ORAL | Status: DC
  Administered 2014-08-12 – 2014-08-14 (×3): 4 mg via ORAL
  Filled 2014-08-12 (×5): qty 4

## 2014-08-12 MED ORDER — LEVALBUTEROL HCL 0.63 MG/3ML IN NEBU: 0.6300 mg | INHALATION_SOLUTION | RESPIRATORY_TRACT | Status: DC | PRN

## 2014-08-12 MED ORDER — MAGNESIUM HYDROXIDE 400 MG/5ML PO SUSP: 30.0000 mL | Freq: Every day | ORAL | Status: DC | PRN

## 2014-08-12 MED ORDER — FENTANYL 50 MCG/HR TD PT72
50.0000 ug | MEDICATED_PATCH | TRANSDERMAL | Status: DC
Start: 2014-08-12 — End: 2014-08-15
  Administered 2014-08-12 – 2014-08-14 (×2): 50 ug via TRANSDERMAL
  Filled 2014-08-12 (×2): qty 1

## 2014-08-12 MED ORDER — VANCOMYCIN HCL IN DEXTROSE 1-5 GM/200ML-% IV SOLN
1000.0000 mg | Freq: Once | INTRAVENOUS | Status: AC
  Administered 2014-08-12: 1000 mg via INTRAVENOUS
  Filled 2014-08-12: qty 200

## 2014-08-12 MED ORDER — POLYSACCHARIDE IRON COMPLEX 150 MG PO CAPS
150.0000 mg | ORAL_CAPSULE | Freq: Every day | ORAL | Status: DC
  Administered 2014-08-12 – 2014-08-15 (×4): 150 mg via ORAL
  Filled 2014-08-12 (×4): qty 1

## 2014-08-12 MED ORDER — IPRATROPIUM-ALBUTEROL 0.5-2.5 (3) MG/3ML IN SOLN
3.0000 mL | RESPIRATORY_TRACT | Status: DC
  Administered 2014-08-11 – 2014-08-15 (×19): 3 mL via RESPIRATORY_TRACT
  Filled 2014-08-12 (×19): qty 3

## 2014-08-12 MED ORDER — ACETAMINOPHEN 325 MG PO TABS: 650.0000 mg | ORAL_TABLET | ORAL | Status: DC | PRN

## 2014-08-12 MED ORDER — METOPROLOL TARTRATE 25 MG PO TABS
25.0000 mg | ORAL_TABLET | Freq: Every day | ORAL | Status: DC
  Administered 2014-08-12 – 2014-08-15 (×4): 25 mg via ORAL
  Filled 2014-08-12 (×5): qty 1

## 2014-08-12 MED ORDER — LORATADINE 10 MG PO TABS
10.0000 mg | ORAL_TABLET | Freq: Every day | ORAL | Status: DC
  Administered 2014-08-12 – 2014-08-15 (×4): 10 mg via ORAL
  Filled 2014-08-12 (×5): qty 1

## 2014-08-12 MED ORDER — LEVOTHYROXINE SODIUM 75 MCG PO TABS
175.0000 ug | ORAL_TABLET | Freq: Every day | ORAL | Status: DC
  Administered 2014-08-12 – 2014-08-15 (×4): 175 ug via ORAL
  Filled 2014-08-12 (×10): qty 1

## 2014-08-12 MED ORDER — COLLAGENASE 250 UNIT/GM EX OINT
TOPICAL_OINTMENT | Freq: Every day | CUTANEOUS | Status: DC
  Administered 2014-08-12 – 2014-08-15 (×4): via TOPICAL
  Filled 2014-08-12: qty 30

## 2014-08-12 MED ORDER — SENNOSIDES-DOCUSATE SODIUM 8.6-50 MG PO TABS
1.0000 | ORAL_TABLET | Freq: Two times a day (BID) | ORAL | Status: DC
  Administered 2014-08-12 – 2014-08-15 (×7): 1 via ORAL
  Filled 2014-08-12 (×9): qty 1

## 2014-08-12 MED ORDER — FUROSEMIDE 20 MG PO TABS
20.0000 mg | ORAL_TABLET | Freq: Every day | ORAL | Status: DC
  Administered 2014-08-12 – 2014-08-15 (×4): 20 mg via ORAL
  Filled 2014-08-12 (×5): qty 1

## 2014-08-12 MED ORDER — DOCUSATE SODIUM 100 MG PO CAPS
100.0000 mg | ORAL_CAPSULE | Freq: Two times a day (BID) | ORAL | Status: DC
  Administered 2014-08-12 – 2014-08-15 (×6): 100 mg via ORAL
  Filled 2014-08-12 (×6): qty 1

## 2014-08-12 MED ORDER — METHYLPREDNISOLONE SODIUM SUCC 125 MG IJ SOLR
60.0000 mg | Freq: Two times a day (BID) | INTRAMUSCULAR | Status: DC
  Administered 2014-08-12 – 2014-08-15 (×7): 60 mg via INTRAVENOUS
  Filled 2014-08-12 (×7): qty 2

## 2014-08-12 MED ORDER — CETYLPYRIDINIUM CHLORIDE 0.05 % MT LIQD
7.0000 mL | Freq: Two times a day (BID) | OROMUCOSAL | Status: DC
  Administered 2014-08-12 (×2): 7 mL via OROMUCOSAL

## 2014-08-12 MED ORDER — ENSURE PLUS PO LIQD: 237.0000 mL | Freq: Two times a day (BID) | ORAL | Status: DC

## 2014-08-12 MED ORDER — LEVETIRACETAM 250 MG PO TABS
250.0000 mg | ORAL_TABLET | Freq: Two times a day (BID) | ORAL | Status: DC
  Administered 2014-08-12 – 2014-08-15 (×7): 250 mg via ORAL
  Filled 2014-08-12 (×8): qty 1

## 2014-08-12 MED ORDER — PANTOPRAZOLE SODIUM 40 MG PO TBEC
40.0000 mg | DELAYED_RELEASE_TABLET | Freq: Every day | ORAL | Status: DC
Start: 2014-08-12 — End: 2014-08-15
  Administered 2014-08-12 – 2014-08-15 (×4): 40 mg via ORAL
  Filled 2014-08-12 (×5): qty 1

## 2014-08-12 NOTE — Care Management Note (Addendum)
    Page 1 of 1   08/15/2014     9:23:53 AM CARE MANAGEMENT NOTE 08/15/2014  Patient:  [name],[name]   Account Number:  [id number]  Date Initiated:  08/12/2014  Documentation initiated by:  CHILDRESS,JESSICA  Subjective/Objective Assessment:   [facility] Pt uses O2 chornically. Pt plans to return to SNF at discharge. CSW is aware and will arrange for return to facility. No CM needs.     Action/Plan:   Anticipated DC Date:  08/14/2014   Anticipated DC Plan:  SKILLED NURSING FACILITY  In-house referral  Clinical Social Worker      DC Planning Services  CM consult      Choice offered to / List presented to:             Status of service:  Completed, signed off Medicare Important Message given?  YES (If response is "NO", the following Medicare IM given date fields will be blank) Date Medicare IM given:  08/12/2014 Medicare IM given by:  Kathyrn SheriffHILDRESS,JESSICA Date Additional Medicare IM given:  08/15/2014 Additional Medicare IM given by:  Sharrie RothmanAMMY Kaiser Atisha Hamidi  Discharge Disposition:  SKILLED NURSING FACILITY  Per UR Regulation:  Reviewed for med. necessity/level of care/duration of stay  If discussed at Long Length of Stay Meetings, dates discussed:    Comments:  08/15/14 0920 Arlyss Queenammy Rainah Kirshner, RN BSN CM Pt discharged [facility]. CSW to arrange discharge to facility.  08/12/2014 1130 Kathyrn SheriffJessica Childress, RN, MSN, CM

## 2014-08-12 NOTE — Clinical Social Work Psychosocial (Signed)
Clinical Social Work Department BRIEF PSYCHOSOCIAL ASSESSMENT 08/12/2014  Patient:  [name],[name]     Account Number:  [id number]     Admit date:  08/11/2014  Clinical Social Worker:  Trilby LeaverSETTLE,Marlena Barbato, LCSW  Date/Time:  08/12/2014 12:42 PM  Referred by:  CSW  Date Referred:  08/12/2014 Referred for  SNF Placement   Other Referral:   Interview type:  Family Other interview type:   Tobe SosGay Citty, Daughter    PSYCHOSOCIAL DATA Living Status:  FACILITY [facility] Level of care:  Skilled Nursing Facility Primary support name:  Tobe SosGay Citty Primary support relationship to patient:  CHILD, ADULT Degree of support available:   Family is very supportive.    CURRENT CONCERNS Current Concerns  Post-Acute Placement   Other Concerns:    SOCIAL WORK ASSESSMENT / PLAN CSW could not access patient as she is on BiPap.  CSW spoke with patient's daughter, Tobe SosGay Citty, who indicted that [facility]  She indicated that patient is typically oriented. Mrs. Citty indicated that patient uses a wheelchair.  She stated that the family [facility].  [facility] Keri confirmed Mrs. Citty's statements.  She indeicated that patient is an extensive assist but she is able to use the bathroom unassited and feed herself. She indicated that patient could return to the facility. Keri indicated that patient would only need an FL2 if she stayed three days and would be returning under her Medicare days.   Assessment/plan status:  [facility] Other assessment/ plan:   Information/referral to community resources:    PATIENT'S/FAMILY'S RESPONSE TO PLAN OF CARE: Family and facililty agreeable [facility]    Tretha SciaraHeather Laiden Milles, KentuckyLCSW [phone]

## 2014-08-12 NOTE — Progress Notes (Signed)
CRITICAL VALUE ALERT  Critical value received:  Lactic Acid 4.3  Date of notification:  08/12/14  Time of notification:  1125  Critical value read back:Yes.    Nurse who received alert:  Laury AxonMichael Donyell Carrell, RN   MD notified (1st page):  Dr. Juanetta GoslingHawkins.    Time of first page:  1125  MD notified (2nd page):  Time of second page:  Responding MD:  Dr. Juanetta GoslingHawkins  Time MD responded:  [phone]

## 2014-08-12 NOTE — Consult Note (Addendum)
WOC wound consult note performed via [employer] Reason for Consult: ankle wound Wound type: Ulcer right ankle, unclear etiology.  Pts caregiver reports she bumped on the wheel chair.  No significant edema, palpable pulses per bedside nursing staff. MRI 07/14/14 suspect for osteomyelitis. Facility MD aware of these findings.  Measurement: 1.0cm x 0.7cm x 0.5cm  Wound bed:100% yellow slough in base  Drainage (amount, consistency, odor) minimal Periwound: intact  Dressing procedure/placement/frequency: Enzymatic debridement ointment applied daily, cover with saline moist gauze, change daily.    Discussed POC with patient and bedside nurse.  Re consult if needed, will not follow at this time. Thanks  Yania Bogie Foot Locker[employer]in RN, CWOCN [phone])

## 2014-08-12 NOTE — Progress Notes (Signed)
Subjective: She was admitted from the skilled care facility with respiratory distress and appears to have pneumonia. She has significantly improved from earlier this morning. She did require BiPAP but now she is on nasal oxygen and looks comfortable  Objective: Vital signs in last 24 hours: Temp:  [97.7 F (36.5 C)-99.1 F (37.3 C)] 97.7 F (36.5 C) (03/25 0735) Pulse Rate:  [75-121] 86 (03/25 0900) Resp:  [15-31] 18 (03/25 0900) BP: (81-176)/(38-78) 140/57 mmHg (03/25 0900) SpO2:  [89 %-100 %] 100 % (03/25 0900) FiO2 (%):  [35 %-100 %] 35 % (03/25 0751) Weight:  [42.4 kg (93 lb 7.6 oz)] 42.4 kg (93 lb 7.6 oz) (03/25 0400) Weight change:  Last BM Date:  (unknown)  Intake/Output from previous day: 03/24 0701 - 03/25 0700 In: -  Out: [place] [Urine:1375]  PHYSICAL EXAM General appearance: alert, cooperative, moderate distress and 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 Resp: Rales on the right with some rhonchi bilaterally Cardio: regular rate and rhythm, S1, S2 normal, no murmur, click, rub or gallop GI: soft, non-tender; bowel sounds normal; no masses,  no organomegaly Extremities: extremities normal, atraumatic, no cyanosis or edema  Lab Results:  Results for orders placed or performed during the hospital encounter of 08/11/14 (from the past 48 hour(s))  CBC with Differential     Status: Abnormal   Collection Time: 08/11/14 11:50 PM  Result Value Ref Range   WBC 8.3 4.0 - 10.5 K/uL   RBC 3.63 (L) 3.87 - 5.11 MIL/uL   Hemoglobin 10.2 (L) 12.0 - 15.0 g/dL   HCT 34.6 (L) 36.0 - 46.0 %   MCV 95.3 78.0 - 100.0 fL   MCH 28.1 26.0 - 34.0 pg   MCHC 29.5 (L) 30.0 - 36.0 g/dL   RDW 12.6 11.5 - 15.5 %   Platelets 282 150 - 400 K/uL    Comment: DELTA CHECK NOTED   Neutrophils Relative % 50 43 - 77 %   Neutro Abs 4.2 1.7 - 7.7 K/uL   Lymphocytes Relative 36 12 - 46 %   Lymphs Abs 3.0 0.7 - 4.0 K/uL   Monocytes Relative 6 3 - 12 %   Monocytes Absolute 0.5 0.1 - 1.0 K/uL    Eosinophils Relative 7 (H) 0 - 5 %   Eosinophils Absolute 0.6 0.0 - 0.7 K/uL   Basophils Relative 1 0 - 1 %   Basophils Absolute 0.1 0.0 - 0.1 K/uL  Comprehensive metabolic panel     Status: Abnormal   Collection Time: 08/11/14 11:50 PM  Result Value Ref Range   Sodium 139 135 - 145 mmol/L   Potassium 3.7 3.5 - 5.1 mmol/L   Chloride 94 (L) 96 - 112 mmol/L   CO2 34 (H) 19 - 32 mmol/L   Glucose, Bld 200 (H) 70 - 99 mg/dL   BUN 29 (H) 6 - 23 mg/dL   Creatinine, Ser 1.30 (H) 0.50 - 1.10 mg/dL   Calcium 9.7 8.4 - 10.5 mg/dL   Total Protein 8.0 6.0 - 8.3 g/dL   Albumin 3.8 3.5 - 5.2 g/dL   AST 63 (H) 0 - 37 U/L   ALT 30 0 - 35 U/L   Alkaline Phosphatase 89 39 - 117 U/L   Total Bilirubin 0.4 0.3 - 1.2 mg/dL   GFR calc non Af Amer 35 (L) >90 mL/min   GFR calc Af Amer 41 (L) >90 mL/min    Comment: (NOTE) The eGFR has been calculated using the CKD EPI equation. This  calculation has not been validated in all clinical situations. eGFR's persistently <90 mL/min signify possible Chronic Kidney Disease.    Anion gap 11 5 - 15  Brain natriuretic peptide     Status: Abnormal   Collection Time: 08/11/14 11:50 PM  Result Value Ref Range   B Natriuretic Peptide 212.0 (H) 0.0 - 100.0 pg/mL  Troponin I     Status: None   Collection Time: 08/11/14 11:50 PM  Result Value Ref Range   Troponin I 0.03 <0.031 ng/mL    Comment:        NO INDICATION OF MYOCARDIAL INJURY.   Blood culture (routine x 2)     Status: None (Preliminary result)   Collection Time: 08/11/14 11:50 PM  Result Value Ref Range   Specimen Description BLOOD LEFT ARM    Special Requests BOTTLES DRAWN AEROBIC AND ANAEROBIC 4CC EACH    Culture PENDING    Report Status PENDING   Blood gas, arterial ([facility] & [facility] ONLY)     Status: Abnormal   Collection Time: 08/12/14 12:01 AM  Result Value Ref Range   FIO2 100.00 %   Delivery systems BILEVEL POSITIVE AIRWAY PRESSURE    Inspiratory PAP 14    Expiratory PAP 6    pH, Arterial 7.223  (L) 7.350 - 7.450   pCO2 arterial 86.9 (HH) 35.0 - 45.0 mmHg    Comment: CRITICAL RESULT CALLED TO, READ BACK BY AND VERIFIED WITH:  AMANDA KELLY RN BY K KNICK RRT RCP ON 08/12/2014 AT 0008    pO2, Arterial 211.0 (H) 80.0 - 100.0 mmHg   Bicarbonate 34.5 (H) 20.0 - 24.0 mEq/L   TCO2 33.5 0 - 100 mmol/L   Acid-Base Excess 7.1 (H) 0.0 - 2.0 mmol/L   O2 Saturation 98.8 %   Patient temperature 37.0    Collection site LEFT RADIAL    Drawn by [id number]    Sample type ARTERIAL    Allens test (pass/fail) PASS PASS  Lactic acid, plasma     Status: Abnormal   Collection Time: 08/12/14 12:20 AM  Result Value Ref Range   Lactic Acid, Venous 2.9 (HH) 0.5 - 2.0 mmol/L    Comment: RESULT REPEATED AND VERIFIED CRITICAL RESULT CALLED TO, READ BACK BY AND VERIFIED WITH: KELLY A 0143 ON [id number] BY FORSYTH K   Blood culture (routine x 2)     Status: None (Preliminary result)   Collection Time: 08/12/14 12:20 AM  Result Value Ref Range   Specimen Description BLOOD LEFT HAND    Special Requests      BOTTLES DRAWN AEROBIC AND ANAEROBIC AEB 5CC ANA 4CC   Culture PENDING    Report Status PENDING   Urinalysis, Routine w reflex microscopic     Status: Abnormal   Collection Time: 08/12/14  1:18 AM  Result Value Ref Range   Color, Urine YELLOW YELLOW   APPearance CLEAR CLEAR   Specific Gravity, Urine 1.020 1.005 - 1.030   pH 7.5 5.0 - 8.0   Glucose, UA NEGATIVE NEGATIVE mg/dL   Hgb urine dipstick TRACE (A) NEGATIVE   Bilirubin Urine NEGATIVE NEGATIVE   Ketones, ur NEGATIVE NEGATIVE mg/dL   Protein, ur 30 (A) NEGATIVE mg/dL   Urobilinogen, UA 0.2 0.0 - 1.0 mg/dL   Nitrite NEGATIVE NEGATIVE   Leukocytes, UA NEGATIVE NEGATIVE  Urine microscopic-add on     Status: Abnormal   Collection Time: 08/12/14  1:18 AM  Result Value Ref Range   Squamous Epithelial / LPF FEW (A) RARE  WBC, UA 0-2 <3 WBC/hpf   RBC / HPF 0-2 <3 RBC/hpf   Bacteria, UA MANY (A) RARE  Lactic acid, plasma     Status: Abnormal    Collection Time: 08/12/14  2:40 AM  Result Value Ref Range   Lactic Acid, Venous 2.9 (HH) 0.5 - 2.0 mmol/L    Comment: RESULT REPEATED AND VERIFIED CRITICAL VALUE NOTED.  VALUE IS CONSISTENT WITH PREVIOUSLY REPORTED AND CALLED VALUE.   MRSA PCR Screening     Status: None   Collection Time: 08/12/14  7:01 AM  Result Value Ref Range   MRSA by PCR NEGATIVE NEGATIVE    Comment:        The GeneXpert MRSA Assay (FDA approved for NASAL specimens only), is one component of a comprehensive MRSA colonization surveillance program. It is not intended to diagnose MRSA infection nor to guide or monitor treatment for MRSA infections.   Glucose, capillary     Status: Abnormal   Collection Time: 08/12/14  7:15 AM  Result Value Ref Range   Glucose-Capillary 144 (H) 70 - 99 mg/dL   Comment 1 Notify RN    Comment 2 Document in Chart     ABGS  Recent Labs  08/12/14 0001  PHART 7.223*  PO2ART 211.0*  TCO2 33.5  HCO3 34.5*   CULTURES Recent Results (from the past 240 hour(s))  Urine culture     Status: None   Collection Time: 08/05/14  7:45 PM  Result Value Ref Range Status   Specimen Description URINE, CATHETERIZED  Final   Special Requests NONE  Final   Colony Count   Final    >=100,000 COLONIES/ML Performed at [facility]    Culture   Final    PROTEUS MIRABILIS Performed at [facility]    Report Status 08/09/2014 FINAL  Final   Organism ID, Bacteria PROTEUS MIRABILIS  Final      Susceptibility   Proteus mirabilis - MIC*    AMPICILLIN <=2 SENSITIVE Sensitive     CEFAZOLIN 8 SENSITIVE Sensitive     CEFTRIAXONE <=1 SENSITIVE Sensitive     CIPROFLOXACIN >=4 RESISTANT Resistant     GENTAMICIN 8 INTERMEDIATE Intermediate     LEVOFLOXACIN >=8 RESISTANT Resistant     NITROFURANTOIN 256 RESISTANT Resistant     TOBRAMYCIN 4 SENSITIVE Sensitive     TRIMETH/SULFA >=320 RESISTANT Resistant     PIP/TAZO <=4 SENSITIVE Sensitive     * PROTEUS MIRABILIS  Blood  culture (routine x 2)     Status: None (Preliminary result)   Collection Time: 08/11/14 11:50 PM  Result Value Ref Range Status   Specimen Description BLOOD LEFT ARM  Final   Special Requests BOTTLES DRAWN AEROBIC AND ANAEROBIC 4CC EACH  Final   Culture PENDING  Incomplete   Report Status PENDING  Incomplete  Blood culture (routine x 2)     Status: None (Preliminary result)   Collection Time: 08/12/14 12:20 AM  Result Value Ref Range Status   Specimen Description BLOOD LEFT HAND  Final   Special Requests   Final    BOTTLES DRAWN AEROBIC AND ANAEROBIC AEB 5CC ANA 4CC   Culture PENDING  Incomplete   Report Status PENDING  Incomplete  MRSA PCR Screening     Status: None   Collection Time: 08/12/14  7:01 AM  Result Value Ref Range Status   MRSA by PCR NEGATIVE NEGATIVE Final    Comment:        The GeneXpert MRSA Assay (  FDA approved for NASAL specimens only), is one component of a comprehensive MRSA colonization surveillance program. It is not intended to diagnose MRSA infection nor to guide or monitor treatment for MRSA infections.    Studies/Results: Dg Chest 1v Repeat Same Day  08/12/2014   CLINICAL DATA:  Acute respiratory failure with hypercapnia  EXAM: CHEST - 1 VIEW SAME DAY  COMPARISON:  08/12/2014  FINDINGS: Streaky right base opacity, similar to previous.  No appreciable change in cardiomegaly or aortic tortuosity. Large hiatal hernia is again noted. No edema, effusion, or pneumothorax.  IMPRESSION: Right basilar atelectasis or pneumonia, unchanged from earlier today.   Electronically Signed   By: Monte Fantasia M.D.   On: 08/12/2014 07:52   Dg Chest Portable 1 View  08/12/2014   CLINICAL DATA:  81 year old female with respiratory distress. History of with fluid and pneumonia January 2016.  EXAM: PORTABLE CHEST - 1 VIEW  COMPARISON:  08/05/2014  FINDINGS: The patient's chin obscures the right mid and upper hemithorax. Lung volumes remain low. Bibasilar atelectasis again  seen. There is a large hiatal hernia with increased air compared to prior. The heart size is normal. No large pleural effusion.  IMPRESSION: Bibasilar atelectasis. The patient's chin obscures the right mid and upper hemithorax, repeat imaging recommended. Large hiatal hernia with increased air component compared to prior exam.   Electronically Signed   By: Jeb Levering M.D.   On: 08/12/2014 01:02    Medications:  Prior to Admission:  Prescriptions prior to admission  Medication Sig Dispense Refill Last Dose  . acetaminophen (TYLENOL) 325 MG tablet Take 650 mg by mouth every 4 (four) hours as needed. Pain/fever   unknown  . alum & mag hydroxide-simeth (MYLANTA) 200-200-20 MG/5ML suspension Take 30 mLs by mouth every 4 (four) hours as needed for indigestion or heartburn.     . AZO-CRANBERRY PO Take 1 tablet by mouth 2 (two) times daily. For prevention of urinary tract infections.   unknown at 800 & 2000  . calcium-vitamin D (OSCAL WITH D) 500-200 MG-UNIT per tablet Take 1 tablet by mouth 3 (three) times daily. For osteoporosis   unknown at 8:00 & 17:00 & 20:00  . carboxymethylcellulose (REFRESH PLUS) 0.5 % SOLN Place 1 drop into the right eye every 6 (six) hours as needed. Dry eyes   unknown  . docusate sodium (COLACE) 100 MG capsule Take 100 mg by mouth 2 (two) times daily. For constipation   unknown  . ENSURE PLUS (ENSURE PLUS) LIQD Take 237 mLs by mouth 2 (two) times daily between meals. Nutritional supplement   unknown at 10:00 & 14:00  . escitalopram (LEXAPRO) 10 MG tablet Take 10 mg by mouth daily. For depression   unknown  . fentaNYL (DURAGESIC - DOSED MCG/HR) 50 MCG/HR Place 50 mcg onto the skin every other day. For pain   unknown  . furosemide (LASIX) 20 MG tablet Take 20 mg by mouth daily. For HTN/ edema   unknown  . HYDROcodone-acetaminophen (NORCO/VICODIN) 5-325 MG per tablet Take 1 tablet by mouth 3 (three) times daily. For pain   unknown at 600 & 12:00 & 21:30  .  HYDROcodone-acetaminophen (NORCO/VICODIN) 5-325 MG per tablet Take one tablet by mouth three times daily and every 6 hours as needed for pain 210 tablet 0   . iron polysaccharides (NIFEREX) 150 MG capsule Take 150 mg by mouth daily.   unknown  . levalbuterol (XOPENEX) 0.63 MG/3ML nebulizer solution Take 0.63 mg by nebulization every 4 (four) hours  as needed for wheezing or shortness of breath.   unknown  . levETIRAcetam (KEPPRA) 250 MG tablet Take 250 mg by mouth every 12 (twelve) hours. For seizures   unknown at 800 &2000  . levothyroxine (SYNTHROID, LEVOTHROID) 175 MCG tablet Take 175 mcg by mouth daily. For thyroid therapy   unknown at 6:00  . loratadine (CLARITIN) 10 MG tablet Take 10 mg by mouth daily. For allergies   unknown  . magnesium hydroxide (MILK OF MAGNESIA) 400 MG/5ML suspension Take 30 mLs by mouth daily as needed for mild constipation.     . metoprolol tartrate (LOPRESSOR) 25 MG tablet Take 25 mg by mouth daily. For HTN   unknown at 29  . Multiple Vitamin (MULTIVITAMIN WITH MINERALS) TABS Take 1 tablet by mouth daily. For nutritional supplement   unknown  . nystatin cream (MYCOSTATIN) Apply 1 application topically 2 (two) times daily. For rash / irritations     . omeprazole (PRILOSEC) 20 MG capsule Take 20 mg by mouth at bedtime. For reflux   unknown at 20:00  . phenylephrine-shark liver oil-mineral oil-petrolatum (PREPARATION H) 0.25-3-14-71.9 % rectal ointment Place 1 application rectally as needed for hemorrhoids.     Marland Kitchen rOPINIRole (REQUIP) 4 MG tablet Take 4 mg by mouth at bedtime. For parkinson disease   unknown at 20:00  . sennosides-docusate sodium (SENOKOT-S) 8.6-50 MG tablet Take 1 tablet by mouth 2 (two) times daily. For constipation   unknown  . zolpidem (AMBIEN) 5 MG tablet Take 5 mg by mouth at bedtime. For insomnia   unknown   Scheduled: . antiseptic oral rinse  7 mL Mouth Rinse q12n4p  . azithromycin  500 mg Intravenous Q24H  . chlorhexidine  15 mL Mouth Rinse BID   . docusate sodium  100 mg Oral BID  . escitalopram  10 mg Oral Daily  . feeding supplement (ENSURE ENLIVE)  237 mL Oral BID BM  . fentaNYL  50 mcg Transdermal Q48H  . furosemide  20 mg Oral Daily  . heparin  5,000 Units Subcutaneous 3 times per day  . insulin aspart  0-9 Units Subcutaneous TID WC  . ipratropium-albuterol  3 mL Nebulization Q4H  . iron polysaccharides  150 mg Oral Daily  . levETIRAcetam  250 mg Oral Q12H  . levothyroxine  175 mcg Oral QAC breakfast  . loratadine  10 mg Oral Daily  . methylPREDNISolone (SOLU-MEDROL) injection  60 mg Intravenous Q12H  . metoprolol tartrate  25 mg Oral Daily  . nystatin cream  1 application Topical BID  . pantoprazole  40 mg Oral Daily  . rOPINIRole  4 mg Oral QHS  . senna-docusate  1 tablet Oral BID   Continuous:  JQG:[id number], HYDROcodone-acetaminophen, levalbuterol, magnesium hydroxide  Assesment: She has pneumonia. This is healthcare associated. She has acute on chronic respiratory failure with hypoxia and hypercapnia. She is improving. She has an ulceration on her right ankle which has been being treated at her skilled care facility and I suspect that current antibiotics will help some. Active Problems:   Convulsions/seizures   Hypothyroidism   COPD exacerbation   Acute on chronic respiratory failure with hypercapnia   Acute on chronic respiratory failure with hypoxia    Plan: Antibiotics for pneumonia. She has received steroids. She is generally better and will continue current treatments.    LOS: 0 days   Gerrard Crystal L 08/12/2014, 9:32 AM

## 2014-08-12 NOTE — ED Notes (Signed)
CRITICAL VALUE ALERT  Critical value received:  Lactic acid 2.9  Date of notification:  08/12/14  Time of notification:  0143  Critical value read back:yes  Nurse who received alert:  Juanita LasterAmanda Latesha Chesney, RN  MD notified (1st page):  Ward  Time of first page:  0145  MD notified (2nd page):  Time of second page:  Responding MD:  Ward  Time MD responded:  [phone]

## 2014-08-12 NOTE — Progress Notes (Signed)
ANTIBIOTIC CONSULT NOTE - INITIAL  Pharmacy Consult for Vancomycin and Aztreonam Indication: pneumonia  Allergies  Allergen Reactions  . Aricept [Donepezil Hcl]   . Bactrim [Sulfamethoxazole-Trimethoprim]   . Ciprofloxacin   . Darvocet [Propoxyphene N-Acetaminophen]   . Namenda [Memantine Hcl]   . Penicillins   . Vibramycin [Doxycycline Calcium]    Patient Measurements: Height: 5' (152.4 cm) Weight: 93 lb 7.6 oz (42.4 kg) IBW/kg (Calculated) : 45.5  Vital Signs: Temp: 97.7 F (36.5 C) (03/25 0735) Temp Source: Axillary (03/25 0735) BP: 140/57 mmHg (03/25 0900) Pulse Rate: 86 (03/25 0900) Intake/Output from previous day: 03/24 0701 - 03/25 0700 In: -  Out: 1375 [Urine:1375] Intake/Output from this shift:    Labs:  Recent Labs  08/11/14 0745 08/11/14 2350  WBC 3.7* 8.3  HGB 8.9* 10.2*  PLT 196 282  CREATININE  --  1.30*   Estimated Creatinine Clearance: 19.6 mL/min (by C-G formula based on Cr of 1.3). No results for input(s): VANCOTROUGH, VANCOPEAK, VANCORANDOM, GENTTROUGH, GENTPEAK, GENTRANDOM, TOBRATROUGH, TOBRAPEAK, TOBRARND, AMIKACINPEAK, AMIKACINTROU, AMIKACIN in the last 72 hours.   Microbiology: Recent Results (from the past 720 hour(s))  Wound culture     Status: None   Collection Time: 07/31/14  3:30 PM  Result Value Ref Range Status   Specimen Description WOUND ANKLE  Final   Special Requests NONE  Final   Gram Stain   Final    NO WBC SEEN NO SQUAMOUS EPITHELIAL CELLS SEEN FEW GRAM VARIABLE ROD Performed at [facility]    Culture   Final    ABUNDANT DIPHTHEROIDS(CORYNEBACTERIUM SPECIES) Note: Standardized susceptibility testing for this organism is not available. Performed at [facility]    Report Status 08/02/2014 FINAL  Final  Urine culture     Status: None   Collection Time: 08/05/14  7:45 PM  Result Value Ref Range Status   Specimen Description URINE, CATHETERIZED  Final   Special Requests NONE  Final   Colony Count    Final    >=100,000 COLONIES/ML Performed at [facility]    Culture   Final    PROTEUS MIRABILIS Performed at [facility]    Report Status 08/09/2014 FINAL  Final   Organism ID, Bacteria PROTEUS MIRABILIS  Final      Susceptibility   Proteus mirabilis - MIC*    AMPICILLIN <=2 SENSITIVE Sensitive     CEFAZOLIN 8 SENSITIVE Sensitive     CEFTRIAXONE <=1 SENSITIVE Sensitive     CIPROFLOXACIN >=4 RESISTANT Resistant     GENTAMICIN 8 INTERMEDIATE Intermediate     LEVOFLOXACIN >=8 RESISTANT Resistant     NITROFURANTOIN 256 RESISTANT Resistant     TOBRAMYCIN 4 SENSITIVE Sensitive     TRIMETH/SULFA >=320 RESISTANT Resistant     PIP/TAZO <=4 SENSITIVE Sensitive     * PROTEUS MIRABILIS  Blood culture (routine x 2)     Status: None (Preliminary result)   Collection Time: 08/11/14 11:50 PM  Result Value Ref Range Status   Specimen Description BLOOD LEFT ARM  Final   Special Requests BOTTLES DRAWN AEROBIC AND ANAEROBIC 4CC EACH  Final   Culture PENDING  Incomplete   Report Status PENDING  Incomplete  Blood culture (routine x 2)     Status: None (Preliminary result)   Collection Time: 08/12/14 12:20 AM  Result Value Ref Range Status   Specimen Description BLOOD LEFT HAND  Final   Special Requests   Final    BOTTLES DRAWN AEROBIC AND ANAEROBIC AEB  5CC ANA 4CC   Culture PENDING  Incomplete   Report Status PENDING  Incomplete  MRSA PCR Screening     Status: None   Collection Time: 08/12/14  7:01 AM  Result Value Ref Range Status   MRSA by PCR NEGATIVE NEGATIVE Final    Comment:        The GeneXpert MRSA Assay (FDA approved for NASAL specimens only), is one component of a comprehensive MRSA colonization surveillance program. It is not intended to diagnose MRSA infection nor to guide or monitor treatment for MRSA infections.     Medical History: Past Medical History  Diagnosis Date  . Seizures   . Aspiration pneumonia   . Dysphagia   . Osteoporosis   .  Thyroid disease   . Depression   . MRSA (methicillin resistant staph aureus) culture positive   . Respiratory failure   . Hypokalemia   . Reflux   . Vertebral compression fracture     thoracic and lumbar  . History of sinus cancer 1970's    left side  . Chronic pain   . Hypertension   . GERD (gastroesophageal reflux disease)    Vancomycin 3/25 >> Aztreonam 3/25 >>  Assessment: 81yo female with small body habitus.  Admitted with respiratory distress and suspected pneumonia.  SCr is 1.3.  Estimated Normalized ClCr ~ 25-30  Goal of Therapy:  Vancomycin trough level 15-20 mcg/ml Eradicate infection.  Plan:  Vancomycin 1000mg  IV today x 1 then Vancomycin 500mg  IV q24hrs Check trough level at steady state Aztreonam 1gm IV q8h Monitor labs, renal fxn, and cultures  Valrie HartHall, Saia Derossett A 08/12/2014,10:19 AM

## 2014-08-12 NOTE — H&P (Addendum)
[facility] History and Physical  [name] GMW:[id number] DOB: 01-15-1923 DOA: 08/11/2014  Referring physician: EDP PCP: [name] MaudlinHAWKINS,EDWARD L, MD   Chief Complaint: Respiratory distress   HPI: [name] is a 81 y.o. female with h/o COPD, on chronic 3L O2 at the nursing home following a severe bout of HCAP and influenza last year.  Patient presents to the ED in respiratory distress.  [facility]  Per daughter patient has been sick this past week with T of 102F last week.  Associated cough which is much worse today.  Patient much improved on BIPAP.  Review of Systems: Systems reviewed.  As above, otherwise negative  Past Medical History  Diagnosis Date  . Seizures   . Aspiration pneumonia   . Dysphagia   . Osteoporosis   . Thyroid disease   . Depression   . MRSA (methicillin resistant staph aureus) culture positive   . Respiratory failure   . Hypokalemia   . Reflux   . Vertebral compression fracture     thoracic and lumbar  . History of sinus cancer 1970's    left side  . Chronic pain   . Hypertension   . GERD (gastroesophageal reflux disease)    Past Surgical History  Procedure Laterality Date  . Kyphoplasty      Lumbar and thoracic  . Palate surgery  1970's    removal of hard and soft palate on left side  . Enucleation Left 1970's    left eye  . Cholecystectomy    . Appendectomy    . Cesarean section     Social History:  reports that she has never smoked. She does not have any smokeless tobacco history on file. She reports that she does not drink alcohol or use illicit drugs.  Allergies  Allergen Reactions  . Aricept [Donepezil Hcl]   . Bactrim [Sulfamethoxazole-Trimethoprim]   . Ciprofloxacin   . Darvocet [Propoxyphene N-Acetaminophen]   . Namenda [Memantine Hcl]   . Penicillins   . Vibramycin [Doxycycline Calcium]     No family history on file.   Prior to Admission medications   Medication  Sig Start Date End Date Taking? Authorizing Provider  acetaminophen (TYLENOL) 325 MG tablet Take 650 mg by mouth every 4 (four) hours as needed. Pain/fever    Historical Provider, MD  alum & mag hydroxide-simeth (MYLANTA) 200-200-20 MG/5ML suspension Take 30 mLs by mouth every 4 (four) hours as needed for indigestion or heartburn.    Historical Provider, MD  AZO-CRANBERRY PO Take 1 tablet by mouth 2 (two) times daily. For prevention of urinary tract infections.    Historical Provider, MD  calcium-vitamin D (OSCAL WITH D) 500-200 MG-UNIT per tablet Take 1 tablet by mouth 3 (three) times daily. For osteoporosis    Historical Provider, MD  carboxymethylcellulose (REFRESH PLUS) 0.5 % SOLN Place 1 drop into the right eye every 6 (six) hours as needed. Dry eyes    Historical Provider, MD  docusate sodium (COLACE) 100 MG capsule Take 100 mg by mouth 2 (two) times daily. For constipation    Historical Provider, MD  ENSURE PLUS (ENSURE PLUS) LIQD Take 237 mLs by mouth 2 (two) times daily between meals. Nutritional supplement    Historical Provider, MD  escitalopram (LEXAPRO) 10 MG tablet Take 10 mg by mouth daily. For depression    Historical Provider, MD  fentaNYL (DURAGESIC - DOSED MCG/HR) 50 MCG/HR Place 50 mcg onto the skin  every other day. For pain    Historical Provider, MD  furosemide (LASIX) 20 MG tablet Take 20 mg by mouth daily. For HTN/ edema    Historical Provider, MD  HYDROcodone-acetaminophen (NORCO/VICODIN) 5-325 MG per tablet Take 1 tablet by mouth 3 (three) times daily. For pain    Historical Provider, MD  HYDROcodone-acetaminophen (NORCO/VICODIN) 5-325 MG per tablet Take one tablet by mouth three times daily and every 6 hours as needed for pain 01/26/14   Oneal Grout, MD  iron polysaccharides (NIFEREX) 150 MG capsule Take 150 mg by mouth daily.    Historical Provider, MD  levalbuterol Pauline Aus) 0.63 MG/3ML nebulizer solution Take 0.63 mg by nebulization every 4 (four) hours as needed for  wheezing or shortness of breath.    Historical Provider, MD  levETIRAcetam (KEPPRA) 250 MG tablet Take 250 mg by mouth every 12 (twelve) hours. For seizures    Historical Provider, MD  levothyroxine (SYNTHROID, LEVOTHROID) 175 MCG tablet Take 175 mcg by mouth daily. For thyroid therapy    Historical Provider, MD  loratadine (CLARITIN) 10 MG tablet Take 10 mg by mouth daily. For allergies    Historical Provider, MD  magnesium hydroxide (MILK OF MAGNESIA) 400 MG/5ML suspension Take 30 mLs by mouth daily as needed for mild constipation.    Historical Provider, MD  metoprolol tartrate (LOPRESSOR) 25 MG tablet Take 25 mg by mouth daily. For HTN    Historical Provider, MD  Multiple Vitamin (MULTIVITAMIN WITH MINERALS) TABS Take 1 tablet by mouth daily. For nutritional supplement    Historical Provider, MD  nystatin cream (MYCOSTATIN) Apply 1 application topically 2 (two) times daily. For rash / irritations    Historical Provider, MD  omeprazole (PRILOSEC) 20 MG capsule Take 20 mg by mouth at bedtime. For reflux    Historical Provider, MD  phenylephrine-shark liver oil-mineral oil-petrolatum (PREPARATION H) 0.25-3-14-71.9 % rectal ointment Place 1 application rectally as needed for hemorrhoids.    Historical Provider, MD  rOPINIRole (REQUIP) 4 MG tablet Take 4 mg by mouth at bedtime. For parkinson disease    Historical Provider, MD  sennosides-docusate sodium (SENOKOT-S) 8.6-50 MG tablet Take 1 tablet by mouth 2 (two) times daily. For constipation    Historical Provider, MD  zolpidem (AMBIEN) 5 MG tablet Take 5 mg by mouth at bedtime. For insomnia    Historical Provider, MD   Physical Exam: Filed Vitals:   08/12/14 0331  BP:   Pulse: 91  Temp:   Resp: 20    BP 123/56 mmHg  Pulse 91  Temp(Src) 99.1 F (37.3 C) (Rectal)  Resp 20  Wt 42.4 kg (93 lb 7.6 oz)  SpO2 100%  General Appearance:    Alert, oriented, no distress, appears stated age  Head:    Normocephalic, atraumatic  Eyes:    PERRL,  EOMI, sclera non-icteric        Nose:   Nares without drainage or epistaxis. Mucosa, turbinates normal  Throat:   Moist mucous membranes. Oropharynx without erythema or exudate.  Neck:   Supple. No carotid bruits.  No thyromegaly.  No lymphadenopathy.   Back:     No CVA tenderness, no spinal tenderness  Lungs:     Diminished bilaterally  Chest wall:    No tenderness to palpitation  Heart:    Regular rate and rhythm without murmurs, gallops, rubs  Abdomen:     Soft, non-tender, nondistended, normal bowel sounds, no organomegaly  Genitalia:    deferred  Rectal:    deferred  Extremities:   No clubbing, cyanosis or edema.  Pulses:   2+ and symmetric all extremities  Skin:   Skin color, texture, turgor normal, no rashes or lesions  Lymph nodes:   Cervical, supraclavicular, and axillary nodes normal  Neurologic:   CNII-XII intact. Normal strength, sensation and reflexes      throughout    Labs on Admission:  Basic Metabolic Panel:  Recent Labs Lab 08/05/14 2040 08/11/14 2350  NA 138 139  K 4.3 3.7  CL 94* 94*  CO2 36* 34*  GLUCOSE 135* 200*  BUN 22 29*  CREATININE 1.20* 1.30*  CALCIUM 9.3 9.7   Liver Function Tests:  Recent Labs Lab 08/11/14 2350  AST 63*  ALT 30  ALKPHOS 89  BILITOT 0.4  PROT 8.0  ALBUMIN 3.8   No results for input(s): LIPASE, AMYLASE in the last 168 hours. No results for input(s): AMMONIA in the last 168 hours. CBC:  Recent Labs Lab 08/05/14 0710 08/05/14 1945 08/11/14 0745 08/11/14 2350  WBC 2.8* 9.9 3.7* 8.3  NEUTROABS 1.7 9.2* 2.5 4.2  HGB 7.7* 10.1* 8.9* 10.2*  HCT 25.1* 33.3* 29.9* 34.6*  MCV 93.7 93.5 94.3 95.3  PLT 120* 141* 196 282   Cardiac Enzymes:  Recent Labs Lab 08/11/14 2350  TROPONINI 0.03    BNP (last 3 results) No results for input(s): PROBNP in the last 8760 hours. CBG: No results for input(s): GLUCAP in the last 168 hours.  Radiological Exams on Admission: Dg Chest Portable 1 View  08/12/2014   CLINICAL  DATA:  81 year old female with respiratory distress. History of with fluid and pneumonia January 2016.  EXAM: PORTABLE CHEST - 1 VIEW  COMPARISON:  08/05/2014  FINDINGS: The patient's chin obscures the right mid and upper hemithorax. Lung volumes remain low. Bibasilar atelectasis again seen. There is a large hiatal hernia with increased air compared to prior. The heart size is normal. No large pleural effusion.  IMPRESSION: Bibasilar atelectasis. The patient's chin obscures the right mid and upper hemithorax, repeat imaging recommended. Large hiatal hernia with increased air component compared to prior exam.   Electronically Signed   By: Rubye Oaks M.D.   On: 08/12/2014 01:02    EKG: Independently reviewed.  Assessment/Plan Active Problems:   Convulsions/seizures   Hypothyroidism   COPD exacerbation   Acute on chronic respiratory failure with hypercapnia   Acute on chronic respiratory failure with hypoxia   1. COPD exacerbation causing acute on chronic respiratory failure with hypoxia and hypercapnea 1. Continue BIPAP 2. May try to wean this down to high flow [place] in AM if patient unable to tolerate BIPAP or wants to try and eat in AM. 3. Azithromycin for now 1. No obvious sign of PNA on initial CXR however patients head is in way and radiologist recommends repeat so have ordered repeat CXR. 4. Adult wheeze protocol 5. Solumedrol 2. Chronic pain - continue fentanyl patch and PRN norco 3. Hypothyroidism - continue synthroid 4. H/o seizures - continue keppra 5. Hyperglycemia - putting patient on low dose SSI AC/HS    Code Status: DNR/DNI confirmed with family, they understand that if respiratory function were to worsen, patient would likely not survive. Family Communication: Family at bedside Disposition Plan: Admit to inpatient   Time spent: 70 min  GARDNER, JARED M. [facility] Pager [phone]  If 7AM-7PM, please contact the day team taking care of the  patient [email or web address] Password [facility] 08/12/2014, 4:17 AM

## 2014-08-12 NOTE — ED Notes (Signed)
Dr. Elesa MassedWard aware of vitals. 2 NS boluses started.

## 2014-08-12 NOTE — Care Management Utilization Note (Signed)
UR completed 

## 2014-08-12 NOTE — ED Notes (Signed)
CRITICAL VALUE ALERT  Critical value received:  Blood Gas  Date of notification:  08/12/14  Time of notification:  0008  Critical value read back: yes  Nurse who received alert:  Juanita LasterAmanda Reganne Messerschmidt, RN  MD notified (1st page):  Ward  Time of first page:  0010  MD notified (2nd page):  Time of second page:  Responding MD:  Ward  Time MD responded:  0010

## 2014-08-13 LAB — BASIC METABOLIC PANEL
Anion gap: 7 (ref 5–15)
BUN: 32 mg/dL — AB (ref 6–23)
CALCIUM: 8.6 mg/dL (ref 8.4–10.5)
CHLORIDE: 96 mmol/L (ref 96–112)
CO2: 36 mmol/L — ABNORMAL HIGH (ref 19–32)
CREATININE: 1.12 mg/dL — AB (ref 0.50–1.10)
GFR calc Af Amer: 49 mL/min — ABNORMAL LOW (ref >90)
GFR calc non Af Amer: 42 mL/min — ABNORMAL LOW (ref >90)
GLUCOSE: 143 mg/dL — AB (ref 70–99)
POTASSIUM: 4.2 mmol/L (ref 3.5–5.1)
Sodium: 139 mmol/L (ref 135–145)

## 2014-08-13 LAB — CBC WITH DIFFERENTIAL/PLATELET
BASOS ABS: 0 10*3/uL (ref 0.0–0.1)
Basophils Relative: 0 % (ref 0–1)
EOS ABS: 0 10*3/uL (ref 0.0–0.7)
Eosinophils Relative: 0 % (ref 0–5)
HCT: 26.7 % — ABNORMAL LOW (ref 36.0–46.0)
Hemoglobin: 8.2 g/dL — ABNORMAL LOW (ref 12.0–15.0)
LYMPHS ABS: 0.4 10*3/uL — AB (ref 0.7–4.0)
Lymphocytes Relative: 5 % — ABNORMAL LOW (ref 12–46)
MCH: 28.3 pg (ref 26.0–34.0)
MCHC: 30.7 g/dL (ref 30.0–36.0)
MCV: 92.1 fL (ref 78.0–100.0)
MONOS PCT: 3 % (ref 3–12)
Monocytes Absolute: 0.2 10*3/uL (ref 0.1–1.0)
NEUTROS PCT: 93 % — AB (ref 43–77)
Neutro Abs: 7.5 10*3/uL (ref 1.7–7.7)
PLATELETS: 192 10*3/uL (ref 150–400)
RBC: 2.9 MIL/uL — ABNORMAL LOW (ref 3.87–5.11)
RDW: 12.5 % (ref 11.5–15.5)
WBC: 8.1 10*3/uL (ref 4.0–10.5)

## 2014-08-13 LAB — GLUCOSE, CAPILLARY
Glucose-Capillary: 112 mg/dL — ABNORMAL HIGH (ref 70–99)
Glucose-Capillary: 144 mg/dL — ABNORMAL HIGH (ref 70–99)
Glucose-Capillary: 109 mg/dL — ABNORMAL HIGH (ref 70–99)

## 2014-08-13 MED ORDER — ZOLPIDEM TARTRATE 5 MG PO TABS
5.0000 mg | ORAL_TABLET | Freq: Every evening | ORAL | Status: DC | PRN
  Administered 2014-08-13 – 2014-08-14 (×2): 5 mg via ORAL
  Filled 2014-08-13 (×2): qty 1

## 2014-08-13 MED ORDER — NYSTATIN 100000 UNIT/GM EX CREA
TOPICAL_CREAM | CUTANEOUS | Status: AC
  Filled 2014-08-13: qty 15

## 2014-08-13 MED ORDER — CETYLPYRIDINIUM CHLORIDE 0.05 % MT LIQD
7.0000 mL | Freq: Two times a day (BID) | OROMUCOSAL | Status: DC
  Administered 2014-08-13 – 2014-08-15 (×5): 7 mL via OROMUCOSAL

## 2014-08-13 NOTE — Progress Notes (Signed)
Started patient on incentive , Patient cough up white clear sputum.

## 2014-08-13 NOTE — Progress Notes (Signed)
Pt transferring to unit 300 today. Pt/family is aware and agreeable to transfer. Assessment is unchanged from this morning and receiving RN has been given report. Belongings sent with pt at bedside.  

## 2014-08-13 NOTE — Progress Notes (Signed)
Subjective: She says she feels much better. She has no new complaints. He was able to sleep last night and she ate some as well. She still has some elevation of her lactate yesterday but clinically looks much better  Objective: Vital signs in last 24 hours: Temp:  [97.7 F (36.5 C)-99 F (37.2 C)] 98 F (36.7 C) (03/26 0500) Pulse Rate:  [75-102] 92 (03/26 0500) Resp:  [12-31] 17 (03/26 0500) BP: (103-140)/(41-85) 110/52 mmHg (03/26 0500) SpO2:  [97 %-100 %] 98 % (03/26 0500) FiO2 (%):  [35 %] 35 % (03/25 0751) Weight:  [40.9 kg (90 lb 2.7 oz)] 40.9 kg (90 lb 2.7 oz) (03/26 0500) Weight change: -1.5 kg (-3 lb 4.9 oz) Last BM Date:  (unknown)  Intake/Output from previous day: 03/25 0701 - 03/26 0700 In: 50 [IV Piggyback:50] Out: 1050 [Urine:1050]  PHYSICAL EXAM General appearance: alert, cooperative and no distress Resp: Still some rales on the right Cardio: regular rate and rhythm, S1, S2 normal, no murmur, click, rub or gallop GI: soft, non-tender; bowel sounds normal; no masses,  no organomegaly Extremities: extremities normal, atraumatic, no cyanosis or edema  Lab Results:  Results for orders placed or performed during the hospital encounter of 08/11/14 (from the past 48 hour(s))  CBC with Differential     Status: Abnormal   Collection Time: 08/11/14 11:50 PM  Result Value Ref Range   WBC 8.3 4.0 - 10.5 K/uL   RBC 3.63 (L) 3.87 - 5.11 MIL/uL   Hemoglobin 10.2 (L) 12.0 - 15.0 g/dL   HCT 34.6 (L) 36.0 - 46.0 %   MCV 95.3 78.0 - 100.0 fL   MCH 28.1 26.0 - 34.0 pg   MCHC 29.5 (L) 30.0 - 36.0 g/dL   RDW 12.6 11.5 - 15.5 %   Platelets 282 150 - 400 K/uL    Comment: DELTA CHECK NOTED   Neutrophils Relative % 50 43 - 77 %   Neutro Abs 4.2 1.7 - 7.7 K/uL   Lymphocytes Relative 36 12 - 46 %   Lymphs Abs 3.0 0.7 - 4.0 K/uL   Monocytes Relative 6 3 - 12 %   Monocytes Absolute 0.5 0.1 - 1.0 K/uL   Eosinophils Relative 7 (H) 0 - 5 %   Eosinophils Absolute 0.6 0.0 - 0.7 K/uL    Basophils Relative 1 0 - 1 %   Basophils Absolute 0.1 0.0 - 0.1 K/uL  Comprehensive metabolic panel     Status: Abnormal   Collection Time: 08/11/14 11:50 PM  Result Value Ref Range   Sodium 139 135 - 145 mmol/L   Potassium 3.7 3.5 - 5.1 mmol/L   Chloride 94 (L) 96 - 112 mmol/L   CO2 34 (H) 19 - 32 mmol/L   Glucose, Bld 200 (H) 70 - 99 mg/dL   BUN 29 (H) 6 - 23 mg/dL   Creatinine, Ser 1.30 (H) 0.50 - 1.10 mg/dL   Calcium 9.7 8.4 - 10.5 mg/dL   Total Protein 8.0 6.0 - 8.3 g/dL   Albumin 3.8 3.5 - 5.2 g/dL   AST 63 (H) 0 - 37 U/L   ALT 30 0 - 35 U/L   Alkaline Phosphatase 89 39 - 117 U/L   Total Bilirubin 0.4 0.3 - 1.2 mg/dL   GFR calc non Af Amer 35 (L) >90 mL/min   GFR calc Af Amer 41 (L) >90 mL/min    Comment: (NOTE) The eGFR has been calculated using the CKD EPI equation. This calculation has not been  validated in all clinical situations. eGFR's persistently <90 mL/min signify possible Chronic Kidney Disease.    Anion gap 11 5 - 15  Brain natriuretic peptide     Status: Abnormal   Collection Time: 08/11/14 11:50 PM  Result Value Ref Range   B Natriuretic Peptide 212.0 (H) 0.0 - 100.0 pg/mL  Troponin I     Status: None   Collection Time: 08/11/14 11:50 PM  Result Value Ref Range   Troponin I 0.03 <0.031 ng/mL    Comment:        NO INDICATION OF MYOCARDIAL INJURY.   Blood culture (routine x 2)     Status: None (Preliminary result)   Collection Time: 08/11/14 11:50 PM  Result Value Ref Range   Specimen Description BLOOD LEFT ARM    Special Requests BOTTLES DRAWN AEROBIC AND ANAEROBIC 4CC EACH    Culture PENDING    Report Status PENDING   Blood gas, arterial ([facility] & [facility] ONLY)     Status: Abnormal   Collection Time: 08/12/14 12:01 AM  Result Value Ref Range   FIO2 100.00 %   Delivery systems BILEVEL POSITIVE AIRWAY PRESSURE    Inspiratory PAP 14    Expiratory PAP 6    pH, Arterial 7.223 (L) 7.350 - 7.450   pCO2 arterial 86.9 (HH) 35.0 - 45.0 mmHg    Comment:  CRITICAL RESULT CALLED TO, READ BACK BY AND VERIFIED WITH:  AMANDA KELLY RN BY K KNICK RRT RCP ON 08/12/2014 AT 0008    pO2, Arterial 211.0 (H) 80.0 - 100.0 mmHg   Bicarbonate 34.5 (H) 20.0 - 24.0 mEq/L   TCO2 33.5 0 - 100 mmol/L   Acid-Base Excess 7.1 (H) 0.0 - 2.0 mmol/L   O2 Saturation 98.8 %   Patient temperature 37.0    Collection site LEFT RADIAL    Drawn by [id number]    Sample type ARTERIAL    Allens test (pass/fail) PASS PASS  Lactic acid, plasma     Status: Abnormal   Collection Time: 08/12/14 12:20 AM  Result Value Ref Range   Lactic Acid, Venous 2.9 (HH) 0.5 - 2.0 mmol/L    Comment: RESULT REPEATED AND VERIFIED CRITICAL RESULT CALLED TO, READ BACK BY AND VERIFIED WITH: KELLY A 0143 ON [id number] BY FORSYTH K   Blood culture (routine x 2)     Status: None (Preliminary result)   Collection Time: 08/12/14 12:20 AM  Result Value Ref Range   Specimen Description BLOOD LEFT HAND    Special Requests      BOTTLES DRAWN AEROBIC AND ANAEROBIC AEB 5CC ANA 4CC   Culture PENDING    Report Status PENDING   Urinalysis, Routine w reflex microscopic     Status: Abnormal   Collection Time: 08/12/14  1:18 AM  Result Value Ref Range   Color, Urine YELLOW YELLOW   APPearance CLEAR CLEAR   Specific Gravity, Urine 1.020 1.005 - 1.030   pH 7.5 5.0 - 8.0   Glucose, UA NEGATIVE NEGATIVE mg/dL   Hgb urine dipstick TRACE (A) NEGATIVE   Bilirubin Urine NEGATIVE NEGATIVE   Ketones, ur NEGATIVE NEGATIVE mg/dL   Protein, ur 30 (A) NEGATIVE mg/dL   Urobilinogen, UA 0.2 0.0 - 1.0 mg/dL   Nitrite NEGATIVE NEGATIVE   Leukocytes, UA NEGATIVE NEGATIVE  Urine microscopic-add on     Status: Abnormal   Collection Time: 08/12/14  1:18 AM  Result Value Ref Range   Squamous Epithelial / LPF FEW (A) RARE   WBC, UA  0-2 <3 WBC/hpf   RBC / HPF 0-2 <3 RBC/hpf   Bacteria, UA MANY (A) RARE  Lactic acid, plasma     Status: Abnormal   Collection Time: 08/12/14  2:40 AM  Result Value Ref Range   Lactic Acid,  Venous 2.9 (HH) 0.5 - 2.0 mmol/L    Comment: RESULT REPEATED AND VERIFIED CRITICAL VALUE NOTED.  VALUE IS CONSISTENT WITH PREVIOUSLY REPORTED AND CALLED VALUE.   MRSA PCR Screening     Status: None   Collection Time: 08/12/14  7:01 AM  Result Value Ref Range   MRSA by PCR NEGATIVE NEGATIVE    Comment:        The GeneXpert MRSA Assay (FDA approved for NASAL specimens only), is one component of a comprehensive MRSA colonization surveillance program. It is not intended to diagnose MRSA infection nor to guide or monitor treatment for MRSA infections.   Glucose, capillary     Status: Abnormal   Collection Time: 08/12/14  7:15 AM  Result Value Ref Range   Glucose-Capillary 144 (H) 70 - 99 mg/dL   Comment 1 Notify RN    Comment 2 Document in Chart   Lactic acid, plasma     Status: Abnormal   Collection Time: 08/12/14 10:22 AM  Result Value Ref Range   Lactic Acid, Venous 4.3 (HH) 0.5 - 2.0 mmol/L    Comment: CRITICAL RESULT CALLED TO, READ BACK BY AND VERIFIED WITH: GRAY,M AT 1116 ON 08/12/14 BY WOODS,M   Glucose, capillary     Status: Abnormal   Collection Time: 08/12/14 11:04 AM  Result Value Ref Range   Glucose-Capillary 132 (H) 70 - 99 mg/dL   Comment 1 Notify RN    Comment 2 Document in Chart   Lactic acid, plasma     Status: Abnormal   Collection Time: 08/12/14  1:27 PM  Result Value Ref Range   Lactic Acid, Venous 3.7 (HH) 0.5 - 2.0 mmol/L    Comment: CRITICAL RESULT CALLED TO, READ BACK BY AND VERIFIED WITH: GRAY,M AT 1418 ON 08/12/14 BY WOODS,M    Glucose, capillary     Status: Abnormal   Collection Time: 08/12/14  4:01 PM  Result Value Ref Range   Glucose-Capillary 136 (H) 70 - 99 mg/dL   Comment 1 Notify RN   Glucose, capillary     Status: Abnormal   Collection Time: 08/12/14  8:15 PM  Result Value Ref Range   Glucose-Capillary 138 (H) 70 - 99 mg/dL   Comment 1 Notify RN   CBC with Differential/Platelet     Status: Abnormal   Collection Time: 08/13/14  4:25  AM  Result Value Ref Range   WBC 8.1 4.0 - 10.5 K/uL   RBC 2.90 (L) 3.87 - 5.11 MIL/uL   Hemoglobin 8.2 (L) 12.0 - 15.0 g/dL    Comment: DELTA CHECK NOTED   HCT 26.7 (L) 36.0 - 46.0 %   MCV 92.1 78.0 - 100.0 fL   MCH 28.3 26.0 - 34.0 pg   MCHC 30.7 30.0 - 36.0 g/dL   RDW 12.5 11.5 - 15.5 %   Platelets 192 150 - 400 K/uL    Comment: DELTA CHECK NOTED   Neutrophils Relative % 93 (H) 43 - 77 %   Neutro Abs 7.5 1.7 - 7.7 K/uL   Lymphocytes Relative 5 (L) 12 - 46 %   Lymphs Abs 0.4 (L) 0.7 - 4.0 K/uL   Monocytes Relative 3 3 - 12 %   Monocytes Absolute 0.2  0.1 - 1.0 K/uL   Eosinophils Relative 0 0 - 5 %   Eosinophils Absolute 0.0 0.0 - 0.7 K/uL   Basophils Relative 0 0 - 1 %   Basophils Absolute 0.0 0.0 - 0.1 K/uL  Basic metabolic panel     Status: Abnormal   Collection Time: 08/13/14  4:25 AM  Result Value Ref Range   Sodium 139 135 - 145 mmol/L   Potassium 4.2 3.5 - 5.1 mmol/L   Chloride 96 96 - 112 mmol/L   CO2 36 (H) 19 - 32 mmol/L   Glucose, Bld 143 (H) 70 - 99 mg/dL   BUN 32 (H) 6 - 23 mg/dL   Creatinine, Ser 1.12 (H) 0.50 - 1.10 mg/dL   Calcium 8.6 8.4 - 10.5 mg/dL   GFR calc non Af Amer 42 (L) >90 mL/min   GFR calc Af Amer 49 (L) >90 mL/min    Comment: (NOTE) The eGFR has been calculated using the CKD EPI equation. This calculation has not been validated in all clinical situations. eGFR's persistently <90 mL/min signify possible Chronic Kidney Disease.    Anion gap 7 5 - 15    ABGS  Recent Labs  08/12/14 0001  PHART 7.223*  PO2ART 211.0*  TCO2 33.5  HCO3 34.5*   CULTURES Recent Results (from the past 240 hour(s))  Urine culture     Status: None   Collection Time: 08/05/14  7:45 PM  Result Value Ref Range Status   Specimen Description URINE, CATHETERIZED  Final   Special Requests NONE  Final   Colony Count   Final    >=100,000 COLONIES/ML Performed at [facility]    Culture   Final    PROTEUS MIRABILIS Performed at [facility]     Report Status 08/09/2014 FINAL  Final   Organism ID, Bacteria PROTEUS MIRABILIS  Final      Susceptibility   Proteus mirabilis - MIC*    AMPICILLIN <=2 SENSITIVE Sensitive     CEFAZOLIN 8 SENSITIVE Sensitive     CEFTRIAXONE <=1 SENSITIVE Sensitive     CIPROFLOXACIN >=4 RESISTANT Resistant     GENTAMICIN 8 INTERMEDIATE Intermediate     LEVOFLOXACIN >=8 RESISTANT Resistant     NITROFURANTOIN 256 RESISTANT Resistant     TOBRAMYCIN 4 SENSITIVE Sensitive     TRIMETH/SULFA >=320 RESISTANT Resistant     PIP/TAZO <=4 SENSITIVE Sensitive     * PROTEUS MIRABILIS  Blood culture (routine x 2)     Status: None (Preliminary result)   Collection Time: 08/11/14 11:50 PM  Result Value Ref Range Status   Specimen Description BLOOD LEFT ARM  Final   Special Requests BOTTLES DRAWN AEROBIC AND ANAEROBIC 4CC EACH  Final   Culture PENDING  Incomplete   Report Status PENDING  Incomplete  Blood culture (routine x 2)     Status: None (Preliminary result)   Collection Time: 08/12/14 12:20 AM  Result Value Ref Range Status   Specimen Description BLOOD LEFT HAND  Final   Special Requests   Final    BOTTLES DRAWN AEROBIC AND ANAEROBIC AEB 5CC ANA 4CC   Culture PENDING  Incomplete   Report Status PENDING  Incomplete  MRSA PCR Screening     Status: None   Collection Time: 08/12/14  7:01 AM  Result Value Ref Range Status   MRSA by PCR NEGATIVE NEGATIVE Final    Comment:        The GeneXpert MRSA Assay (FDA approved for NASAL specimens  only), is one component of a comprehensive MRSA colonization surveillance program. It is not intended to diagnose MRSA infection nor to guide or monitor treatment for MRSA infections.    Studies/Results: Dg Chest 1v Repeat Same Day  08/12/2014   CLINICAL DATA:  Acute respiratory failure with hypercapnia  EXAM: CHEST - 1 VIEW SAME DAY  COMPARISON:  08/12/2014  FINDINGS: Streaky right base opacity, similar to previous.  No appreciable change in cardiomegaly or aortic  tortuosity. Large hiatal hernia is again noted. No edema, effusion, or pneumothorax.  IMPRESSION: Right basilar atelectasis or pneumonia, unchanged from earlier today.   Electronically Signed   By: Monte Fantasia M.D.   On: 08/12/2014 07:52   Dg Chest Portable 1 View  08/12/2014   CLINICAL DATA:  80 year old female with respiratory distress. History of with fluid and pneumonia January 2016.  EXAM: PORTABLE CHEST - 1 VIEW  COMPARISON:  08/05/2014  FINDINGS: The patient's chin obscures the right mid and upper hemithorax. Lung volumes remain low. Bibasilar atelectasis again seen. There is a large hiatal hernia with increased air compared to prior. The heart size is normal. No large pleural effusion.  IMPRESSION: Bibasilar atelectasis. The patient's chin obscures the right mid and upper hemithorax, repeat imaging recommended. Large hiatal hernia with increased air component compared to prior exam.   Electronically Signed   By: Jeb Levering M.D.   On: 08/12/2014 01:02    Medications:  Prior to Admission:  Prescriptions prior to admission  Medication Sig Dispense Refill Last Dose  . alum & mag hydroxide-simeth (MYLANTA) 200-200-20 MG/5ML suspension Take 30 mLs by mouth every 4 (four) hours as needed for indigestion or heartburn.   Past Month at Unknown time  . Amino Acids-Protein Hydrolys (FEEDING SUPPLEMENT, PRO-STAT SUGAR FREE 64,) LIQD Take 30 mLs by mouth 2 (two) times daily.   08/11/2014 at Unknown time  . calcium-vitamin D (OSCAL WITH D) 500-200 MG-UNIT per tablet Take 1 tablet by mouth 3 (three) times daily. For osteoporosis   08/11/2014 at Unknown time  . carboxymethylcellulose (REFRESH PLUS) 0.5 % SOLN Place 1 drop into the right eye every 6 (six) hours as needed. Dry eyes   Past Month at Unknown time  . docusate sodium (COLACE) 100 MG capsule Take 100 mg by mouth 2 (two) times daily. For constipation   08/11/2014 at Unknown time  . ENSURE PLUS (ENSURE PLUS) LIQD Take 237 mLs by mouth 2 (two)  times daily between meals. Nutritional supplement   08/11/2014 at Unknown time  . escitalopram (LEXAPRO) 10 MG tablet Take 10 mg by mouth daily. For depression   08/11/2014 at Unknown time  . fentaNYL (DURAGESIC - DOSED MCG/HR) 50 MCG/HR Place 50 mcg onto the skin every other day. For pain   08/10/2014  . furosemide (LASIX) 20 MG tablet Take 20 mg by mouth daily. For HTN/ edema   08/11/2014 at Unknown time  . HYDROcodone-acetaminophen (NORCO/VICODIN) 5-325 MG per tablet Take one tablet by mouth three times daily and every 6 hours as needed for pain 210 tablet 0 08/11/2014 at Unknown time  . iron polysaccharides (NIFEREX) 150 MG capsule Take 150 mg by mouth daily.   08/11/2014 at Unknown time  . levalbuterol (XOPENEX) 0.63 MG/3ML nebulizer solution Take 0.63 mg by nebulization every 4 (four) hours as needed for wheezing or shortness of breath.   unknown  . levETIRAcetam (KEPPRA) 250 MG tablet Take 250 mg by mouth every 12 (twelve) hours. For seizures   08/11/2014 at Unknown time  .  levothyroxine (SYNTHROID, LEVOTHROID) 175 MCG tablet Take 175 mcg by mouth daily. For thyroid therapy   08/11/2014 at Unknown time  . loratadine (CLARITIN) 10 MG tablet Take 10 mg by mouth daily. For allergies   08/11/2014 at Unknown time  . magnesium hydroxide (MILK OF MAGNESIA) 400 MG/5ML suspension Take 30 mLs by mouth daily as needed for mild constipation.   Past Month at Unknown time  . metoprolol tartrate (LOPRESSOR) 25 MG tablet Take 25 mg by mouth daily. For HTN   08/11/2014 at 0800  . Multiple Vitamin (MULTIVITAMIN WITH MINERALS) TABS Take 1 tablet by mouth daily. For nutritional supplement   08/11/2014 at Unknown time  . phenazopyridine (PYRIDIUM) 95 MG tablet Take 95 mg by mouth 2 (two) times daily.   Past Week at Unknown time  . phenylephrine-shark liver oil-mineral oil-petrolatum (PREPARATION H) 0.25-3-14-71.9 % rectal ointment Place 1 application rectally as needed for hemorrhoids.   unknown  . ranitidine (ZANTAC) 150 MG  tablet Take 150 mg by mouth at bedtime.   08/11/2014 at Unknown time  . rOPINIRole (REQUIP) 4 MG tablet Take 4 mg by mouth at bedtime. For parkinson disease   08/11/2014 at Unknown time  . sennosides-docusate sodium (SENOKOT-S) 8.6-50 MG tablet Take 1 tablet by mouth 2 (two) times daily. For constipation   08/11/2014 at Unknown time  . zolpidem (AMBIEN) 5 MG tablet Take 5 mg by mouth at bedtime as needed for sleep. For insomnia   Past Month at Unknown time   Scheduled: . antiseptic oral rinse  7 mL Mouth Rinse q12n4p  . aztreonam  1 g Intravenous Q8H  . chlorhexidine  15 mL Mouth Rinse BID  . collagenase   Topical Daily  . docusate sodium  100 mg Oral BID  . escitalopram  10 mg Oral Daily  . feeding supplement (ENSURE ENLIVE)  237 mL Oral BID BM  . fentaNYL  50 mcg Transdermal Q48H  . furosemide  20 mg Oral Daily  . heparin  5,000 Units Subcutaneous 3 times per day  . insulin aspart  0-9 Units Subcutaneous TID WC  . ipratropium-albuterol  3 mL Nebulization Q4H  . iron polysaccharides  150 mg Oral Daily  . levETIRAcetam  250 mg Oral Q12H  . levothyroxine  175 mcg Oral QAC breakfast  . loratadine  10 mg Oral Daily  . methylPREDNISolone (SOLU-MEDROL) injection  60 mg Intravenous Q12H  . metoprolol tartrate  25 mg Oral Daily  . nystatin cream  1 application Topical BID  . pantoprazole  40 mg Oral Daily  . rOPINIRole  4 mg Oral QHS  . senna-docusate  1 tablet Oral BID  . vancomycin  500 mg Intravenous Q24H   Continuous:  RCB:[id number], HYDROcodone-acetaminophen, levalbuterol, magnesium hydroxide  Assesment: She has healthcare associated pneumonia and because of that acute on chronic respiratory failure. She is overall much better. She has a wound on her right ankle and there is some concern that this is osteomyelitis. This is being treated. She has a history of seizures but none recently. She has had severe osteoporosis with multiple fractures Active Problems:   Convulsions/seizures    Hypothyroidism   COPD exacerbation   Acute on chronic respiratory failure with hypercapnia   Acute on chronic respiratory failure with hypoxia    Plan: I think she can transfer from ICU    LOS: 1 day   Cinch Ormond L 08/13/2014, 7:21 AM

## 2014-08-13 NOTE — Progress Notes (Signed)
Patient is requesting something for sleep, paged the on-call MD covering for Dr. Juanetta GoslingHawkins, will follow orders given.

## 2014-08-13 NOTE — Progress Notes (Addendum)
Foley removed per protocol. Pt DTV by 1445. Will continue to monitor.   1330: pt voided

## 2014-08-14 LAB — GLUCOSE, CAPILLARY
GLUCOSE-CAPILLARY: 111 mg/dL — AB (ref 70–99)
GLUCOSE-CAPILLARY: 103 mg/dL — AB (ref 70–99)
Glucose-Capillary: 140 mg/dL — ABNORMAL HIGH (ref 70–99)
Glucose-Capillary: 114 mg/dL — ABNORMAL HIGH (ref 70–99)
Glucose-Capillary: 95 mg/dL (ref 70–99)

## 2014-08-14 NOTE — Progress Notes (Signed)
Subjective: She feels better. She has no new complaints. She hopes for discharge soon  Objective: Vital signs in last 24 hours: Temp:  [97.5 F (36.4 C)-98.6 F (37 C)] 97.5 F (36.4 C) (03/27 0639) Pulse Rate:  [65-97] 80 (03/27 0639) Resp:  [17-23] 20 (03/27 0639) BP: (98-128)/(42-91) 105/78 mmHg (03/27 0639) SpO2:  [95 %-100 %] 99 % (03/27 0729) Weight change:  Last BM Date: 08/12/14  Intake/Output from previous day: 03/26 0701 - 03/27 0700 In: 440 [P.O.:240; IV Piggyback:200] Out: -   PHYSICAL EXAM General appearance: alert, cooperative and no distress Resp: She has rales in the right base Cardio: regular rate and rhythm, S1, S2 normal, no murmur, click, rub or gallop GI: soft, non-tender; bowel sounds normal; no masses,  no organomegaly Extremities: extremities normal, atraumatic, no cyanosis or edema  Lab Results:  Results for orders placed or performed during the hospital encounter of 08/11/14 (from the past 48 hour(s))  Lactic acid, plasma     Status: Abnormal   Collection Time: 08/12/14 10:22 AM  Result Value Ref Range   Lactic Acid, Venous 4.3 (HH) 0.5 - 2.0 mmol/L    Comment: CRITICAL RESULT CALLED TO, READ BACK BY AND VERIFIED WITH: GRAY,M AT 1116 ON 08/12/14 BY WOODS,M   Glucose, capillary     Status: Abnormal   Collection Time: 08/12/14 11:04 AM  Result Value Ref Range   Glucose-Capillary 132 (H) 70 - 99 mg/dL   Comment 1 Notify RN    Comment 2 Document in Chart   Lactic acid, plasma     Status: Abnormal   Collection Time: 08/12/14  1:27 PM  Result Value Ref Range   Lactic Acid, Venous 3.7 (HH) 0.5 - 2.0 mmol/L    Comment: CRITICAL RESULT CALLED TO, READ BACK BY AND VERIFIED WITH: GRAY,M AT 1418 ON 08/12/14 BY WOODS,M    Glucose, capillary     Status: Abnormal   Collection Time: 08/12/14  4:01 PM  Result Value Ref Range   Glucose-Capillary 136 (H) 70 - 99 mg/dL   Comment 1 Notify RN   Glucose, capillary     Status: Abnormal   Collection Time:  08/12/14  8:15 PM  Result Value Ref Range   Glucose-Capillary 138 (H) 70 - 99 mg/dL   Comment 1 Notify RN   CBC with Differential/Platelet     Status: Abnormal   Collection Time: 08/13/14  4:25 AM  Result Value Ref Range   WBC 8.1 4.0 - 10.5 K/uL   RBC 2.90 (L) 3.87 - 5.11 MIL/uL   Hemoglobin 8.2 (L) 12.0 - 15.0 g/dL    Comment: DELTA CHECK NOTED   HCT 26.7 (L) 36.0 - 46.0 %   MCV 92.1 78.0 - 100.0 fL   MCH 28.3 26.0 - 34.0 pg   MCHC 30.7 30.0 - 36.0 g/dL   RDW 12.5 11.5 - 15.5 %   Platelets 192 150 - 400 K/uL    Comment: DELTA CHECK NOTED   Neutrophils Relative % 93 (H) 43 - 77 %   Neutro Abs 7.5 1.7 - 7.7 K/uL   Lymphocytes Relative 5 (L) 12 - 46 %   Lymphs Abs 0.4 (L) 0.7 - 4.0 K/uL   Monocytes Relative 3 3 - 12 %   Monocytes Absolute 0.2 0.1 - 1.0 K/uL   Eosinophils Relative 0 0 - 5 %   Eosinophils Absolute 0.0 0.0 - 0.7 K/uL   Basophils Relative 0 0 - 1 %   Basophils Absolute 0.0 0.0 -  0.1 K/uL  Basic metabolic panel     Status: Abnormal   Collection Time: 08/13/14  4:25 AM  Result Value Ref Range   Sodium 139 135 - 145 mmol/L   Potassium 4.2 3.5 - 5.1 mmol/L   Chloride 96 96 - 112 mmol/L   CO2 36 (H) 19 - 32 mmol/L   Glucose, Bld 143 (H) 70 - 99 mg/dL   BUN 32 (H) 6 - 23 mg/dL   Creatinine, Ser 1.12 (H) 0.50 - 1.10 mg/dL   Calcium 8.6 8.4 - 10.5 mg/dL   GFR calc non Af Amer 42 (L) >90 mL/min   GFR calc Af Amer 49 (L) >90 mL/min    Comment: (NOTE) The eGFR has been calculated using the CKD EPI equation. This calculation has not been validated in all clinical situations. eGFR's persistently <90 mL/min signify possible Chronic Kidney Disease.    Anion gap 7 5 - 15  Glucose, capillary     Status: Abnormal   Collection Time: 08/13/14  7:55 AM  Result Value Ref Range   Glucose-Capillary 112 (H) 70 - 99 mg/dL  Glucose, capillary     Status: Abnormal   Collection Time: 08/13/14 11:22 AM  Result Value Ref Range   Glucose-Capillary 144 (H) 70 - 99 mg/dL  Glucose,  capillary     Status: Abnormal   Collection Time: 08/13/14  4:33 PM  Result Value Ref Range   Glucose-Capillary 109 (H) 70 - 99 mg/dL   Comment 1 Notify RN   Glucose, capillary     Status: Abnormal   Collection Time: 08/13/14 10:15 PM  Result Value Ref Range   Glucose-Capillary 140 (H) 70 - 99 mg/dL   Comment 1 Notify RN    Comment 2 Document in Chart   Glucose, capillary     Status: Abnormal   Collection Time: 08/14/14  8:00 AM  Result Value Ref Range   Glucose-Capillary 114 (H) 70 - 99 mg/dL    ABGS  Recent Labs  08/12/14 0001  PHART 7.223*  PO2ART 211.0*  TCO2 33.5  HCO3 34.5*   CULTURES Recent Results (from the past 240 hour(s))  Urine culture     Status: None   Collection Time: 08/05/14  7:45 PM  Result Value Ref Range Status   Specimen Description URINE, CATHETERIZED  Final   Special Requests NONE  Final   Colony Count   Final    >=100,000 COLONIES/ML Performed at [place] Performed at [facility]    Report Status 08/09/2014 FINAL  Final   Organism ID, Bacteria PROTEUS MIRABILIS  Final      Susceptibility   Proteus mirabilis - MIC*    AMPICILLIN <=2 SENSITIVE Sensitive     CEFAZOLIN 8 SENSITIVE Sensitive     CEFTRIAXONE <=1 SENSITIVE Sensitive     CIPROFLOXACIN >=4 RESISTANT Resistant     GENTAMICIN 8 INTERMEDIATE Intermediate     LEVOFLOXACIN >=8 RESISTANT Resistant     NITROFURANTOIN 256 RESISTANT Resistant     TOBRAMYCIN 4 SENSITIVE Sensitive     TRIMETH/SULFA >=320 RESISTANT Resistant     PIP/TAZO <=4 SENSITIVE Sensitive     * PROTEUS MIRABILIS  Blood culture (routine x 2)     Status: None (Preliminary result)   Collection Time: 08/11/14 11:50 PM  Result Value Ref Range Status   Specimen Description BLOOD LEFT ARM  Final   Special Requests BOTTLES DRAWN AEROBIC AND ANAEROBIC  4CC EACH  Final   Culture NO GROWTH 1 DAY  Final   Report Status PENDING  Incomplete  Blood culture (routine x  2)     Status: None (Preliminary result)   Collection Time: 08/12/14 12:20 AM  Result Value Ref Range Status   Specimen Description BLOOD LEFT HAND  Final   Special Requests   Final    BOTTLES DRAWN AEROBIC AND ANAEROBIC AEB 5CC ANA 4CC   Culture NO GROWTH 1 DAY  Final   Report Status PENDING  Incomplete  MRSA PCR Screening     Status: None   Collection Time: 08/12/14  7:01 AM  Result Value Ref Range Status   MRSA by PCR NEGATIVE NEGATIVE Final    Comment:        The GeneXpert MRSA Assay (FDA approved for NASAL specimens only), is one component of a comprehensive MRSA colonization surveillance program. It is not intended to diagnose MRSA infection nor to guide or monitor treatment for MRSA infections.    Studies/Results: No results found.  Medications:  Prior to Admission:  Prescriptions prior to admission  Medication Sig Dispense Refill Last Dose  . alum & mag hydroxide-simeth (MYLANTA) 200-200-20 MG/5ML suspension Take 30 mLs by mouth every 4 (four) hours as needed for indigestion or heartburn.   Past Month at Unknown time  . Amino Acids-Protein Hydrolys (FEEDING SUPPLEMENT, PRO-STAT SUGAR FREE 64,) LIQD Take 30 mLs by mouth 2 (two) times daily.   08/11/2014 at Unknown time  . calcium-vitamin D (OSCAL WITH D) 500-200 MG-UNIT per tablet Take 1 tablet by mouth 3 (three) times daily. For osteoporosis   08/11/2014 at Unknown time  . carboxymethylcellulose (REFRESH PLUS) 0.5 % SOLN Place 1 drop into the right eye every 6 (six) hours as needed. Dry eyes   Past Month at Unknown time  . docusate sodium (COLACE) 100 MG capsule Take 100 mg by mouth 2 (two) times daily. For constipation   08/11/2014 at Unknown time  . ENSURE PLUS (ENSURE PLUS) LIQD Take 237 mLs by mouth 2 (two) times daily between meals. Nutritional supplement   08/11/2014 at Unknown time  . escitalopram (LEXAPRO) 10 MG tablet Take 10 mg by mouth daily. For depression   08/11/2014 at Unknown time  . fentaNYL (DURAGESIC -  DOSED MCG/HR) 50 MCG/HR Place 50 mcg onto the skin every other day. For pain   08/10/2014  . furosemide (LASIX) 20 MG tablet Take 20 mg by mouth daily. For HTN/ edema   08/11/2014 at Unknown time  . HYDROcodone-acetaminophen (NORCO/VICODIN) 5-325 MG per tablet Take one tablet by mouth three times daily and every 6 hours as needed for pain 210 tablet 0 08/11/2014 at Unknown time  . iron polysaccharides (NIFEREX) 150 MG capsule Take 150 mg by mouth daily.   08/11/2014 at Unknown time  . levalbuterol (XOPENEX) 0.63 MG/3ML nebulizer solution Take 0.63 mg by nebulization every 4 (four) hours as needed for wheezing or shortness of breath.   unknown  . levETIRAcetam (KEPPRA) 250 MG tablet Take 250 mg by mouth every 12 (twelve) hours. For seizures   08/11/2014 at Unknown time  . levothyroxine (SYNTHROID, LEVOTHROID) 175 MCG tablet Take 175 mcg by mouth daily. For thyroid therapy   08/11/2014 at Unknown time  . loratadine (CLARITIN) 10 MG tablet Take 10 mg by mouth daily. For allergies   08/11/2014 at Unknown time  . magnesium hydroxide (MILK OF MAGNESIA) 400 MG/5ML suspension Take 30 mLs by mouth daily as needed for mild constipation.  Past Month at Unknown time  . metoprolol tartrate (LOPRESSOR) 25 MG tablet Take 25 mg by mouth daily. For HTN   08/11/2014 at 0800  . Multiple Vitamin (MULTIVITAMIN WITH MINERALS) TABS Take 1 tablet by mouth daily. For nutritional supplement   08/11/2014 at Unknown time  . phenazopyridine (PYRIDIUM) 95 MG tablet Take 95 mg by mouth 2 (two) times daily.   Past Week at Unknown time  . phenylephrine-shark liver oil-mineral oil-petrolatum (PREPARATION H) 0.25-3-14-71.9 % rectal ointment Place 1 application rectally as needed for hemorrhoids.   unknown  . ranitidine (ZANTAC) 150 MG tablet Take 150 mg by mouth at bedtime.   08/11/2014 at Unknown time  . rOPINIRole (REQUIP) 4 MG tablet Take 4 mg by mouth at bedtime. For parkinson disease   08/11/2014 at Unknown time  . sennosides-docusate  sodium (SENOKOT-S) 8.6-50 MG tablet Take 1 tablet by mouth 2 (two) times daily. For constipation   08/11/2014 at Unknown time  . zolpidem (AMBIEN) 5 MG tablet Take 5 mg by mouth at bedtime as needed for sleep. For insomnia   Past Month at Unknown time   Scheduled: . antiseptic oral rinse  7 mL Mouth Rinse BID  . aztreonam  1 g Intravenous Q8H  . collagenase   Topical Daily  . docusate sodium  100 mg Oral BID  . escitalopram  10 mg Oral Daily  . feeding supplement (ENSURE ENLIVE)  237 mL Oral BID BM  . fentaNYL  50 mcg Transdermal Q48H  . furosemide  20 mg Oral Daily  . heparin  5,000 Units Subcutaneous 3 times per day  . insulin aspart  0-9 Units Subcutaneous TID WC  . ipratropium-albuterol  3 mL Nebulization Q4H  . iron polysaccharides  150 mg Oral Daily  . levETIRAcetam  250 mg Oral Q12H  . levothyroxine  175 mcg Oral QAC breakfast  . loratadine  10 mg Oral Daily  . methylPREDNISolone (SOLU-MEDROL) injection  60 mg Intravenous Q12H  . metoprolol tartrate  25 mg Oral Daily  . nystatin cream  1 application Topical BID  . pantoprazole  40 mg Oral Daily  . rOPINIRole  4 mg Oral QHS  . senna-docusate  1 tablet Oral BID  . vancomycin  500 mg Intravenous Q24H   Continuous:  ZOX:[id number], HYDROcodone-acetaminophen, levalbuterol, magnesium hydroxide, zolpidem  Assesment: She was admitted with healthcare associated pneumonia. She has acute on chronic respiratory failure which is much improved. Overall she is better. Active Problems:   Convulsions/seizures   Hypothyroidism   COPD exacerbation   Acute on chronic respiratory failure with hypercapnia   Acute on chronic respiratory failure with hypoxia    Plan: I think she maybe only be transferred back to her skilled care facility tomorrow    LOS: 2 days   [name] L 08/14/2014, 8:52 AM

## 2014-08-14 NOTE — Progress Notes (Signed)
Drsg changed to ulcer on patient's RIGHT outer ankle this shift. Scant amount of yellowish drainage noted on removed drsg. Wound bed yellow and pink. Periwound intact, red and edematous. Sore and tender to the touch. Area cleansed with NS, patted dry, santyl and CDD applied. Patient tolerated well.

## 2014-08-15 ENCOUNTER — Inpatient Hospital Stay
Admission: RE | Admit: 2014-08-15 | Discharge: 2014-09-10 | Disposition: A | Discharge status: Other Acute Inpatient Hospital | Payer: Medicaid Other | Source: Ambulatory Visit | Attending: Internal Medicine | Admitting: Internal Medicine

## 2014-08-15 DIAGNOSIS — I83009 Varicose veins of unspecified lower extremity with ulcer of unspecified site: Secondary | ICD-10-CM | POA: Diagnosis present

## 2014-08-15 DIAGNOSIS — L97909 Non-pressure chronic ulcer of unspecified part of unspecified lower leg with unspecified severity: Secondary | ICD-10-CM

## 2014-08-15 LAB — BASIC METABOLIC PANEL
ANION GAP: 5 (ref 5–15)
BUN: 32 mg/dL — ABNORMAL HIGH (ref 6–23)
CALCIUM: 8.2 mg/dL — AB (ref 8.4–10.5)
CO2: 37 mmol/L — AB (ref 19–32)
Chloride: 96 mmol/L (ref 96–112)
Creatinine, Ser: 0.98 mg/dL (ref 0.50–1.10)
GFR calc Af Amer: 58 mL/min — ABNORMAL LOW (ref >90)
GFR calc non Af Amer: 50 mL/min — ABNORMAL LOW (ref >90)
Glucose, Bld: 173 mg/dL — ABNORMAL HIGH (ref 70–99)
Potassium: 4.1 mmol/L (ref 3.5–5.1)
SODIUM: 138 mmol/L (ref 135–145)

## 2014-08-15 LAB — GLUCOSE, CAPILLARY
GLUCOSE-CAPILLARY: 137 mg/dL — AB (ref 70–99)
Glucose-Capillary: 109 mg/dL — ABNORMAL HIGH (ref 70–99)

## 2014-08-15 MED ORDER — LEVOFLOXACIN 500 MG PO TABS: 500.0000 mg | ORAL_TABLET | Freq: Every day | ORAL | Status: DC

## 2014-08-15 MED ORDER — METHYLPREDNISOLONE (PAK) 4 MG PO TABS: ORAL_TABLET | ORAL | Status: DC

## 2014-08-15 MED ORDER — IPRATROPIUM-ALBUTEROL 0.5-2.5 (3) MG/3ML IN SOLN: 3.0000 mL | Freq: Two times a day (BID) | RESPIRATORY_TRACT | Status: DC

## 2014-08-15 MED ORDER — COLLAGENASE 250 UNIT/GM EX OINT: TOPICAL_OINTMENT | Freq: Every day | CUTANEOUS | Status: DC

## 2014-08-15 NOTE — Progress Notes (Signed)
UR chart review completed.  

## 2014-08-15 NOTE — Clinical Social Work Note (Signed)
[facility] and facility aware and agreeable. CSW left voicemail for pt's daughter, Cardell PeachGay. Pt will transfer with staff.  Derenda FennelKara Dali Kraner, [place] [phone]

## 2014-08-15 NOTE — Progress Notes (Signed)
Subjective: She feels well. She wants to go back to the skilled care facility  Objective: Vital signs in last 24 hours: Temp:  [98.6 F (37 C)-98.7 F (37.1 C)] 98.7 F (37.1 C) (03/28 0631) Pulse Rate:  [72-87] 72 (03/28 0631) Resp:  [20] 20 (03/28 0631) BP: (116-128)/(57-65) 116/61 mmHg (03/28 0631) SpO2:  [96 %-99 %] 97 % (03/28 0650) Weight change:  Last BM Date: 08/12/14  Intake/Output from previous day: 03/27 0701 - 03/28 0700 In: -  Out: 150 [Urine:150]  PHYSICAL EXAM General appearance: alert, cooperative and Enucleation of left eye Resp: clear to auscultation bilaterally Cardio: regular rate and rhythm, S1, S2 normal, no murmur, click, rub or gallop GI: soft, non-tender; bowel sounds normal; no masses,  no organomegaly Extremities: The wound on her leg is about the same  Lab Results:  Results for orders placed or performed during the hospital encounter of 08/11/14 (from the past 48 hour(s))  Glucose, capillary     Status: Abnormal   Collection Time: 08/13/14 11:22 AM  Result Value Ref Range   Glucose-Capillary 144 (H) 70 - 99 mg/dL  Glucose, capillary     Status: Abnormal   Collection Time: 08/13/14  4:33 PM  Result Value Ref Range   Glucose-Capillary 109 (H) 70 - 99 mg/dL   Comment 1 Notify RN   Glucose, capillary     Status: Abnormal   Collection Time: 08/13/14 10:15 PM  Result Value Ref Range   Glucose-Capillary 140 (H) 70 - 99 mg/dL   Comment 1 Notify RN    Comment 2 Document in Chart   Glucose, capillary     Status: Abnormal   Collection Time: 08/14/14  8:00 AM  Result Value Ref Range   Glucose-Capillary 114 (H) 70 - 99 mg/dL  Glucose, capillary     Status: None   Collection Time: 08/14/14 11:42 AM  Result Value Ref Range   Glucose-Capillary 95 70 - 99 mg/dL   Comment 1 Notify RN   Glucose, capillary     Status: Abnormal   Collection Time: 08/14/14  5:04 PM  Result Value Ref Range   Glucose-Capillary 111 (H) 70 - 99 mg/dL   Comment 1 Notify RN    Glucose, capillary     Status: Abnormal   Collection Time: 08/14/14  9:32 PM  Result Value Ref Range   Glucose-Capillary 103 (H) 70 - 99 mg/dL  Basic metabolic panel     Status: Abnormal   Collection Time: 08/15/14  4:40 AM  Result Value Ref Range   Sodium 138 135 - 145 mmol/L   Potassium 4.1 3.5 - 5.1 mmol/L   Chloride 96 96 - 112 mmol/L   CO2 37 (H) 19 - 32 mmol/L   Glucose, Bld 173 (H) 70 - 99 mg/dL   BUN 32 (H) 6 - 23 mg/dL   Creatinine, Ser 0.98 0.50 - 1.10 mg/dL   Calcium 8.2 (L) 8.4 - 10.5 mg/dL   GFR calc non Af Amer 50 (L) >90 mL/min   GFR calc Af Amer 58 (L) >90 mL/min    Comment: (NOTE) The eGFR has been calculated using the CKD EPI equation. This calculation has not been validated in all clinical situations. eGFR's persistently <90 mL/min signify possible Chronic Kidney Disease.    Anion gap 5 5 - 15  Glucose, capillary     Status: Abnormal   Collection Time: 08/15/14  7:33 AM  Result Value Ref Range   Glucose-Capillary 109 (H) 70 - 99 mg/dL  ABGS No results for input(s): PHART, PO2ART, TCO2, HCO3 in the last 72 hours.  Invalid input(s): PCO2 CULTURES Recent Results (from the past 240 hour(s))  Urine culture     Status: None   Collection Time: 08/05/14  7:45 PM  Result Value Ref Range Status   Specimen Description URINE, CATHETERIZED  Final   Special Requests NONE  Final   Colony Count   Final    >=100,000 COLONIES/ML Performed at [facility]    Culture   Final    PROTEUS MIRABILIS Performed at [facility]    Report Status 08/09/2014 FINAL  Final   Organism ID, Bacteria PROTEUS MIRABILIS  Final      Susceptibility   Proteus mirabilis - MIC*    AMPICILLIN <=2 SENSITIVE Sensitive     CEFAZOLIN 8 SENSITIVE Sensitive     CEFTRIAXONE <=1 SENSITIVE Sensitive     CIPROFLOXACIN >=4 RESISTANT Resistant     GENTAMICIN 8 INTERMEDIATE Intermediate     LEVOFLOXACIN >=8 RESISTANT Resistant     NITROFURANTOIN 256 RESISTANT Resistant      TOBRAMYCIN 4 SENSITIVE Sensitive     TRIMETH/SULFA >=320 RESISTANT Resistant     PIP/TAZO <=4 SENSITIVE Sensitive     * PROTEUS MIRABILIS  Blood culture (routine x 2)     Status: None (Preliminary result)   Collection Time: 08/11/14 11:50 PM  Result Value Ref Range Status   Specimen Description BLOOD LEFT ARM  Final   Special Requests BOTTLES DRAWN AEROBIC AND ANAEROBIC 4CC EACH  Final   Culture NO GROWTH 1 DAY  Final   Report Status PENDING  Incomplete  Blood culture (routine x 2)     Status: None (Preliminary result)   Collection Time: 08/12/14 12:20 AM  Result Value Ref Range Status   Specimen Description BLOOD LEFT HAND  Final   Special Requests   Final    BOTTLES DRAWN AEROBIC AND ANAEROBIC AEB 5CC ANA 4CC   Culture NO GROWTH 1 DAY  Final   Report Status PENDING  Incomplete  MRSA PCR Screening     Status: None   Collection Time: 08/12/14  7:01 AM  Result Value Ref Range Status   MRSA by PCR NEGATIVE NEGATIVE Final    Comment:        The GeneXpert MRSA Assay (FDA approved for NASAL specimens only), is one component of a comprehensive MRSA colonization surveillance program. It is not intended to diagnose MRSA infection nor to guide or monitor treatment for MRSA infections.    Studies/Results: No results found.  Medications:  Prior to Admission:  Prescriptions prior to admission  Medication Sig Dispense Refill Last Dose  . alum & mag hydroxide-simeth (MYLANTA) 200-200-20 MG/5ML suspension Take 30 mLs by mouth every 4 (four) hours as needed for indigestion or heartburn.   Past Month at Unknown time  . Amino Acids-Protein Hydrolys (FEEDING SUPPLEMENT, PRO-STAT SUGAR FREE 64,) LIQD Take 30 mLs by mouth 2 (two) times daily.   08/11/2014 at Unknown time  . calcium-vitamin D (OSCAL WITH D) 500-200 MG-UNIT per tablet Take 1 tablet by mouth 3 (three) times daily. For osteoporosis   08/11/2014 at Unknown time  . carboxymethylcellulose (REFRESH PLUS) 0.5 % SOLN Place 1 drop into  the right eye every 6 (six) hours as needed. Dry eyes   Past Month at Unknown time  . docusate sodium (COLACE) 100 MG capsule Take 100 mg by mouth 2 (two) times daily. For constipation   08/11/2014 at Unknown time  .  ENSURE PLUS (ENSURE PLUS) LIQD Take 237 mLs by mouth 2 (two) times daily between meals. Nutritional supplement   08/11/2014 at Unknown time  . escitalopram (LEXAPRO) 10 MG tablet Take 10 mg by mouth daily. For depression   08/11/2014 at Unknown time  . fentaNYL (DURAGESIC - DOSED MCG/HR) 50 MCG/HR Place 50 mcg onto the skin every other day. For pain   08/10/2014  . furosemide (LASIX) 20 MG tablet Take 20 mg by mouth daily. For HTN/ edema   08/11/2014 at Unknown time  . HYDROcodone-acetaminophen (NORCO/VICODIN) 5-325 MG per tablet Take one tablet by mouth three times daily and every 6 hours as needed for pain 210 tablet 0 08/11/2014 at Unknown time  . iron polysaccharides (NIFEREX) 150 MG capsule Take 150 mg by mouth daily.   08/11/2014 at Unknown time  . levalbuterol (XOPENEX) 0.63 MG/3ML nebulizer solution Take 0.63 mg by nebulization every 4 (four) hours as needed for wheezing or shortness of breath.   unknown  . levETIRAcetam (KEPPRA) 250 MG tablet Take 250 mg by mouth every 12 (twelve) hours. For seizures   08/11/2014 at Unknown time  . levothyroxine (SYNTHROID, LEVOTHROID) 175 MCG tablet Take 175 mcg by mouth daily. For thyroid therapy   08/11/2014 at Unknown time  . loratadine (CLARITIN) 10 MG tablet Take 10 mg by mouth daily. For allergies   08/11/2014 at Unknown time  . magnesium hydroxide (MILK OF MAGNESIA) 400 MG/5ML suspension Take 30 mLs by mouth daily as needed for mild constipation.   Past Month at Unknown time  . metoprolol tartrate (LOPRESSOR) 25 MG tablet Take 25 mg by mouth daily. For HTN   08/11/2014 at 0800  . Multiple Vitamin (MULTIVITAMIN WITH MINERALS) TABS Take 1 tablet by mouth daily. For nutritional supplement   08/11/2014 at Unknown time  . phenazopyridine (PYRIDIUM) 95 MG  tablet Take 95 mg by mouth 2 (two) times daily.   Past Week at Unknown time  . phenylephrine-shark liver oil-mineral oil-petrolatum (PREPARATION H) 0.25-3-14-71.9 % rectal ointment Place 1 application rectally as needed for hemorrhoids.   unknown  . ranitidine (ZANTAC) 150 MG tablet Take 150 mg by mouth at bedtime.   08/11/2014 at Unknown time  . rOPINIRole (REQUIP) 4 MG tablet Take 4 mg by mouth at bedtime. For parkinson disease   08/11/2014 at Unknown time  . sennosides-docusate sodium (SENOKOT-S) 8.6-50 MG tablet Take 1 tablet by mouth 2 (two) times daily. For constipation   08/11/2014 at Unknown time  . zolpidem (AMBIEN) 5 MG tablet Take 5 mg by mouth at bedtime as needed for sleep. For insomnia   Past Month at Unknown time   Scheduled: . antiseptic oral rinse  7 mL Mouth Rinse BID  . aztreonam  1 g Intravenous Q8H  . collagenase   Topical Daily  . docusate sodium  100 mg Oral BID  . escitalopram  10 mg Oral Daily  . feeding supplement (ENSURE ENLIVE)  237 mL Oral BID BM  . fentaNYL  50 mcg Transdermal Q48H  . furosemide  20 mg Oral Daily  . heparin  5,000 Units Subcutaneous 3 times per day  . insulin aspart  0-9 Units Subcutaneous TID WC  . ipratropium-albuterol  3 mL Nebulization Q4H  . iron polysaccharides  150 mg Oral Daily  . levETIRAcetam  250 mg Oral Q12H  . levothyroxine  175 mcg Oral QAC breakfast  . loratadine  10 mg Oral Daily  . methylPREDNISolone (SOLU-MEDROL) injection  60 mg Intravenous Q12H  . metoprolol  tartrate  25 mg Oral Daily  . nystatin cream  1 application Topical BID  . pantoprazole  40 mg Oral Daily  . rOPINIRole  4 mg Oral QHS  . senna-docusate  1 tablet Oral BID  . vancomycin  500 mg Intravenous Q24H   Continuous:  [id number], HYDROcodone-acetaminophen, levalbuterol, magnesium hydroxide, zolpidem  Assesment: She was admitted with healthcare associated pneumonia and is remarkably improved. She still has a lot of trouble with the ulceration on her  leg which may or may not be osteomyelitis. She has seizures but none recently. She had acute on chronic respiratory failure which was hypercapnic and hypoxic and that has resolved Active Problems:   Convulsions/seizures   Hypothyroidism   COPD exacerbation   Acute on chronic respiratory failure with hypercapnia   Acute on chronic respiratory failure with hypoxia    Plan: Back to the skilled care facility today. I'm going to have her on oral Levaquin for 6 weeks to try to do something with the leg ulcer. When she was on Levaquin before it seemed to improve clinically. This will be reevaluated in about 10 days and if it's not improving she may need PICC line and IV vancomycin etc.    LOS: 3 days   Roberts Bon L 08/15/2014, 8:01 AM

## 2014-08-15 NOTE — Progress Notes (Signed)
[name] discharged [facility] per MD order.  Report called to receiving [facility] RN at 1450.     Medication List    TAKE these medications        calcium-vitamin D 500-200 MG-UNIT per tablet  Commonly known as:  OSCAL WITH D  Take 1 tablet by mouth 3 (three) times daily. For osteoporosis     carboxymethylcellulose 0.5 % Soln  Commonly known as:  REFRESH PLUS  Place 1 drop into the right eye every 6 (six) hours as needed. Dry eyes     collagenase ointment  Commonly known as:  SANTYL  Apply topically daily.     docusate sodium 100 MG capsule  Commonly known as:  COLACE  Take 100 mg by mouth 2 (two) times daily. For constipation     ENSURE PLUS Liqd  Take 237 mLs by mouth 2 (two) times daily between meals. Nutritional supplement     escitalopram 10 MG tablet  Commonly known as:  LEXAPRO  Take 10 mg by mouth daily. For depression     feeding supplement (PRO-STAT SUGAR FREE 64) Liqd  Take 30 mLs by mouth 2 (two) times daily.     fentaNYL 50 MCG/HR  Commonly known as:  DURAGESIC - dosed mcg/hr  Place 50 mcg onto the skin every other day. For pain     furosemide 20 MG tablet  Commonly known as:  LASIX  Take 20 mg by mouth daily. For HTN/ edema     HYDROcodone-acetaminophen 5-325 MG per tablet  Commonly known as:  NORCO/VICODIN  Take one tablet by mouth three times daily and every 6 hours as needed for pain     iron polysaccharides 150 MG capsule  Commonly known as:  NIFEREX  Take 150 mg by mouth daily.     levalbuterol 0.63 MG/3ML nebulizer solution  Commonly known as:  XOPENEX  Take 0.63 mg by nebulization every 4 (four) hours as needed for wheezing or shortness of breath.     levETIRAcetam 250 MG tablet  Commonly known as:  KEPPRA  Take 250 mg by mouth every 12 (twelve) hours. For seizures     levofloxacin 500 MG tablet  Commonly known as:  LEVAQUIN  Take 1 tablet (500 mg total) by mouth daily.     levothyroxine 175 MCG tablet   Commonly known as:  SYNTHROID, LEVOTHROID  Take 175 mcg by mouth daily. For thyroid therapy     loratadine 10 MG tablet  Commonly known as:  CLARITIN  Take 10 mg by mouth daily. For allergies     magnesium hydroxide 400 MG/5ML suspension  Commonly known as:  MILK OF MAGNESIA  Take 30 mLs by mouth daily as needed for mild constipation.     methylPREDNIsolone 4 MG tablet  Commonly known as:  MEDROL DOSPACK  follow package directions     metoprolol tartrate 25 MG tablet  Commonly known as:  LOPRESSOR  Take 25 mg by mouth daily. For HTN     multivitamin with minerals Tabs tablet  Take 1 tablet by mouth daily. For nutritional supplement     MYLANTA 200-200-20 MG/5ML suspension  Generic drug:  alum & mag hydroxide-simeth  Take 30 mLs by mouth every 4 (four) hours as needed for indigestion or heartburn.     phenazopyridine 95 MG tablet  Commonly known as:  PYRIDIUM  Take 95 mg by mouth 2 (two) times daily.     PREPARATION H 0.25-3-14-71.9 % rectal ointment  Generic drug:  phenylephrine-shark liver oil-mineral oil-petrolatum  Place 1 application rectally as needed for hemorrhoids.     ranitidine 150 MG tablet  Commonly known as:  ZANTAC  Take 150 mg by mouth at bedtime.     rOPINIRole 4 MG tablet  Commonly known as:  REQUIP  Take 4 mg by mouth at bedtime. For parkinson disease     sennosides-docusate sodium 8.6-50 MG tablet  Commonly known as:  SENOKOT-S  Take 1 tablet by mouth 2 (two) times daily. For constipation     zolpidem 5 MG tablet  Commonly known as:  AMBIEN  Take 5 mg by mouth at bedtime as needed for sleep. For insomnia        IV site discontinued and catheter remains intact. Site without signs and symptoms of complications. Dressing and pressure applied.  Patient transported on a bed with RN and nurse tech,  no distress noted upon discharge.  [name], [name] 08/15/2014 1:51 PM

## 2014-08-15 NOTE — Discharge Summary (Signed)
Physician Discharge Summary  Patient ID: [name] GaveLillian C Kaiser MRN: [id number] DOB/AGE: Apr 06, 1923 81 y.o. Primary Care Physician:Orson Rho L, MD Admit date: 08/11/2014 Discharge date: 08/15/2014    Discharge Diagnoses:  Healthcare associated pneumonia Active Problems:   Convulsions/seizures   Hypothyroidism   COPD exacerbation   Acute on chronic respiratory failure with hypercapnia   Acute on chronic respiratory failure with hypoxia   Stasis leg ulcer     Medication List    TAKE these medications        calcium-vitamin D 500-200 MG-UNIT per tablet  Commonly known as:  OSCAL WITH D  Take 1 tablet by mouth 3 (three) times daily. For osteoporosis     carboxymethylcellulose 0.5 % Soln  Commonly known as:  REFRESH PLUS  Place 1 drop into the right eye every 6 (six) hours as needed. Dry eyes     collagenase ointment  Commonly known as:  SANTYL  Apply topically daily.     docusate sodium 100 MG capsule  Commonly known as:  COLACE  Take 100 mg by mouth 2 (two) times daily. For constipation     ENSURE PLUS Liqd  Take 237 mLs by mouth 2 (two) times daily between meals. Nutritional supplement     escitalopram 10 MG tablet  Commonly known as:  LEXAPRO  Take 10 mg by mouth daily. For depression     feeding supplement (PRO-STAT SUGAR FREE 64) Liqd  Take 30 mLs by mouth 2 (two) times daily.     fentaNYL 50 MCG/HR  Commonly known as:  DURAGESIC - dosed mcg/hr  Place 50 mcg onto the skin every other day. For pain     furosemide 20 MG tablet  Commonly known as:  LASIX  Take 20 mg by mouth daily. For HTN/ edema     HYDROcodone-acetaminophen 5-325 MG per tablet  Commonly known as:  NORCO/VICODIN  Take one tablet by mouth three times daily and every 6 hours as needed for pain     iron polysaccharides 150 MG capsule  Commonly known as:  NIFEREX  Take 150 mg by mouth daily.     levalbuterol 0.63 MG/3ML nebulizer solution  Commonly known as:  XOPENEX  Take 0.63 mg by  nebulization every 4 (four) hours as needed for wheezing or shortness of breath.     levETIRAcetam 250 MG tablet  Commonly known as:  KEPPRA  Take 250 mg by mouth every 12 (twelve) hours. For seizures     levofloxacin 500 MG tablet  Commonly known as:  LEVAQUIN  Take 1 tablet (500 mg total) by mouth daily.     levothyroxine 175 MCG tablet  Commonly known as:  SYNTHROID, LEVOTHROID  Take 175 mcg by mouth daily. For thyroid therapy     loratadine 10 MG tablet  Commonly known as:  CLARITIN  Take 10 mg by mouth daily. For allergies     magnesium hydroxide 400 MG/5ML suspension  Commonly known as:  MILK OF MAGNESIA  Take 30 mLs by mouth daily as needed for mild constipation.     methylPREDNIsolone 4 MG tablet  Commonly known as:  MEDROL DOSPACK  follow package directions     metoprolol tartrate 25 MG tablet  Commonly known as:  LOPRESSOR  Take 25 mg by mouth daily. For HTN     multivitamin with minerals Tabs tablet  Take 1 tablet by mouth daily. For nutritional supplement     MYLANTA 200-200-20 MG/5ML suspension  Generic drug:  alum & mag hydroxide-simeth  Take 30 mLs by mouth every 4 (four) hours as needed for indigestion or heartburn.     phenazopyridine 95 MG tablet  Commonly known as:  PYRIDIUM  Take 95 mg by mouth 2 (two) times daily.     PREPARATION H 0.25-3-14-71.9 % rectal ointment  Generic drug:  phenylephrine-shark liver oil-mineral oil-petrolatum  Place 1 application rectally as needed for hemorrhoids.     ranitidine 150 MG tablet  Commonly known as:  ZANTAC  Take 150 mg by mouth at bedtime.     rOPINIRole 4 MG tablet  Commonly known as:  REQUIP  Take 4 mg by mouth at bedtime. For parkinson disease     sennosides-docusate sodium 8.6-50 MG tablet  Commonly known as:  SENOKOT-S  Take 1 tablet by mouth 2 (two) times daily. For constipation     zolpidem 5 MG tablet  Commonly known as:  AMBIEN  Take 5 mg by mouth at bedtime as needed for sleep. For insomnia         Discharged Condition: Improved    Consults: Wound  Significant Diagnostic Studies: Dg Chest 2 View  08/06/2014   CLINICAL DATA:  Increased shortness of breath and cough  EXAM: CHEST  2 VIEW  COMPARISON:  04/04/2014  FINDINGS: Cardiac shadow is stable. A large hiatal hernia is again identified. Bibasilar atelectatic changes are seen slightly worse than that noted on the prior exam. Changes of multiple vertebral augmentations are again noted. No pneumothorax or sizable effusion is seen. No acute bony abnormality is noted.  IMPRESSION: Bibasilar atelectatic changes worse on the right than the left. These half increased in the interval from the prior exam.   Electronically Signed   By: Alcide Clever M.D.   On: 08/06/2014 08:37   Dg Chest 1v Repeat Same Day  08/12/2014   CLINICAL DATA:  Acute respiratory failure with hypercapnia  EXAM: CHEST - 1 VIEW SAME DAY  COMPARISON:  08/12/2014  FINDINGS: Streaky right base opacity, similar to previous.  No appreciable change in cardiomegaly or aortic tortuosity. Large hiatal hernia is again noted. No edema, effusion, or pneumothorax.  IMPRESSION: Right basilar atelectasis or pneumonia, unchanged from earlier today.   Electronically Signed   By: Marnee Spring M.D.   On: 08/12/2014 07:52   Dg Chest Portable 1 View  08/12/2014   CLINICAL DATA:  81 year old female with respiratory distress. History of with fluid and pneumonia January 2016.  EXAM: PORTABLE CHEST - 1 VIEW  COMPARISON:  08/05/2014  FINDINGS: The patient's chin obscures the right mid and upper hemithorax. Lung volumes remain low. Bibasilar atelectasis again seen. There is a large hiatal hernia with increased air compared to prior. The heart size is normal. No large pleural effusion.  IMPRESSION: Bibasilar atelectasis. The patient's chin obscures the right mid and upper hemithorax, repeat imaging recommended. Large hiatal hernia with increased air component compared to prior exam.    Electronically Signed   By: Rubye Oaks M.D.   On: 08/12/2014 01:02    Lab Results: Basic Metabolic Panel:  Recent Labs  16/10/96 0425 08/15/14 0440  NA 139 138  K 4.2 4.1  CL 96 96  CO2 36* 37*  GLUCOSE 143* 173*  BUN 32* 32*  CREATININE 1.12* 0.98  CALCIUM 8.6 8.2*   Liver Function Tests: No results for input(s): AST, ALT, ALKPHOS, BILITOT, PROT, ALBUMIN in the last 72 hours.   CBC:  Recent Labs  08/13/14 0425  WBC 8.1  NEUTROABS 7.5  HGB 8.2*  HCT 26.7*  MCV 92.1  PLT 192    Recent Results (from the past 240 hour(s))  Urine culture     Status: None   Collection Time: 08/05/14  7:45 PM  Result Value Ref Range Status   Specimen Description URINE, CATHETERIZED  Final   Special Requests NONE  Final   Colony Count   Final    >=100,000 COLONIES/ML Performed at [facility]    Culture   Final    PROTEUS MIRABILIS Performed at [facility]    Report Status 08/09/2014 FINAL  Final   Organism ID, Bacteria PROTEUS MIRABILIS  Final      Susceptibility   Proteus mirabilis - MIC*    AMPICILLIN <=2 SENSITIVE Sensitive     CEFAZOLIN 8 SENSITIVE Sensitive     CEFTRIAXONE <=1 SENSITIVE Sensitive     CIPROFLOXACIN >=4 RESISTANT Resistant     GENTAMICIN 8 INTERMEDIATE Intermediate     LEVOFLOXACIN >=8 RESISTANT Resistant     NITROFURANTOIN 256 RESISTANT Resistant     TOBRAMYCIN 4 SENSITIVE Sensitive     TRIMETH/SULFA >=320 RESISTANT Resistant     PIP/TAZO <=4 SENSITIVE Sensitive     * PROTEUS MIRABILIS  Blood culture (routine x 2)     Status: None (Preliminary result)   Collection Time: 08/11/14 11:50 PM  Result Value Ref Range Status   Specimen Description BLOOD LEFT ARM  Final   Special Requests BOTTLES DRAWN AEROBIC AND ANAEROBIC 4CC EACH  Final   Culture NO GROWTH 1 DAY  Final   Report Status PENDING  Incomplete  Blood culture (routine x 2)     Status: None (Preliminary result)   Collection Time: 08/12/14 12:20 AM  Result Value Ref  Range Status   Specimen Description BLOOD LEFT HAND  Final   Special Requests   Final    BOTTLES DRAWN AEROBIC AND ANAEROBIC AEB 5CC ANA 4CC   Culture NO GROWTH 1 DAY  Final   Report Status PENDING  Incomplete  MRSA PCR Screening     Status: None   Collection Time: 08/12/14  7:01 AM  Result Value Ref Range Status   MRSA by PCR NEGATIVE NEGATIVE Final    Comment:        The GeneXpert MRSA Assay (FDA approved for NASAL specimens only), is one component of a comprehensive MRSA colonization surveillance program. It is not intended to diagnose MRSA infection nor to guide or monitor treatment for MRSA infections.      Hospital Course: This is an 81 year old resident of a skilled care facility who had been in her usual state of poor health. She has been battling a ulcer on her ankle. There is some question as to whether she has osteomyelitis. She had been on Levaquin which seemed to have helped and was switched to a different antibiotic clindamycin but seemed to have some problems with pancytopenia from that. She was doing okay when she developed sudden fever cough congestion and was sent to the emergency department. She was found there to have hypercapnic hypoxic respiratory failure. Initial chest x-ray did not show pneumonia but later chest x-ray did. She improved remarkably rapidly. She continued to have trouble with the ulceration on her leg.  Discharge Exam: Blood pressure 116/61, pulse 72, temperature 98.7 F (37.1 C), temperature source Oral, resp. rate 20, height 5' (1.524 m), weight 40.9 kg (90 lb 2.7 oz), SpO2 97 %. She is awake and alert. 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 Her chest  is much clearer was still some rales on the right. The leg ulceration is about the same  Disposition: Back to the skilled care facility. She can have PT OT and speech as needed. Recommendation for Santyl daily to the wound from wound and ostomy will be done. She will be on Levaquin 500 mg daily for 6  weeks. This will be reassessed to see if it seems to be working. If not she will probably need to have PICC line and IV vancomycin. I discussed this with her daughter by phone and all agree with plan of treatment      Discharge Instructions    Discharge to SNF when bed available    Complete by:  As directed              Signed: Shonique Pelphrey L   08/15/2014, 8:12 AM

## 2014-08-20 LAB — CULTURE, BLOOD (ROUTINE X 2)
Culture: NO GROWTH
Culture: NO GROWTH

## 2014-08-22 ENCOUNTER — Encounter (HOSPITAL_COMMUNITY)
Admission: RE | Admit: 2014-08-22 | Discharge: 2014-08-22 | Disposition: A | Discharge status: Home / Self Care | Payer: Medicare Other | Source: Skilled Nursing Facility | Attending: Internal Medicine | Admitting: Internal Medicine

## 2014-08-22 DIAGNOSIS — D649 Anemia, unspecified: Secondary | ICD-10-CM | POA: Diagnosis present

## 2014-08-22 LAB — CBC WITH DIFFERENTIAL/PLATELET
Basophils Absolute: 0 10*3/uL (ref 0.0–0.1)
Basophils Relative: 0 % (ref 0–1)
Eosinophils Absolute: 0.2 10*3/uL (ref 0.0–0.7)
Eosinophils Relative: 3 % (ref 0–5)
HCT: 26.2 % — ABNORMAL LOW (ref 36.0–46.0)
HEMOGLOBIN: 7.8 g/dL — AB (ref 12.0–15.0)
LYMPHS PCT: 11 % — AB (ref 12–46)
Lymphs Abs: 0.7 10*3/uL (ref 0.7–4.0)
MCH: 28.1 pg (ref 26.0–34.0)
MCHC: 29.8 g/dL — AB (ref 30.0–36.0)
MCV: 94.2 fL (ref 78.0–100.0)
MONO ABS: 0.5 10*3/uL (ref 0.1–1.0)
MONOS PCT: 8 % (ref 3–12)
Neutro Abs: 4.8 10*3/uL (ref 1.7–7.7)
Neutrophils Relative %: 78 % — ABNORMAL HIGH (ref 43–77)
Platelets: 233 10*3/uL (ref 150–400)
RBC: 2.78 MIL/uL — ABNORMAL LOW (ref 3.87–5.11)
RDW: 12.8 % (ref 11.5–15.5)
WBC: 6.1 10*3/uL (ref 4.0–10.5)

## 2014-08-22 LAB — BASIC METABOLIC PANEL
ANION GAP: 4 — AB (ref 5–15)
BUN: 32 mg/dL — ABNORMAL HIGH (ref 6–23)
CHLORIDE: 99 mmol/L (ref 96–112)
CO2: 40 mmol/L — AB (ref 19–32)
Calcium: 9.4 mg/dL (ref 8.4–10.5)
Creatinine, Ser: 1.18 mg/dL — ABNORMAL HIGH (ref 0.50–1.10)
GFR calc Af Amer: 46 mL/min — ABNORMAL LOW (ref >90)
GFR calc non Af Amer: 40 mL/min — ABNORMAL LOW (ref >90)
Glucose, Bld: 97 mg/dL (ref 70–99)
Potassium: 4 mmol/L (ref 3.5–5.1)
Sodium: 143 mmol/L (ref 135–145)

## 2014-08-29 ENCOUNTER — Encounter (HOSPITAL_COMMUNITY)
Admission: RE | Admit: 2014-08-29 | Discharge: 2014-08-29 | Disposition: A | Discharge status: Home / Self Care | Payer: Medicare Other | Source: Skilled Nursing Facility | Attending: Pulmonary Disease | Admitting: Pulmonary Disease

## 2014-08-29 DIAGNOSIS — D649 Anemia, unspecified: Secondary | ICD-10-CM | POA: Diagnosis not present

## 2014-08-29 LAB — BASIC METABOLIC PANEL
ANION GAP: 5 (ref 5–15)
BUN: 32 mg/dL — AB (ref 6–23)
CO2: 41 mmol/L (ref 19–32)
Calcium: 9.3 mg/dL (ref 8.4–10.5)
Chloride: 97 mmol/L (ref 96–112)
Creatinine, Ser: 1.3 mg/dL — ABNORMAL HIGH (ref 0.50–1.10)
GFR calc Af Amer: 41 mL/min — ABNORMAL LOW (ref >90)
GFR calc non Af Amer: 35 mL/min — ABNORMAL LOW (ref >90)
Glucose, Bld: 89 mg/dL (ref 70–99)
Potassium: 4.1 mmol/L (ref 3.5–5.1)
Sodium: 143 mmol/L (ref 135–145)

## 2014-08-29 LAB — CBC WITH DIFFERENTIAL/PLATELET
BASOS ABS: 0 10*3/uL (ref 0.0–0.1)
Basophils Relative: 1 % (ref 0–1)
Eosinophils Absolute: 0.1 10*3/uL (ref 0.0–0.7)
Eosinophils Relative: 3 % (ref 0–5)
HCT: 24.9 % — ABNORMAL LOW (ref 36.0–46.0)
Hemoglobin: 7.4 g/dL — ABNORMAL LOW (ref 12.0–15.0)
LYMPHS ABS: 0.5 10*3/uL — AB (ref 0.7–4.0)
Lymphocytes Relative: 12 % (ref 12–46)
MCH: 28.1 pg (ref 26.0–34.0)
MCHC: 29.7 g/dL — ABNORMAL LOW (ref 30.0–36.0)
MCV: 94.7 fL (ref 78.0–100.0)
MONO ABS: 0.4 10*3/uL (ref 0.1–1.0)
Monocytes Relative: 9 % (ref 3–12)
NEUTROS ABS: 3.3 10*3/uL (ref 1.7–7.7)
Neutrophils Relative %: 75 % (ref 43–77)
Platelets: 144 10*3/uL — ABNORMAL LOW (ref 150–400)
RBC: 2.63 MIL/uL — AB (ref 3.87–5.11)
RDW: 13 % (ref 11.5–15.5)
WBC: 4.4 10*3/uL (ref 4.0–10.5)

## 2014-09-01 ENCOUNTER — Encounter (HOSPITAL_COMMUNITY)
Admission: RE | Admit: 2014-09-01 | Discharge: 2014-09-01 | Disposition: A | Discharge status: Home / Self Care | Payer: Medicare Other | Source: Ambulatory Visit | Attending: Internal Medicine | Admitting: Internal Medicine

## 2014-09-01 ENCOUNTER — Encounter (HOSPITAL_COMMUNITY)
Admission: AD | Admit: 2014-09-01 | Discharge: 2014-09-01 | Disposition: A | Discharge status: Home / Self Care | Payer: Medicare Other | Source: Skilled Nursing Facility | Attending: Internal Medicine | Admitting: Internal Medicine

## 2014-09-01 DIAGNOSIS — D649 Anemia, unspecified: Secondary | ICD-10-CM | POA: Diagnosis not present

## 2014-09-01 LAB — URINALYSIS, ROUTINE W REFLEX MICROSCOPIC
Bilirubin Urine: NEGATIVE
GLUCOSE, UA: NEGATIVE mg/dL
Hgb urine dipstick: NEGATIVE
KETONES UR: NEGATIVE mg/dL
LEUKOCYTES UA: NEGATIVE
NITRITE: NEGATIVE
PH: 7.5 (ref 5.0–8.0)
Protein, ur: NEGATIVE mg/dL
SPECIFIC GRAVITY, URINE: 1.02 (ref 1.005–1.030)
Urobilinogen, UA: 0.2 mg/dL (ref 0.0–1.0)

## 2014-09-03 LAB — URINE CULTURE
COLONY COUNT: NO GROWTH
Culture: NO GROWTH

## 2014-09-05 ENCOUNTER — Encounter (HOSPITAL_COMMUNITY)
Admission: RE | Admit: 2014-09-05 | Discharge: 2014-09-05 | Disposition: A | Discharge status: Home / Self Care | Payer: Medicare Other | Source: Skilled Nursing Facility | Attending: Pulmonary Disease | Admitting: Pulmonary Disease

## 2014-09-05 DIAGNOSIS — D649 Anemia, unspecified: Secondary | ICD-10-CM | POA: Diagnosis not present

## 2014-09-05 LAB — CBC WITH DIFFERENTIAL/PLATELET
BASOS PCT: 1 % (ref 0–1)
Basophils Absolute: 0 10*3/uL (ref 0.0–0.1)
EOS ABS: 0.2 10*3/uL (ref 0.0–0.7)
Eosinophils Relative: 6 % — ABNORMAL HIGH (ref 0–5)
HCT: 25.5 % — ABNORMAL LOW (ref 36.0–46.0)
Hemoglobin: 7.4 g/dL — ABNORMAL LOW (ref 12.0–15.0)
LYMPHS ABS: 0.4 10*3/uL — AB (ref 0.7–4.0)
Lymphocytes Relative: 15 % (ref 12–46)
MCH: 27.5 pg (ref 26.0–34.0)
MCHC: 29 g/dL — ABNORMAL LOW (ref 30.0–36.0)
MCV: 94.8 fL (ref 78.0–100.0)
Monocytes Absolute: 0.4 10*3/uL (ref 0.1–1.0)
Monocytes Relative: 13 % — ABNORMAL HIGH (ref 3–12)
Neutro Abs: 1.9 10*3/uL (ref 1.7–7.7)
Neutrophils Relative %: 65 % (ref 43–77)
Platelets: 158 10*3/uL (ref 150–400)
RBC: 2.69 MIL/uL — ABNORMAL LOW (ref 3.87–5.11)
RDW: 13.1 % (ref 11.5–15.5)
WBC: 2.9 10*3/uL — ABNORMAL LOW (ref 4.0–10.5)

## 2014-09-05 LAB — BASIC METABOLIC PANEL
BUN: 35 mg/dL — AB (ref 6–23)
CHLORIDE: 99 mmol/L (ref 96–112)
CO2: 43 mmol/L (ref 19–32)
Calcium: 9.6 mg/dL (ref 8.4–10.5)
Creatinine, Ser: 1.16 mg/dL — ABNORMAL HIGH (ref 0.50–1.10)
GFR calc Af Amer: 47 mL/min — ABNORMAL LOW (ref >90)
GFR calc non Af Amer: 40 mL/min — ABNORMAL LOW (ref >90)
GLUCOSE: 88 mg/dL (ref 70–99)
Potassium: 4.3 mmol/L (ref 3.5–5.1)
Sodium: 144 mmol/L (ref 135–145)

## 2014-09-10 ENCOUNTER — Inpatient Hospital Stay (HOSPITAL_COMMUNITY)
Admission: EM | Admit: 2014-09-10 | Discharge: 2014-09-13 | DRG: 177 | Disposition: A | Discharge status: Skilled Nursing Facility | Payer: Medicare Other | Attending: Pulmonary Disease | Admitting: Pulmonary Disease

## 2014-09-10 ENCOUNTER — Encounter (HOSPITAL_COMMUNITY): Payer: Self-pay | Admitting: *Deleted

## 2014-09-10 ENCOUNTER — Emergency Department (HOSPITAL_COMMUNITY): Payer: Medicare Other

## 2014-09-10 DIAGNOSIS — E039 Hypothyroidism, unspecified: Secondary | ICD-10-CM | POA: Diagnosis present

## 2014-09-10 DIAGNOSIS — R1310 Dysphagia, unspecified: Secondary | ICD-10-CM | POA: Diagnosis present

## 2014-09-10 DIAGNOSIS — Z66 Do not resuscitate: Secondary | ICD-10-CM | POA: Diagnosis present

## 2014-09-10 DIAGNOSIS — G4700 Insomnia, unspecified: Secondary | ICD-10-CM | POA: Diagnosis present

## 2014-09-10 DIAGNOSIS — I83018 Varicose veins of right lower extremity with ulcer other part of lower leg: Secondary | ICD-10-CM

## 2014-09-10 DIAGNOSIS — J690 Pneumonitis due to inhalation of food and vomit: Principal | ICD-10-CM | POA: Diagnosis present

## 2014-09-10 DIAGNOSIS — E43 Unspecified severe protein-calorie malnutrition: Secondary | ICD-10-CM | POA: Diagnosis present

## 2014-09-10 DIAGNOSIS — M810 Age-related osteoporosis without current pathological fracture: Secondary | ICD-10-CM | POA: Diagnosis present

## 2014-09-10 DIAGNOSIS — Z809 Family history of malignant neoplasm, unspecified: Secondary | ICD-10-CM | POA: Diagnosis not present

## 2014-09-10 DIAGNOSIS — F329 Major depressive disorder, single episode, unspecified: Secondary | ICD-10-CM | POA: Diagnosis present

## 2014-09-10 DIAGNOSIS — I83019 Varicose veins of right lower extremity with ulcer of unspecified site: Secondary | ICD-10-CM

## 2014-09-10 DIAGNOSIS — J189 Pneumonia, unspecified organism: Secondary | ICD-10-CM

## 2014-09-10 DIAGNOSIS — Z792 Long term (current) use of antibiotics: Secondary | ICD-10-CM

## 2014-09-10 DIAGNOSIS — I10 Essential (primary) hypertension: Secondary | ICD-10-CM | POA: Diagnosis present

## 2014-09-10 DIAGNOSIS — R569 Unspecified convulsions: Secondary | ICD-10-CM

## 2014-09-10 DIAGNOSIS — L97909 Non-pressure chronic ulcer of unspecified part of unspecified lower leg with unspecified severity: Secondary | ICD-10-CM

## 2014-09-10 DIAGNOSIS — Z8522 Personal history of malignant neoplasm of nasal cavities, middle ear, and accessory sinuses: Secondary | ICD-10-CM | POA: Diagnosis not present

## 2014-09-10 DIAGNOSIS — I83009 Varicose veins of unspecified lower extremity with ulcer of unspecified site: Secondary | ICD-10-CM | POA: Diagnosis present

## 2014-09-10 DIAGNOSIS — L97329 Non-pressure chronic ulcer of left ankle with unspecified severity: Secondary | ICD-10-CM | POA: Diagnosis present

## 2014-09-10 DIAGNOSIS — R0602 Shortness of breath: Secondary | ICD-10-CM | POA: Diagnosis present

## 2014-09-10 DIAGNOSIS — I83013 Varicose veins of right lower extremity with ulcer of ankle: Secondary | ICD-10-CM

## 2014-09-10 DIAGNOSIS — E46 Unspecified protein-calorie malnutrition: Secondary | ICD-10-CM | POA: Diagnosis present

## 2014-09-10 DIAGNOSIS — K5900 Constipation, unspecified: Secondary | ICD-10-CM | POA: Diagnosis present

## 2014-09-10 DIAGNOSIS — Z681 Body mass index (BMI) 19 or less, adult: Secondary | ICD-10-CM | POA: Diagnosis not present

## 2014-09-10 DIAGNOSIS — I83015 Varicose veins of right lower extremity with ulcer other part of foot: Secondary | ICD-10-CM

## 2014-09-10 DIAGNOSIS — I83011 Varicose veins of right lower extremity with ulcer of thigh: Secondary | ICD-10-CM

## 2014-09-10 DIAGNOSIS — Y95 Nosocomial condition: Secondary | ICD-10-CM | POA: Diagnosis present

## 2014-09-10 DIAGNOSIS — I83014 Varicose veins of right lower extremity with ulcer of heel and midfoot: Secondary | ICD-10-CM

## 2014-09-10 DIAGNOSIS — F32A Depression, unspecified: Secondary | ICD-10-CM | POA: Diagnosis present

## 2014-09-10 DIAGNOSIS — I83012 Varicose veins of right lower extremity with ulcer of calf: Secondary | ICD-10-CM

## 2014-09-10 LAB — BASIC METABOLIC PANEL
Anion gap: 5 (ref 5–15)
BUN: 27 mg/dL — ABNORMAL HIGH (ref 6–23)
CHLORIDE: 96 mmol/L (ref 96–112)
CO2: 42 mmol/L (ref 19–32)
CREATININE: 1.06 mg/dL (ref 0.50–1.10)
Calcium: 9.6 mg/dL (ref 8.4–10.5)
GFR, EST AFRICAN AMERICAN: 52 mL/min — AB (ref >90)
GFR, EST NON AFRICAN AMERICAN: 45 mL/min — AB (ref >90)
Glucose, Bld: 129 mg/dL — ABNORMAL HIGH (ref 70–99)
Potassium: 3.9 mmol/L (ref 3.5–5.1)
Sodium: 143 mmol/L (ref 135–145)

## 2014-09-10 LAB — CBC
HCT: 28.7 % — ABNORMAL LOW (ref 36.0–46.0)
HEMOGLOBIN: 8.3 g/dL — AB (ref 12.0–15.0)
MCH: 27.4 pg (ref 26.0–34.0)
MCHC: 28.9 g/dL — ABNORMAL LOW (ref 30.0–36.0)
MCV: 94.7 fL (ref 78.0–100.0)
Platelets: 215 10*3/uL (ref 150–400)
RBC: 3.03 MIL/uL — ABNORMAL LOW (ref 3.87–5.11)
RDW: 13.2 % (ref 11.5–15.5)
WBC: 3.5 10*3/uL — ABNORMAL LOW (ref 4.0–10.5)

## 2014-09-10 LAB — BRAIN NATRIURETIC PEPTIDE: B Natriuretic Peptide: 211 pg/mL — ABNORMAL HIGH (ref 0.0–100.0)

## 2014-09-10 LAB — TROPONIN I: Troponin I: 0.04 ng/mL — ABNORMAL HIGH (ref <0.031)

## 2014-09-10 MED ORDER — HEPARIN SODIUM (PORCINE) 5000 UNIT/ML IJ SOLN
5000.0000 [IU] | Freq: Three times a day (TID) | INTRAMUSCULAR | Status: DC
  Administered 2014-09-11 – 2014-09-13 (×7): 5000 [IU] via SUBCUTANEOUS
  Filled 2014-09-10 (×4): qty 1

## 2014-09-10 MED ORDER — LEVALBUTEROL HCL 0.63 MG/3ML IN NEBU
0.6300 mg | INHALATION_SOLUTION | RESPIRATORY_TRACT | Status: DC | PRN
  Administered 2014-09-11: 0.63 mg via RESPIRATORY_TRACT
  Filled 2014-09-10: qty 3

## 2014-09-10 MED ORDER — DEXTROSE 5 % IV SOLN
2.0000 g | Freq: Once | INTRAVENOUS | Status: AC
  Administered 2014-09-10: 2 g via INTRAVENOUS
  Filled 2014-09-10: qty 2

## 2014-09-10 MED ORDER — DEXTROSE 5 % IV SOLN
INTRAVENOUS | Status: AC
  Filled 2014-09-10: qty 1

## 2014-09-10 MED ORDER — LEVOTHYROXINE SODIUM 50 MCG PO TABS
175.0000 ug | ORAL_TABLET | Freq: Every day | ORAL | Status: DC
  Administered 2014-09-11 – 2014-09-13 (×3): 175 ug via ORAL
  Filled 2014-09-10 (×3): qty 4

## 2014-09-10 MED ORDER — FUROSEMIDE 40 MG PO TABS
20.0000 mg | ORAL_TABLET | Freq: Every day | ORAL | Status: DC
  Administered 2014-09-10 – 2014-09-13 (×4): 20 mg via ORAL
  Filled 2014-09-10 (×4): qty 1

## 2014-09-10 MED ORDER — ROPINIROLE HCL 1 MG PO TABS
ORAL_TABLET | ORAL | Status: AC
  Filled 2014-09-10: qty 4

## 2014-09-10 MED ORDER — METOPROLOL TARTRATE 25 MG PO TABS
25.0000 mg | ORAL_TABLET | Freq: Every day | ORAL | Status: DC
  Administered 2014-09-11 – 2014-09-13 (×3): 25 mg via ORAL
  Filled 2014-09-10 (×3): qty 1

## 2014-09-10 MED ORDER — VANCOMYCIN HCL IN DEXTROSE 1-5 GM/200ML-% IV SOLN
1000.0000 mg | Freq: Once | INTRAVENOUS | Status: AC
  Administered 2014-09-10: 1000 mg via INTRAVENOUS
  Filled 2014-09-10: qty 200

## 2014-09-10 MED ORDER — ROPINIROLE HCL 1 MG PO TABS
4.0000 mg | ORAL_TABLET | Freq: Every day | ORAL | Status: DC
  Administered 2014-09-10 – 2014-09-12 (×3): 4 mg via ORAL
  Filled 2014-09-10 (×4): qty 4

## 2014-09-10 MED ORDER — ZOLPIDEM TARTRATE 5 MG PO TABS
5.0000 mg | ORAL_TABLET | Freq: Every evening | ORAL | Status: DC | PRN
  Administered 2014-09-10 – 2014-09-12 (×2): 5 mg via ORAL
  Filled 2014-09-10 (×2): qty 1

## 2014-09-10 MED ORDER — ESCITALOPRAM OXALATE 10 MG PO TABS
10.0000 mg | ORAL_TABLET | Freq: Every day | ORAL | Status: DC
  Administered 2014-09-12 – 2014-09-13 (×2): 10 mg via ORAL
  Filled 2014-09-10 (×2): qty 1

## 2014-09-10 MED ORDER — ENSURE ENLIVE PO LIQD
237.0000 mL | Freq: Two times a day (BID) | ORAL | Status: DC
  Administered 2014-09-11 – 2014-09-13 (×5): 237 mL via ORAL

## 2014-09-10 MED ORDER — RISAQUAD PO CAPS
2.0000 | ORAL_CAPSULE | Freq: Every day | ORAL | Status: DC
  Administered 2014-09-10 – 2014-09-13 (×4): 2 via ORAL
  Filled 2014-09-10 (×4): qty 2

## 2014-09-10 MED ORDER — DEXTROSE 5 % IV SOLN
1.0000 g | Freq: Three times a day (TID) | INTRAVENOUS | Status: DC
  Filled 2014-09-10 (×5): qty 1

## 2014-09-10 MED ORDER — CEFEPIME HCL 2 G IJ SOLR
INTRAMUSCULAR | Status: AC
  Filled 2014-09-10: qty 2

## 2014-09-10 MED ORDER — DOCUSATE SODIUM 100 MG PO CAPS
100.0000 mg | ORAL_CAPSULE | Freq: Two times a day (BID) | ORAL | Status: DC
  Administered 2014-09-10 – 2014-09-13 (×6): 100 mg via ORAL
  Filled 2014-09-10 (×6): qty 1

## 2014-09-10 MED ORDER — LEVETIRACETAM 250 MG PO TABS
250.0000 mg | ORAL_TABLET | Freq: Two times a day (BID) | ORAL | Status: DC
  Administered 2014-09-10 – 2014-09-13 (×5): 250 mg via ORAL
  Filled 2014-09-10 (×5): qty 1

## 2014-09-10 MED ORDER — ACETAMINOPHEN 650 MG RE SUPP: 650.0000 mg | Freq: Four times a day (QID) | RECTAL | Status: DC | PRN

## 2014-09-10 MED ORDER — ONDANSETRON HCL 4 MG/2ML IJ SOLN: 4.0000 mg | Freq: Four times a day (QID) | INTRAMUSCULAR | Status: DC | PRN

## 2014-09-10 MED ORDER — IPRATROPIUM-ALBUTEROL 0.5-2.5 (3) MG/3ML IN SOLN
3.0000 mL | Freq: Once | RESPIRATORY_TRACT | Status: AC
  Administered 2014-09-10: 3 mL via RESPIRATORY_TRACT
  Filled 2014-09-10: qty 3

## 2014-09-10 MED ORDER — HYDROCODONE-ACETAMINOPHEN 5-325 MG PO TABS
1.0000 | ORAL_TABLET | Freq: Four times a day (QID) | ORAL | Status: DC | PRN
Start: 2014-09-10 — End: 2014-09-13

## 2014-09-10 MED ORDER — ACETAMINOPHEN 325 MG PO TABS
650.0000 mg | ORAL_TABLET | Freq: Four times a day (QID) | ORAL | Status: DC | PRN
Start: 2014-09-10 — End: 2014-09-13
  Administered 2014-09-11: 650 mg via ORAL
  Filled 2014-09-10: qty 2

## 2014-09-10 MED ORDER — DEXTROSE 5 % IV SOLN
2.0000 g | Freq: Three times a day (TID) | INTRAVENOUS | Status: DC
  Filled 2014-09-10 (×5): qty 2

## 2014-09-10 MED ORDER — SENNOSIDES-DOCUSATE SODIUM 8.6-50 MG PO TABS
1.0000 | ORAL_TABLET | Freq: Two times a day (BID) | ORAL | Status: DC
  Administered 2014-09-10 – 2014-09-13 (×6): 1 via ORAL
  Filled 2014-09-10 (×6): qty 1

## 2014-09-10 MED ORDER — ACETAMINOPHEN 325 MG PO TABS
ORAL_TABLET | ORAL | Status: AC
  Administered 2014-09-10: 650 mg
  Filled 2014-09-10: qty 2

## 2014-09-10 MED ORDER — ONDANSETRON HCL 4 MG PO TABS: 4.0000 mg | ORAL_TABLET | Freq: Four times a day (QID) | ORAL | Status: DC | PRN

## 2014-09-10 MED ORDER — VANCOMYCIN HCL IN DEXTROSE 1-5 GM/200ML-% IV SOLN
INTRAVENOUS | Status: AC
  Filled 2014-09-10: qty 200

## 2014-09-10 NOTE — Progress Notes (Signed)
ANTIBIOTIC CONSULT NOTE-Preliminary  Pharmacy Consult for Vancomycin and Cefepime Indication: pneumonia  Allergies  Allergen Reactions  . Aricept [Donepezil Hcl]   . Bactrim [Sulfamethoxazole-Trimethoprim]   . Ciprofloxacin   . Darvocet [Propoxyphene N-Acetaminophen]   . Namenda [Memantine Hcl]   . Penicillins   . Vibramycin [Doxycycline Calcium]     Patient Measurements: Height: 5' (152.4 cm) Weight: 90 lb (40.824 kg) IBW/kg (Calculated) : 45.5  Vital Signs: Temp: 98.3 F (36.8 C) (04/23 1929) BP: 153/74 mmHg (04/23 2000) Pulse Rate: 104 (04/23 2000)  Labs:  Recent Labs  09/10/14 1945  WBC 3.5*  HGB 8.3*  PLT 215  CREATININE 1.06    Estimated Creatinine Clearance: 23.2 mL/min (by C-G formula based on Cr of 1.06).  No results for input(s): VANCOTROUGH, VANCOPEAK, VANCORANDOM, GENTTROUGH, GENTPEAK, GENTRANDOM, TOBRATROUGH, TOBRAPEAK, TOBRARND, AMIKACINPEAK, AMIKACINTROU, AMIKACIN in the last 72 hours.   Microbiology: Recent Results (from the past 720 hour(s))  Blood culture (routine x 2)     Status: None   Collection Time: 08/11/14 11:50 PM  Result Value Ref Range Status   Specimen Description BLOOD LEFT ARM  Final   Special Requests BOTTLES DRAWN AEROBIC AND ANAEROBIC 4CC EACH  Final   Culture NO GROWTH 8 DAYS  Final   Report Status 08/20/2014 FINAL  Final  Blood culture (routine x 2)     Status: None   Collection Time: 08/12/14 12:20 AM  Result Value Ref Range Status   Specimen Description BLOOD LEFT HAND  Final   Special Requests   Final    BOTTLES DRAWN AEROBIC AND ANAEROBIC AEB 5CC ANA 4CC   Culture NO GROWTH 8 DAYS  Final   Report Status 08/20/2014 FINAL  Final  MRSA PCR Screening     Status: None   Collection Time: 08/12/14  7:01 AM  Result Value Ref Range Status   MRSA by PCR NEGATIVE NEGATIVE Final    Comment:        The GeneXpert MRSA Assay (FDA approved for NASAL specimens only), is one component of a comprehensive MRSA  colonization surveillance program. It is not intended to diagnose MRSA infection nor to guide or monitor treatment for MRSA infections.   Culture, Urine     Status: None   Collection Time: 09/01/14  8:10 PM  Result Value Ref Range Status   Specimen Description URINE, RANDOM  Final   Special Requests NONE  Final   Colony Count NO GROWTH Performed at [facility]   Final   Culture NO GROWTH Performed at [facility]   Final   Report Status 09/03/2014 FINAL  Final    Medical History: Past Medical History  Diagnosis Date  . Seizures   . Aspiration pneumonia   . Dysphagia   . Osteoporosis   . Thyroid disease   . Depression   . MRSA (methicillin resistant staph aureus) culture positive   . Respiratory failure   . Hypokalemia   . Reflux   . Vertebral compression fracture     thoracic and lumbar  . History of sinus cancer 1970's    left side  . Chronic pain   . Hypertension   . GERD (gastroesophageal reflux disease)    Anti-infectives    Start     Dose/Rate Route Frequency Ordered Stop   09/10/14 2200  vancomycin (VANCOCIN) IVPB 1000 mg/200 mL premix     1,000 mg 200 mL/hr over 60 Minutes Intravenous  Once 09/10/14 2118     09/10/14 2130  ceFEPIme (MAXIPIME) 2 g in dextrose 5 % 50 mL IVPB     2 g 100 mL/hr over 30 Minutes Intravenous  Once 09/10/14 2118        Assessment: 81yo female with small body habitus.  SCr at baseline. Pt admitted with respiratory failure and pneumonia.  Asked to initiate Vancomycin and Cefepime.  Goal of Therapy:  Vancomycin trough level 15-20 mcg/ml  Plan:  Preliminary review of pertinent patient information completed.  Protocol will be initiated with a one-time dose(s) of Cefepime 2gm and Vancomycin 1000mg .  Jeani [facility]king[facility]nie [facility] clinical pharmacist will complete review during morning rounds to assess patient and finalize treatment regimen.  Wayland DenisHall, Rheagan Nayak A, [place] 09/10/2014,9:18 PM

## 2014-09-10 NOTE — ED Provider Notes (Addendum)
CSN: [id number]     Arrival date & time 09/10/14  1923 History   First MD Initiated Contact with Patient 09/10/14 1931     Chief Complaint  Patient presents with  . Shortness of Breath     (Consider location/radiation/quality/duration/timing/severity/associated sxs/prior Treatment) HPI.... Level V caveat for inability to give history. Patient with known history of respiratory failure and pneumonia presents with worsening dyspnea and chest congestion. Patient was admitted on 08/12/14 with acute on chronic respiratory failure.  Daughter states she appears better tonight then last emergency department visit  Past Medical History  Diagnosis Date  . Seizures   . Aspiration pneumonia   . Dysphagia   . Osteoporosis   . Thyroid disease   . Depression   . MRSA (methicillin resistant staph aureus) culture positive   . Respiratory failure   . Hypokalemia   . Reflux   . Vertebral compression fracture     thoracic and lumbar  . History of sinus cancer 1970's    left side  . Chronic pain   . Hypertension   . GERD (gastroesophageal reflux disease)    Past Surgical History  Procedure Laterality Date  . Kyphoplasty      Lumbar and thoracic  . Palate surgery  1970's    removal of hard and soft palate on left side  . Enucleation Left 1970's    left eye  . Cholecystectomy    . Appendectomy    . Cesarean section     History reviewed. No pertinent family history. History  Substance Use Topics  . Smoking status: Never Smoker   . Smokeless tobacco: Not on file  . Alcohol Use: No   OB History    No data available     Review of Systems  Unable to perform ROS: Acuity of condition      Allergies  Aricept; Bactrim; Ciprofloxacin; Darvocet; Namenda; Penicillins; and Vibramycin  Home Medications   Prior to Admission medications   Medication Sig Start Date End Date Taking? Authorizing Provider  Amino Acids-Protein Hydrolys (FEEDING SUPPLEMENT, PRO-STAT SUGAR FREE 64,) LIQD Take 30  mLs by mouth 2 (two) times daily.   Yes Historical Provider, MD  calcium-vitamin D (OSCAL WITH D) 500-200 MG-UNIT per tablet Take 1 tablet by mouth 3 (three) times daily. For osteoporosis   Yes Historical Provider, MD  Cranberry-Vitamin C-Probiotic (AZO CRANBERRY PO) Take 1 tablet by mouth 2 (two) times daily.   Yes Historical Provider, MD  docusate sodium (COLACE) 100 MG capsule Take 100 mg by mouth 2 (two) times daily. For constipation   Yes Historical Provider, MD  ENSURE PLUS (ENSURE PLUS) LIQD Take 237 mLs by mouth 2 (two) times daily between meals. Nutritional supplement   Yes Historical Provider, MD  escitalopram (LEXAPRO) 10 MG tablet Take 10 mg by mouth daily. For depression   Yes Historical Provider, MD  furosemide (LASIX) 20 MG tablet Take 20 mg by mouth daily. For HTN/ edema   Yes Historical Provider, MD  hydroxypropyl methylcellulose / hypromellose (ISOPTO TEARS / GONIOVISC) 2.5 % ophthalmic solution Place 1 drop into the right eye as needed for dry eyes.   Yes Historical Provider, MD  iron polysaccharides (NIFEREX) 150 MG capsule Take 150 mg by mouth daily.   Yes Historical Provider, MD  lactobacillus acidophilus (BACID) TABS tablet Take 2 tablets by mouth daily.   Yes Historical Provider, MD  levETIRAcetam (KEPPRA) 250 MG tablet Take 250 mg by mouth every 12 (twelve) hours. For seizures   Yes  Historical Provider, MD  levofloxacin (LEVAQUIN) 250 MG tablet Take 250 mg by mouth daily.   Yes Historical Provider, MD  levothyroxine (SYNTHROID, LEVOTHROID) 175 MCG tablet Take 175 mcg by mouth daily. For thyroid therapy   Yes Historical Provider, MD  loratadine (CLARITIN) 10 MG tablet Take 10 mg by mouth daily. For allergies   Yes Historical Provider, MD  metoprolol tartrate (LOPRESSOR) 25 MG tablet Take 25 mg by mouth daily. For HTN   Yes Historical Provider, MD  Multiple Vitamin (MULTIVITAMIN WITH MINERALS) TABS Take 1 tablet by mouth daily. For nutritional supplement   Yes Historical  Provider, MD  ranitidine (ZANTAC) 150 MG tablet Take 150 mg by mouth at bedtime.   Yes Historical Provider, MD  rOPINIRole (REQUIP) 4 MG tablet Take 4 mg by mouth at bedtime. For parkinson disease   Yes Historical Provider, MD  sennosides-docusate sodium (SENOKOT-S) 8.6-50 MG tablet Take 1 tablet by mouth 2 (two) times daily. For constipation   Yes Historical Provider, MD  alum & mag hydroxide-simeth (MYLANTA) 200-200-20 MG/5ML suspension Take 30 mLs by mouth every 4 (four) hours as needed for indigestion or heartburn.    Historical Provider, MD  collagenase (SANTYL) ointment Apply topically daily. Patient not taking: Reported on 09/10/2014 08/15/14   Kari Baars, MD  HYDROcodone-acetaminophen (NORCO/VICODIN) 5-325 MG per tablet Take one tablet by mouth three times daily and every 6 hours as needed for pain 01/26/14   Oneal Grout, MD  levalbuterol (XOPENEX) 0.63 MG/3ML nebulizer solution Take 0.63 mg by nebulization every 4 (four) hours as needed for wheezing or shortness of breath.    Historical Provider, MD  levofloxacin (LEVAQUIN) 500 MG tablet Take 1 tablet (500 mg total) by mouth daily. Patient not taking: Reported on 09/10/2014 08/15/14   Kari Baars, MD  magnesium hydroxide (MILK OF MAGNESIA) 400 MG/5ML suspension Take 30 mLs by mouth daily as needed for mild constipation.    Historical Provider, MD  methylPREDNIsolone (MEDROL DOSPACK) 4 MG tablet follow package directions Patient not taking: Reported on 09/10/2014 08/15/14   Kari Baars, MD  phenylephrine-shark liver oil-mineral oil-petrolatum (PREPARATION H) 0.25-3-14-71.9 % rectal ointment Place 1 application rectally as needed for hemorrhoids.    Historical Provider, MD  zolpidem (AMBIEN) 5 MG tablet Take 5 mg by mouth at bedtime as needed for sleep. For insomnia    Historical Provider, MD   BP 153/74 mmHg  Pulse 104  Temp(Src) 98.3 F (36.8 C)  Resp 23  Ht 5' (1.524 m)  Wt 90 lb (40.824 kg)  BMI 17.58 kg/m2  SpO2 96% Physical  Exam  Constitutional: She appears well-developed and well-nourished.  HENT:  Head: Normocephalic and atraumatic.  Eyes: Conjunctivae and EOM are normal. Pupils are equal, round, and reactive to light.  Neck: Normal range of motion. Neck supple.  Cardiovascular: Normal rate and regular rhythm.   Pulmonary/Chest: Effort normal.  Decreased breath sounds on right  Abdominal: Soft. Bowel sounds are normal.  Musculoskeletal: Normal range of motion.  Neurological:  Moving all extremities  Skin: Skin is warm and dry.  Psychiatric:  Mild dementia  Nursing note and vitals reviewed.   ED Course  Procedures (including critical care time) Labs Review Labs Reviewed  BASIC METABOLIC PANEL - Abnormal; Notable for the following:    CO2 42 (*)    Glucose, Bld 129 (*)    BUN 27 (*)    GFR calc non Af Amer 45 (*)    GFR calc Af Amer 52 (*)  All other components within normal limits  CBC - Abnormal; Notable for the following:    WBC 3.5 (*)    RBC 3.03 (*)    Hemoglobin 8.3 (*)    HCT 28.7 (*)    MCHC 28.9 (*)    All other components within normal limits  BRAIN NATRIURETIC PEPTIDE  TROPONIN I    Imaging Review Dg Chest Port 1 View  09/10/2014   CLINICAL DATA:  Shortness of breath, pneumonia  EXAM: PORTABLE CHEST - 1 VIEW  COMPARISON:  08/12/2014  FINDINGS: Interval increase in consolidation at the right lung base. Retrocardiac opacity is also identified. Patient's head obscures detail over the apices. Heart size is upper limits of normal. Evidence of vertebral augmentation. Chronic right humeral dislocation. Right apical skin fold simulates a pneumothorax.  IMPRESSION: Increased right greater than left lower lobe consolidation, suspicious for progressive pneumonia. Followup to radiographic resolution is recommended in 3-4 weeks to exclude underlying neoplasm.  Chronic right humeral dislocation.   Electronically Signed   By: Christiana PellantGretchen  Green M.D.   On: 09/10/2014 20:22     EKG  Interpretation   Date/Time:  Saturday September 10 2014 19:47:35 EDT Ventricular Rate:  100 PR Interval:  139 QRS Duration: 83 QT Interval:  348 QTC Calculation: 449 R Axis:   -4 Text Interpretation:  Sinus tachycardia Probable anteroseptal infarct,  recent Confirmed by Vishaal Strollo  MD, Shannah Conteh ([id number]) on 09/10/2014 8:06:25 PM      MDM   Final diagnoses:  Healthcare-associated pneumonia    Chest x-ray reveals right greater than left lower lobe consolidation suspicious for progressive pneumonia. Rx IV vancomycin, IV Maxipime. Admit. DO NOT RESUSCITATE    Donnetta HutchingBrian Orian Amberg, MD 09/10/14 [id number]  Donnetta HutchingBrian Elvi Leventhal, MD 09/10/14 2113

## 2014-09-10 NOTE — H&P (Signed)
[facility] History and Physical  [name] WGN:[id number] DOB: 02-01-1923 DOA: 09/10/2014  Referring physician: Dr Adriana Simas - [facility] PCP: Fredirick Maudlin, MD   Chief Complaint: SOB  HPI: [name] is a 81 y.o. female  Pt opresenting w/ SOB. O2 24/7 at [facility]. Decreased O2 sats at [facility] today. Per report this occurred after patient's oxygen was actually disconnected for a prolonged period of time she started to ambulate. O2 sats were as low as 67%. After O2 was reengaged oxygen saturation levels returned to the 90s. However nursing home staff decided to have patient evaluated in the emergency room. Of note patient has developed a wet cough today. Per family her shortness of breath is at baseline and other medical conditions are baseline. Patient has been on Levaquin since coming down with pneumonia in March. She was titrating down and was most recently on Levaquin 250 mg daily.  Of note patient also with right lateral ankle wound since January. Ongoing medical care through the wound care center.  Review of Systems:  Constitutional:  No weight loss, night sweats, Fevers, chills, fatigue.  HEENT:  No headaches, Difficulty swallowing,Tooth/dental problems,Sore throat,  No sneezing, itching, ear ache, nasal congestion, post nasal drip,  Cardio-vascular:  No chest pain, Orthopnea, PND, or extremity swelling at baseline. anasarca, dizziness, palpitations  GI:  No heartburn, indigestion, abdominal pain, nausea, vomiting, diarrhea, change in bowel habits, loss of appetite  Resp:  per history of present illness Skin:  Per history of present illness GU:  no dysuria, change in color of urine, no urgency or frequency. No flank pain.  Musculoskeletal:   No joint pain or swelling. No decreased range of motion. No back pain.  Psych:  No change in mood or affect. No depression or anxiety. No memory loss.   Past Medical History  Diagnosis Date  . Seizures   . Aspiration pneumonia   .  Dysphagia   . Osteoporosis   . Thyroid disease   . Depression   . MRSA (methicillin resistant staph aureus) culture positive   . Respiratory failure   . Hypokalemia   . Reflux   . Vertebral compression fracture     thoracic and lumbar  . History of sinus cancer 1970's    left side  . Chronic pain   . Hypertension   . GERD (gastroesophageal reflux disease)    Past Surgical History  Procedure Laterality Date  . Kyphoplasty      Lumbar and thoracic  . Palate surgery  1970's    removal of hard and soft palate on left side  . Enucleation Left 1970's    left eye  . Cholecystectomy    . Appendectomy    . Cesarean section     Social History:  reports that she has never smoked. She does not have any smokeless tobacco history on file. She reports that she does not drink alcohol or use illicit drugs.  Allergies  Allergen Reactions  . Aricept [Donepezil Hcl]   . Bactrim [Sulfamethoxazole-Trimethoprim]   . Ciprofloxacin   . Darvocet [Propoxyphene N-Acetaminophen]   . Namenda [Memantine Hcl]   . Penicillins   . Vibramycin [Doxycycline Calcium]     Family History  Problem Relation Age of Onset  . Cancer Father      Prior to Admission medications   Medication Sig Start Date End Date Taking? Authorizing Provider  Amino Acids-Protein Hydrolys (FEEDING SUPPLEMENT, PRO-STAT SUGAR FREE 64,) LIQD Take 30 mLs by mouth 2 (two) times daily.  Yes Historical Provider, MD  calcium-vitamin D (OSCAL WITH D) 500-200 MG-UNIT per tablet Take 1 tablet by mouth 3 (three) times daily. For osteoporosis   Yes Historical Provider, MD  Cranberry-Vitamin C-Probiotic (AZO CRANBERRY PO) Take 1 tablet by mouth 2 (two) times daily.   Yes Historical Provider, MD  docusate sodium (COLACE) 100 MG capsule Take 100 mg by mouth 2 (two) times daily. For constipation   Yes Historical Provider, MD  ENSURE PLUS (ENSURE PLUS) LIQD Take 237 mLs by mouth 2 (two) times daily between meals. Nutritional supplement   Yes  Historical Provider, MD  escitalopram (LEXAPRO) 10 MG tablet Take 10 mg by mouth daily. For depression   Yes Historical Provider, MD  furosemide (LASIX) 20 MG tablet Take 20 mg by mouth daily. For HTN/ edema   Yes Historical Provider, MD  hydroxypropyl methylcellulose / hypromellose (ISOPTO TEARS / GONIOVISC) 2.5 % ophthalmic solution Place 1 drop into the right eye as needed for dry eyes.   Yes Historical Provider, MD  iron polysaccharides (NIFEREX) 150 MG capsule Take 150 mg by mouth daily.   Yes Historical Provider, MD  lactobacillus acidophilus (BACID) TABS tablet Take 2 tablets by mouth daily.   Yes Historical Provider, MD  levETIRAcetam (KEPPRA) 250 MG tablet Take 250 mg by mouth every 12 (twelve) hours. For seizures   Yes Historical Provider, MD  levofloxacin (LEVAQUIN) 250 MG tablet Take 250 mg by mouth daily.   Yes Historical Provider, MD  levothyroxine (SYNTHROID, LEVOTHROID) 175 MCG tablet Take 175 mcg by mouth daily. For thyroid therapy   Yes Historical Provider, MD  loratadine (CLARITIN) 10 MG tablet Take 10 mg by mouth daily. For allergies   Yes Historical Provider, MD  metoprolol tartrate (LOPRESSOR) 25 MG tablet Take 25 mg by mouth daily. For HTN   Yes Historical Provider, MD  Multiple Vitamin (MULTIVITAMIN WITH MINERALS) TABS Take 1 tablet by mouth daily. For nutritional supplement   Yes Historical Provider, MD  ranitidine (ZANTAC) 150 MG tablet Take 150 mg by mouth at bedtime.   Yes Historical Provider, MD  rOPINIRole (REQUIP) 4 MG tablet Take 4 mg by mouth at bedtime. For parkinson disease   Yes Historical Provider, MD  sennosides-docusate sodium (SENOKOT-S) 8.6-50 MG tablet Take 1 tablet by mouth 2 (two) times daily. For constipation   Yes Historical Provider, MD  alum & mag hydroxide-simeth (MYLANTA) 200-200-20 MG/5ML suspension Take 30 mLs by mouth every 4 (four) hours as needed for indigestion or heartburn.    Historical Provider, MD  collagenase (SANTYL) ointment Apply  topically daily. Patient not taking: Reported on 09/10/2014 08/15/14   Kari Baars, MD  HYDROcodone-acetaminophen (NORCO/VICODIN) 5-325 MG per tablet Take one tablet by mouth three times daily and every 6 hours as needed for pain 01/26/14   Oneal Grout, MD  levalbuterol (XOPENEX) 0.63 MG/3ML nebulizer solution Take 0.63 mg by nebulization every 4 (four) hours as needed for wheezing or shortness of breath.    Historical Provider, MD  levofloxacin (LEVAQUIN) 500 MG tablet Take 1 tablet (500 mg total) by mouth daily. Patient not taking: Reported on 09/10/2014 08/15/14   Kari Baars, MD  magnesium hydroxide (MILK OF MAGNESIA) 400 MG/5ML suspension Take 30 mLs by mouth daily as needed for mild constipation.    Historical Provider, MD  methylPREDNIsolone (MEDROL DOSPACK) 4 MG tablet follow package directions Patient not taking: Reported on 09/10/2014 08/15/14   Kari Baars, MD  phenylephrine-shark liver oil-mineral oil-petrolatum (PREPARATION H) 0.25-3-14-71.9 % rectal ointment Place 1 application  rectally as needed for hemorrhoids.    Historical Provider, MD  zolpidem (AMBIEN) 5 MG tablet Take 5 mg by mouth at bedtime as needed for sleep. For insomnia    Historical Provider, MD   Physical Exam: Filed Vitals:   09/10/14 2100 09/10/14 2109 09/10/14 2130 09/10/14 2152  BP: 134/65  159/75   Pulse: 96  108   Temp:    101 F (38.3 C)  TempSrc:    Rectal  Resp: 18  28   Height:      Weight:      SpO2: 100% 97% 100%     Wt Readings from Last 3 Encounters:  09/10/14 40.824 kg (90 lb)  08/13/14 40.9 kg (90 lb 2.7 oz)  06/09/13 44.9 kg (98 lb 15.8 oz)    General:  Appears calm and comfortable Eyes: Left eye missing.  ENT:  grossly normal hearing, lips & tongue Cardiovascular:  RRR,  2/6 systolic murmur.  Trace bilateral lower extremity edema. Telemetry:  SR, no arrhythmias  Respiratory:  Decreased breath sounds bilaterally in the bases with crackles bilaterally. Normal effort. On O2 nasal  cannula.  Abdomen:  soft, ntnd Skin:  R ankle wound over the lateral malleolus  Musculoskeletal:  grossly normal tone BUE/BLE Psychiatric:  grossly normal mood and affect, speech fluent and appropriate Neurologic:  grossly non-focal.          Labs on Admission:  Basic Metabolic Panel:  Recent Labs Lab 09/05/14 0620 09/10/14 1945  NA 144 143  K 4.3 3.9  CL 99 96  CO2 43* 42*  GLUCOSE 88 129*  BUN 35* 27*  CREATININE 1.16* 1.06  CALCIUM 9.6 9.6   Liver Function Tests: No results for input(s): AST, ALT, ALKPHOS, BILITOT, PROT, ALBUMIN in the last 168 hours. No results for input(s): LIPASE, AMYLASE in the last 168 hours. No results for input(s): AMMONIA in the last 168 hours. CBC:  Recent Labs Lab 09/05/14 0620 09/10/14 1945  WBC 2.9* 3.5*  NEUTROABS 1.9  --   HGB 7.4* 8.3*  HCT 25.5* 28.7*  MCV 94.8 94.7  PLT 158 215   Cardiac Enzymes:  Recent Labs Lab 09/10/14 1945  TROPONINI 0.04*    BNP (last 3 results)  Recent Labs  08/11/14 2350 09/10/14 1945  BNP 212.0* 211.0*    ProBNP (last 3 results) No results for input(s): PROBNP in the last 8760 hours.  CBG: No results for input(s): GLUCAP in the last 168 hours.  Radiological Exams on Admission: Dg Chest Port 1 View  09/10/2014   CLINICAL DATA:  Shortness of breath, pneumonia  EXAM: PORTABLE CHEST - 1 VIEW  COMPARISON:  08/12/2014  FINDINGS: Interval increase in consolidation at the right lung base. Retrocardiac opacity is also identified. Patient's head obscures detail over the apices. Heart size is upper limits of normal. Evidence of vertebral augmentation. Chronic right humeral dislocation. Right apical skin fold simulates a pneumothorax.  IMPRESSION: Increased right greater than left lower lobe consolidation, suspicious for progressive pneumonia. Followup to radiographic resolution is recommended in 3-4 weeks to exclude underlying neoplasm.  Chronic right humeral dislocation.   Electronically Signed    By: Christiana Pellant M.D.   On: 09/10/2014 20:22     Assessment/Plan Principal Problem:   Healthcare-associated pneumonia Active Problems:   Convulsions/seizures   Hypothyroidism   Depression   Stasis leg ulcer   Essential hypertension   Insomnia   Protein calorie malnutrition   HCAP: Failed outpatient therapy. Patient on Levaquin taper and most  recently taking 250 mg once daily. Chest x-ray consistent with worsening ammonia especially in the right lower lobe. Afebrile vital signs stable, no increased O2 requirement. WBC 3.5. BNP 211. - MedSurg - Vancomycin, Aztreonam - O2 when necessary - Legionella and strep antigen - Sputum culture  Right lower extremity wound: chronic since January. Healthy granulation tissue present in base.  - Wound care - continue current duoderm bandage  Szr: no recent seizures - continue Keppra  Hypertension: - Continue metoprolol  Protein calorie malnutrition: - Continue home Ensure  Extremity edema: - Continue Lasix  Depression: - Continue Lexapro  Constipation: - continue home constipation regimen  Insomnia: - Continue Ambien  Hypothyroidism:  - Continue Synthroid  Code Status: DNR DVT Prophylaxis: Hep Family Communication: Daughters Disposition Plan: pending improvement  Jamesen Stahnke Shela CommonsJ, MD Family Medicine [facility] [email or web address] Password [email or web address]

## 2014-09-10 NOTE — ED Notes (Signed)
CRITICAL VALUE ALERT  Critical value received:  co2 42 Date of notification:  09/10/2014  Time of notification:  2044  Critical value read back:yes  Nurse who received alert: Ellery Plunkchama martin  MD notified (1st page):    Time of first page:    MD notified (2nd page):  Time of second page:  Responding MD:  cook  Time MD responded:  2044

## 2014-09-10 NOTE — ED Notes (Signed)
Pt has been brought in by [facility] pt from [facility]. Pt has been on 02 for the last month due to breathing problems. Pt is currently being treated with po meds for pneumonia. Pt was sitting in her chair at [facility] and her o2 tank went dry. Pts o2 was in the 70's initially and upon arrival to ed pt's o2 is 94 on 3L.

## 2014-09-11 LAB — COMPREHENSIVE METABOLIC PANEL
ALT: 8 U/L (ref 0–35)
AST: 21 U/L (ref 0–37)
Albumin: 2.6 g/dL — ABNORMAL LOW (ref 3.5–5.2)
Alkaline Phosphatase: 51 U/L (ref 39–117)
Anion gap: 7 (ref 5–15)
BUN: 31 mg/dL — ABNORMAL HIGH (ref 6–23)
CALCIUM: 9 mg/dL (ref 8.4–10.5)
CHLORIDE: 96 mmol/L (ref 96–112)
CO2: 39 mmol/L — AB (ref 19–32)
CREATININE: 1.12 mg/dL — AB (ref 0.50–1.10)
GFR calc Af Amer: 49 mL/min — ABNORMAL LOW (ref >90)
GFR calc non Af Amer: 42 mL/min — ABNORMAL LOW (ref >90)
Glucose, Bld: 112 mg/dL — ABNORMAL HIGH (ref 70–99)
Potassium: 4.2 mmol/L (ref 3.5–5.1)
Sodium: 142 mmol/L (ref 135–145)
Total Bilirubin: 0.4 mg/dL (ref 0.3–1.2)
Total Protein: 5.9 g/dL — ABNORMAL LOW (ref 6.0–8.3)

## 2014-09-11 LAB — CBC
HCT: 27.5 % — ABNORMAL LOW (ref 36.0–46.0)
Hemoglobin: 8.2 g/dL — ABNORMAL LOW (ref 12.0–15.0)
MCH: 27.8 pg (ref 26.0–34.0)
MCHC: 29.8 g/dL — ABNORMAL LOW (ref 30.0–36.0)
MCV: 93.2 fL (ref 78.0–100.0)
PLATELETS: 193 10*3/uL (ref 150–400)
RBC: 2.95 MIL/uL — ABNORMAL LOW (ref 3.87–5.11)
RDW: 13.3 % (ref 11.5–15.5)
WBC: 13.6 10*3/uL — AB (ref 4.0–10.5)

## 2014-09-11 LAB — MRSA PCR SCREENING: MRSA by PCR: POSITIVE — AB

## 2014-09-11 MED ORDER — MUPIROCIN 2 % EX OINT
1.0000 "application " | TOPICAL_OINTMENT | Freq: Two times a day (BID) | CUTANEOUS | Status: DC
  Administered 2014-09-11 – 2014-09-12 (×4): 1 via NASAL
  Filled 2014-09-11: qty 22

## 2014-09-11 MED ORDER — CETYLPYRIDINIUM CHLORIDE 0.05 % MT LIQD
7.0000 mL | Freq: Two times a day (BID) | OROMUCOSAL | Status: DC
  Administered 2014-09-11 – 2014-09-13 (×5): 7 mL via OROMUCOSAL

## 2014-09-11 MED ORDER — DEXTROSE 5 % IV SOLN
1.0000 g | INTRAVENOUS | Status: DC
  Administered 2014-09-11 – 2014-09-12 (×2): 1 g via INTRAVENOUS
  Filled 2014-09-11 (×3): qty 1

## 2014-09-11 MED ORDER — VANCOMYCIN HCL 500 MG IV SOLR
500.0000 mg | INTRAVENOUS | Status: DC
  Administered 2014-09-11 – 2014-09-12 (×2): 500 mg via INTRAVENOUS
  Filled 2014-09-11 (×3): qty 500

## 2014-09-11 MED ORDER — CHLORHEXIDINE GLUCONATE CLOTH 2 % EX PADS
6.0000 | MEDICATED_PAD | Freq: Every day | CUTANEOUS | Status: DC
  Administered 2014-09-11 – 2014-09-13 (×3): 6 via TOPICAL

## 2014-09-11 NOTE — Progress Notes (Signed)
Subjective: She was admitted with another episode of pneumonia. She has had several episodes. No other new complaints now. She was noted to have some difficulty swallowing by her nurse this morning.  Objective: Vital signs in last 24 hours: Temp:  [97.9 F (36.6 C)-101 F (38.3 C)] 97.9 F (36.6 C) (04/24 0557) Pulse Rate:  [96-108] 96 (04/24 0557) Resp:  [18-28] 18 (04/24 0557) BP: (114-167)/(48-75) 118/48 mmHg (04/24 0557) SpO2:  [90 %-100 %] 99 % (04/24 0557) Weight:  [40.824 kg (90 lb)-41.3 kg (91 lb 0.8 oz)] 41.3 kg (91 lb 0.8 oz) (04/23 2236) Weight change:  Last BM Date:  (family and pt unable to determine)  Intake/Output from previous day: 04/23 0701 - 04/24 0700 In: 480 [P.O.:480] Out: -   PHYSICAL EXAM General appearance: alert, cooperative and mild distress Resp: rhonchi bilaterally Cardio: regular rate and rhythm, S1, S2 normal, no murmur, click, rub or gallop GI: soft, non-tender; bowel sounds normal; no masses,  no organomegaly Extremities: extremities normal, atraumatic, no cyanosis or edema  Lab Results:  Results for orders placed or performed during the hospital encounter of 09/10/14 (from the past 48 hour(s))  Basic metabolic panel     Status: Abnormal   Collection Time: 09/10/14  7:45 PM  Result Value Ref Range   Sodium 143 135 - 145 mmol/L   Potassium 3.9 3.5 - 5.1 mmol/L   Chloride 96 96 - 112 mmol/L   CO2 42 (HH) 19 - 32 mmol/L    Comment: REPEATED TO VERIFY CRITICAL RESULT CALLED TO, READ BACK BY AND VERIFIED WITH: MARTIN,S AT 8:43PM ON 09/10/14 BY MOSLEY,J    Glucose, Bld 129 (H) 70 - 99 mg/dL   BUN 27 (H) 6 - 23 mg/dL   Creatinine, Ser 1.06 0.50 - 1.10 mg/dL   Calcium 9.6 8.4 - 10.5 mg/dL   GFR calc non Af Amer 45 (L) >90 mL/min   GFR calc Af Amer 52 (L) >90 mL/min    Comment: (NOTE) The eGFR has been calculated using the CKD EPI equation. This calculation has not been validated in all clinical situations. eGFR's persistently <90 mL/min  signify possible Chronic Kidney Disease.    Anion gap 5 5 - 15  CBC     Status: Abnormal   Collection Time: 09/10/14  7:45 PM  Result Value Ref Range   WBC 3.5 (L) 4.0 - 10.5 K/uL   RBC 3.03 (L) 3.87 - 5.11 MIL/uL   Hemoglobin 8.3 (L) 12.0 - 15.0 g/dL   HCT 28.7 (L) 36.0 - 46.0 %   MCV 94.7 78.0 - 100.0 fL   MCH 27.4 26.0 - 34.0 pg   MCHC 28.9 (L) 30.0 - 36.0 g/dL   RDW 13.2 11.5 - 15.5 %   Platelets 215 150 - 400 K/uL  BNP (order ONLY if patient complains of dyspnea/SOB AND you have documented it for THIS visit)     Status: Abnormal   Collection Time: 09/10/14  7:45 PM  Result Value Ref Range   B Natriuretic Peptide 211.0 (H) 0.0 - 100.0 pg/mL  Troponin I     Status: Abnormal   Collection Time: 09/10/14  7:45 PM  Result Value Ref Range   Troponin I 0.04 (H) <0.031 ng/mL    Comment:        PERSISTENTLY INCREASED TROPONIN VALUES IN THE RANGE OF 0.04-0.49 ng/mL CAN BE SEEN IN:       -UNSTABLE ANGINA       -CONGESTIVE HEART FAILURE       -  MYOCARDITIS       -CHEST TRAUMA       -ARRYHTHMIAS       -LATE PRESENTING MYOCARDIAL INFARCTION       -COPD   CLINICAL FOLLOW-UP RECOMMENDED.   Culture, blood (routine x 2) Call MD if unable to obtain prior to antibiotics being given     Status: None (Preliminary result)   Collection Time: 09/10/14 11:03 PM  Result Value Ref Range   Specimen Description LEFT ANTECUBITAL    Special Requests BOTTLES DRAWN AEROBIC ONLY 6CC    Culture PENDING    Report Status PENDING   Culture, blood (routine x 2) Call MD if unable to obtain prior to antibiotics being given     Status: None (Preliminary result)   Collection Time: 09/10/14 11:08 PM  Result Value Ref Range   Specimen Description LEFT ANTECUBITAL    Special Requests BOTTLES DRAWN AEROBIC ONLY [place]    Culture PENDING    Report Status PENDING   MRSA PCR Screening     Status: Abnormal   Collection Time: 09/11/14 12:50 AM  Result Value Ref Range   MRSA by PCR POSITIVE (A) NEGATIVE    Comment:         The GeneXpert MRSA Assay (FDA approved for NASAL specimens only), is one component of a comprehensive MRSA colonization surveillance program. It is not intended to diagnose MRSA infection nor to guide or monitor treatment for MRSA infections. RESULT CALLED TO, READ BACK BY AND VERIFIED WITH:  THOMAS,K @ 1224 ON 09/11/14 BY WOODIE,J   CBC     Status: Abnormal   Collection Time: 09/11/14  6:30 AM  Result Value Ref Range   WBC 13.6 (H) 4.0 - 10.5 K/uL   RBC 2.95 (L) 3.87 - 5.11 MIL/uL   Hemoglobin 8.2 (L) 12.0 - 15.0 g/dL   HCT 27.5 (L) 36.0 - 46.0 %   MCV 93.2 78.0 - 100.0 fL   MCH 27.8 26.0 - 34.0 pg   MCHC 29.8 (L) 30.0 - 36.0 g/dL   RDW 13.3 11.5 - 15.5 %   Platelets 193 150 - 400 K/uL  Comprehensive metabolic panel     Status: Abnormal   Collection Time: 09/11/14  6:30 AM  Result Value Ref Range   Sodium 142 135 - 145 mmol/L   Potassium 4.2 3.5 - 5.1 mmol/L   Chloride 96 96 - 112 mmol/L   CO2 39 (H) 19 - 32 mmol/L   Glucose, Bld 112 (H) 70 - 99 mg/dL   BUN 31 (H) 6 - 23 mg/dL   Creatinine, Ser 1.12 (H) 0.50 - 1.10 mg/dL   Calcium 9.0 8.4 - 10.5 mg/dL   Total Protein 5.9 (L) 6.0 - 8.3 g/dL   Albumin 2.6 (L) 3.5 - 5.2 g/dL   AST 21 0 - 37 U/L   ALT 8 0 - 35 U/L   Alkaline Phosphatase 51 39 - 117 U/L   Total Bilirubin 0.4 0.3 - 1.2 mg/dL   GFR calc non Af Amer 42 (L) >90 mL/min   GFR calc Af Amer 49 (L) >90 mL/min    Comment: (NOTE) The eGFR has been calculated using the CKD EPI equation. This calculation has not been validated in all clinical situations. eGFR's persistently <90 mL/min signify possible Chronic Kidney Disease.    Anion gap 7 5 - 15    ABGS No results for input(s): PHART, PO2ART, TCO2, HCO3 in the last 72 hours.  Invalid input(s): PCO2 CULTURES Recent Results (from the  past 240 hour(s))  Culture, Urine     Status: None   Collection Time: 09/01/14  8:10 PM  Result Value Ref Range Status   Specimen Description URINE, RANDOM  Final    Special Requests NONE  Final   Colony Count NO GROWTH Performed at [facility]   Final   Culture NO GROWTH Performed at [facility]   Final   Report Status 09/03/2014 FINAL  Final  Culture, blood (routine x 2) Call MD if unable to obtain prior to antibiotics being given     Status: None (Preliminary result)   Collection Time: 09/10/14 11:03 PM  Result Value Ref Range Status   Specimen Description LEFT ANTECUBITAL  Final   Special Requests BOTTLES DRAWN AEROBIC ONLY [place]  Final   Culture PENDING  Incomplete   Report Status PENDING  Incomplete  Culture, blood (routine x 2) Call MD if unable to obtain prior to antibiotics being given     Status: None (Preliminary result)   Collection Time: 09/10/14 11:08 PM  Result Value Ref Range Status   Specimen Description LEFT ANTECUBITAL  Final   Special Requests BOTTLES DRAWN AEROBIC ONLY [place]  Final   Culture PENDING  Incomplete   Report Status PENDING  Incomplete  MRSA PCR Screening     Status: Abnormal   Collection Time: 09/11/14 12:50 AM  Result Value Ref Range Status   MRSA by PCR POSITIVE (A) NEGATIVE Final    Comment:        The GeneXpert MRSA Assay (FDA approved for NASAL specimens only), is one component of a comprehensive MRSA colonization surveillance program. It is not intended to diagnose MRSA infection nor to guide or monitor treatment for MRSA infections. RESULT CALLED TO, READ BACK BY AND VERIFIED WITH:  THOMAS,K @ 2841 ON 09/11/14 BY [place] Planas    Studies/Results: Dg Chest Port 1 View  09/10/2014   CLINICAL DATA:  Shortness of breath, pneumonia  EXAM: PORTABLE CHEST - 1 VIEW  COMPARISON:  08/12/2014  FINDINGS: Interval increase in consolidation at the right lung base. Retrocardiac opacity is also identified. Patient's head obscures detail over the apices. Heart size is upper limits of normal. Evidence of vertebral augmentation. Chronic right humeral dislocation. Right apical skin fold simulates a  pneumothorax.  IMPRESSION: Increased right greater than left lower lobe consolidation, suspicious for progressive pneumonia. Followup to radiographic resolution is recommended in 3-4 weeks to exclude underlying neoplasm.  Chronic right humeral dislocation.   Electronically Signed   By: Conchita Paris M.D.   On: 09/10/2014 20:22    Medications:  Prior to Admission:  Prescriptions prior to admission  Medication Sig Dispense Refill Last Dose  . Amino Acids-Protein Hydrolys (FEEDING SUPPLEMENT, PRO-STAT SUGAR FREE 64,) LIQD Take 30 mLs by mouth 2 (two) times daily.   09/10/2014  . calcium-vitamin D (OSCAL WITH D) 500-200 MG-UNIT per tablet Take 1 tablet by mouth 3 (three) times daily. For osteoporosis   09/10/2014  . Cranberry-Vitamin C-Probiotic (AZO CRANBERRY PO) Take 1 tablet by mouth 2 (two) times daily.   09/10/2014  . docusate sodium (COLACE) 100 MG capsule Take 100 mg by mouth 2 (two) times daily. For constipation   09/10/2014  . ENSURE PLUS (ENSURE PLUS) LIQD Take 237 mLs by mouth 2 (two) times daily between meals. Nutritional supplement   09/10/2014  . escitalopram (LEXAPRO) 10 MG tablet Take 10 mg by mouth daily. For depression   09/10/2014  . furosemide (LASIX) 20 MG tablet Take 20 mg  by mouth daily. For HTN/ edema   09/10/2014  . hydroxypropyl methylcellulose / hypromellose (ISOPTO TEARS / GONIOVISC) 2.5 % ophthalmic solution Place 1 drop into the right eye as needed for dry eyes.   Unknown  . iron polysaccharides (NIFEREX) 150 MG capsule Take 150 mg by mouth daily.   09/10/2014  . lactobacillus acidophilus (BACID) TABS tablet Take 2 tablets by mouth daily.   09/10/2014  . levETIRAcetam (KEPPRA) 250 MG tablet Take 250 mg by mouth every 12 (twelve) hours. For seizures   09/10/2014  . levofloxacin (LEVAQUIN) 250 MG tablet Take 250 mg by mouth daily.   09/10/2014  . levothyroxine (SYNTHROID, LEVOTHROID) 175 MCG tablet Take 175 mcg by mouth daily. For thyroid therapy   09/10/2014  . loratadine  (CLARITIN) 10 MG tablet Take 10 mg by mouth daily. For allergies   09/10/2014  . metoprolol tartrate (LOPRESSOR) 25 MG tablet Take 25 mg by mouth daily. For HTN   09/10/2014 at 0800  . Multiple Vitamin (MULTIVITAMIN WITH MINERALS) TABS Take 1 tablet by mouth daily. For nutritional supplement   09/10/2014  . ranitidine (ZANTAC) 150 MG tablet Take 150 mg by mouth at bedtime.   09/09/2014  . rOPINIRole (REQUIP) 4 MG tablet Take 4 mg by mouth at bedtime. For parkinson disease   09/09/2014  . sennosides-docusate sodium (SENOKOT-S) 8.6-50 MG tablet Take 1 tablet by mouth 2 (two) times daily. For constipation   09/10/2014  . alum & mag hydroxide-simeth (MYLANTA) 200-200-20 MG/5ML suspension Take 30 mLs by mouth every 4 (four) hours as needed for indigestion or heartburn.   Unknown  . collagenase (SANTYL) ointment Apply topically daily. (Patient not taking: Reported on 09/10/2014) 15 g 0   . HYDROcodone-acetaminophen (NORCO/VICODIN) 5-325 MG per tablet Take one tablet by mouth three times daily and every 6 hours as needed for pain 210 tablet 0 Unknown  . levalbuterol (XOPENEX) 0.63 MG/3ML nebulizer solution Take 0.63 mg by nebulization every 4 (four) hours as needed for wheezing or shortness of breath.   Unknown  . levofloxacin (LEVAQUIN) 500 MG tablet Take 1 tablet (500 mg total) by mouth daily. (Patient not taking: Reported on 09/10/2014)     . magnesium hydroxide (MILK OF MAGNESIA) 400 MG/5ML suspension Take 30 mLs by mouth daily as needed for mild constipation.   Unknown  . methylPREDNIsolone (MEDROL DOSPACK) 4 MG tablet follow package directions (Patient not taking: Reported on 09/10/2014) 21 tablet 0   . phenylephrine-shark liver oil-mineral oil-petrolatum (PREPARATION H) 0.25-3-14-71.9 % rectal ointment Place 1 application rectally as needed for hemorrhoids.   Unknown  . zolpidem (AMBIEN) 5 MG tablet Take 5 mg by mouth at bedtime as needed for sleep. For insomnia   Unknown   Scheduled: . acidophilus  2  capsule Oral Daily  . antiseptic oral rinse  7 mL Mouth Rinse BID  . Chlorhexidine Gluconate Cloth  6 each Topical Q0600  . docusate sodium  100 mg Oral BID  . escitalopram  10 mg Oral Daily  . feeding supplement (ENSURE ENLIVE)  237 mL Oral BID BM  . furosemide  20 mg Oral Daily  . heparin  5,000 Units Subcutaneous 3 times per day  . levETIRAcetam  250 mg Oral Q12H  . levothyroxine  175 mcg Oral QAC breakfast  . metoprolol tartrate  25 mg Oral Daily  . mupirocin ointment  1 application Nasal BID  . rOPINIRole  4 mg Oral QHS  . senna-docusate  1 tablet Oral BID  . vancomycin  500 mg Intravenous Q24H   Continuous:  ZTA:[id number] **OR** acetaminophen, HYDROcodone-acetaminophen, levalbuterol, ondansetron **OR** ondansetron (ZOFRAN) IV, zolpidem  Assesment: She is admitted with healthcare associated pneumonia. She is on appropriate antibiotics. She has multiple other problems including a stasis ulcer on her leg. There is some question of osteomyelitis but she is on long-term oral antibiotics. At her last admission I discussed the possibility of long-term IV antibiotics with a PICC line with family and we all agreed to see which she did with long-term by mouth antibiotics at least for now  She may be having swallowing problems. Principal Problem:   Healthcare-associated pneumonia Active Problems:   Convulsions/seizures   Hypothyroidism   Depression   Stasis leg ulcer   Essential hypertension   Insomnia   Protein calorie malnutrition    Plan: Continue current treatments. Request speech evaluation.    LOS: 1 day   Fabiano Ginley L 09/11/2014, 9:53 AM

## 2014-09-11 NOTE — Progress Notes (Addendum)
Unable to obtain urine sample. Pt had two incontinent episodes. Will attempt to collect urine sample and notify first shift nurse.

## 2014-09-11 NOTE — Progress Notes (Signed)
Notified MD pt's MRSA swab came back positive. Pt was placed on contact precautions per protocol. Nurse also notified MD per pharmacy request about antibiotics. Pharmacist stated she was concerned about all the antibiotics the pt was receiving (Vancomycin, Maxipime, and Azactam). Pharmacist asked if the Azactam was still necessary since pt received other two antibiotics. MD stated to leave orders as they are, and the morning MD can adjust as needed. MD phoned and did not give any new orders at this time. Will continue to monitor pt.

## 2014-09-11 NOTE — Progress Notes (Addendum)
ANTIBIOTIC CONSULT NOTE - INITIAL  Pharmacy Consult for Vancomycin and Cefepime Indication: pneumonia  Allergies  Allergen Reactions  . Aricept [Donepezil Hcl]   . Bactrim [Sulfamethoxazole-Trimethoprim]   . Ciprofloxacin   . Darvocet [Propoxyphene N-Acetaminophen]   . Namenda [Memantine Hcl]   . Penicillins   . Vibramycin [Doxycycline Calcium]    Patient Measurements: Height: 5' (152.4 cm) Weight: 91 lb 0.8 oz (41.3 kg) IBW/kg (Calculated) : 45.5  Vital Signs: Temp: 97.9 F (36.6 C) (04/24 0557) Temp Source: Oral (04/24 0557) BP: 118/48 mmHg (04/24 0557) Pulse Rate: 96 (04/24 0557) Intake/Output from previous day: 04/23 0701 - 04/24 0700 In: 480 [P.O.:480] Out: -  Intake/Output from this shift:    Labs:  Recent Labs  09/10/14 1945 09/11/14 0630  WBC 3.5* 13.6*  HGB 8.3* 8.2*  PLT 215 193  CREATININE 1.06 1.12*   Estimated Creatinine Clearance: 22.2 mL/min (by C-G formula based on Cr of 1.12). No results for input(s): VANCOTROUGH, VANCOPEAK, VANCORANDOM, GENTTROUGH, GENTPEAK, GENTRANDOM, TOBRATROUGH, TOBRAPEAK, TOBRARND, AMIKACINPEAK, AMIKACINTROU, AMIKACIN in the last 72 hours.   Microbiology: Recent Results (from the past 720 hour(s))  Culture, Urine     Status: None   Collection Time: 09/01/14  8:10 PM  Result Value Ref Range Status   Specimen Description URINE, RANDOM  Final   Special Requests NONE  Final   Colony Count NO GROWTH [facility]formed at [facility]   Final   Culture NO GROWTH [facility]formed at [facility]   Final   Report Status 09/03/2014 FINAL  Final  Culture, blood (routine x 2) Call MD if unable to obtain prior to antibiotics being given     Status: None (Preliminary result)   Collection Time: 09/10/14 11:03 PM  Result Value Ref Range Status   Specimen Description LEFT ANTECUBITAL  Final   Special Requests BOTTLES DRAWN AEROBIC ONLY 6CC  Final   Culture PENDING  Incomplete   Report Status PENDING  Incomplete  Culture,  blood (routine x 2) Call MD if unable to obtain prior to antibiotics being given     Status: None (Preliminary result)   Collection Time: 09/10/14 11:08 PM  Result Value Ref Range Status   Specimen Description LEFT ANTECUBITAL  Final   Special Requests BOTTLES DRAWN AEROBIC ONLY 6CC  Final   Culture PENDING  Incomplete   Report Status PENDING  Incomplete  MRSA PCR Screening     Status: Abnormal   Collection Time: 09/11/14 12:50 AM  Result Value Ref Range Status   MRSA by PCR POSITIVE (A) NEGATIVE Final    Comment:        The GeneXpert MRSA Assay (FDA approved for NASAL specimens only), is one component of a comprehensive MRSA colonization surveillance program. It is not intended to diagnose MRSA infection nor to guide or monitor treatment for MRSA infections. RESULT CALLED TO, READ BACK BY AND VERIFIED WITH:  THOMAS,K @ 0235 ON 09/11/14 BY WOODIE,J    Medical History: Past Medical History  Diagnosis Date  . Seizures   . Aspiration pneumonia   . Dysphagia   . Osteoporosis   . Thyroid disease   . Depression   . MRSA (methicillin resistant staph aureus) culture positive   . Respiratory failure   . Hypokalemia   . Reflux   . Vertebral compression fracture     thoracic and lumbar  . History of sinus cancer 1970's    left side  . Chronic pain   . Hypertension   . GERD (  gastroesophageal reflux disease)    Anti-infectives    Start     Dose/Rate Route Frequency Ordered Stop   09/11/14 1800  vancomycin (VANCOCIN) 500 mg in sodium chloride 0.9 % 100 mL IVPB     500 mg 100 mL/hr over 60 Minutes Intravenous Every 24 hours 09/11/14 0858     09/11/14 1000  aztreonam (AZACTAM) 1 g in dextrose 5 % 50 mL IVPB     1 g 100 mL/hr over 30 Minutes Intravenous 3 times per day 09/10/14 2305 09/19/14 0959   09/11/14 0600  aztreonam (AZACTAM) 2 g in dextrose 5 % 50 mL IVPB  Status:  Discontinued     2 g 100 mL/hr over 30 Minutes Intravenous 3 times per day 09/10/14 2223 09/10/14 2305    09/10/14 2200  vancomycin (VANCOCIN) IVPB 1000 mg/200 mL premix     1,000 mg 200 mL/hr over 60 Minutes Intravenous  Once 09/10/14 2118 09/10/14 2250   09/10/14 2130  ceFEPIme (MAXIPIME) 2 g in dextrose 5 % 50 mL IVPB     2 g 100 mL/hr over 30 Minutes Intravenous  Once 09/10/14 2118 09/11/14 0026     Assessment: 81yo female with small body habitus.  Admitted with respiratory failure and suspected pneumonia.  Vancomycin and Cefepime given on admission in ED. No adverse reactions noted.  Pt tolerated Cefepime. (PCN allergy documented) Estimated ClCr ~ 25-30 but difficult to accurately assess given age and small body habitus.  Monitor SCr closely.  D/W Dr Juanetta Gosling, will continue Cefepime and d/c Aztreonam.  Goal of Therapy:  Vancomycin trough level 15-20 mcg/ml  Plan:  Vancomycin  IV q24hrs Check trough at steady state Cefepime 1gm IV q24hrs Monitor labs, renal fxn, progress, and cultures  Valrie Hart A 09/11/2014,8:59 AM

## 2014-09-12 ENCOUNTER — Inpatient Hospital Stay (HOSPITAL_COMMUNITY): Payer: Medicare Other

## 2014-09-12 LAB — BASIC METABOLIC PANEL
ANION GAP: 8 (ref 5–15)
BUN: 33 mg/dL — AB (ref 6–23)
CHLORIDE: 97 mmol/L (ref 96–112)
CO2: 38 mmol/L — ABNORMAL HIGH (ref 19–32)
Calcium: 9 mg/dL (ref 8.4–10.5)
Creatinine, Ser: 1.21 mg/dL — ABNORMAL HIGH (ref 0.50–1.10)
GFR calc Af Amer: 45 mL/min — ABNORMAL LOW (ref >90)
GFR calc non Af Amer: 38 mL/min — ABNORMAL LOW (ref >90)
GLUCOSE: 121 mg/dL — AB (ref 70–99)
Potassium: 4.4 mmol/L (ref 3.5–5.1)
Sodium: 143 mmol/L (ref 135–145)

## 2014-09-12 LAB — HIV ANTIBODY (ROUTINE TESTING W REFLEX): HIV SCREEN 4TH GENERATION: NONREACTIVE

## 2014-09-12 NOTE — Progress Notes (Signed)
Subjective: She feels better and wants to go back to her skilled care facility. She had occupational therapy consultation ordered but I don't think she needs that. She is clearer than she was before  Objective: Vital signs in last 24 hours: Temp:  [98 F (36.7 C)-98.4 F (36.9 C)] 98.4 F (36.9 C) (04/25 0614) Pulse Rate:  [63-97] 97 (04/25 0614) Resp:  [20] 20 (04/25 0614) BP: (89-105)/(40-48) 105/40 mmHg (04/25 0614) SpO2:  [95 %-100 %] 97 % (04/25 5027) Weight change:  Last BM Date: 09/10/14  Intake/Output from previous day: 04/24 0701 - 04/25 0700 In: 310 [P.O.:160; IV Piggyback:150] Out: 2 [Urine:2]  PHYSICAL EXAM General appearance: alert, cooperative and mild distress Resp: rhonchi bilaterally Cardio: regular rate and rhythm, S1, S2 normal, no murmur, click, rub or gallop GI: soft, non-tender; bowel sounds normal; no masses,  no organomegaly Extremities: The ankle ulcer looks a little better  Lab Results:  Results for orders placed or performed during the hospital encounter of 09/10/14 (from the past 48 hour(s))  Basic metabolic panel     Status: Abnormal   Collection Time: 09/10/14  7:45 PM  Result Value Ref Range   Sodium 143 135 - 145 mmol/L   Potassium 3.9 3.5 - 5.1 mmol/L   Chloride 96 96 - 112 mmol/L   CO2 42 (HH) 19 - 32 mmol/L    Comment: REPEATED TO VERIFY CRITICAL RESULT CALLED TO, READ BACK BY AND VERIFIED WITH: MARTIN,S AT 8:43PM ON 09/10/14 BY MOSLEY,J    Glucose, Bld 129 (H) 70 - 99 mg/dL   BUN 27 (H) 6 - 23 mg/dL   Creatinine, Ser 1.06 0.50 - 1.10 mg/dL   Calcium 9.6 8.4 - 10.5 mg/dL   GFR calc non Af Amer 45 (L) >90 mL/min   GFR calc Af Amer 52 (L) >90 mL/min    Comment: (NOTE) The eGFR has been calculated using the CKD EPI equation. This calculation has not been validated in all clinical situations. eGFR's persistently <90 mL/min signify possible Chronic Kidney Disease.    Anion gap 5 5 - 15  CBC     Status: Abnormal   Collection Time:  09/10/14  7:45 PM  Result Value Ref Range   WBC 3.5 (L) 4.0 - 10.5 K/uL   RBC 3.03 (L) 3.87 - 5.11 MIL/uL   Hemoglobin 8.3 (L) 12.0 - 15.0 g/dL   HCT 28.7 (L) 36.0 - 46.0 %   MCV 94.7 78.0 - 100.0 fL   MCH 27.4 26.0 - 34.0 pg   MCHC 28.9 (L) 30.0 - 36.0 g/dL   RDW 13.2 11.5 - 15.5 %   Platelets 215 150 - 400 K/uL  BNP (order ONLY if patient complains of dyspnea/SOB AND you have documented it for THIS visit)     Status: Abnormal   Collection Time: 09/10/14  7:45 PM  Result Value Ref Range   B Natriuretic Peptide 211.0 (H) 0.0 - 100.0 pg/mL  Troponin I     Status: Abnormal   Collection Time: 09/10/14  7:45 PM  Result Value Ref Range   Troponin I 0.04 (H) <0.031 ng/mL    Comment:        PERSISTENTLY INCREASED TROPONIN VALUES IN THE RANGE OF 0.04-0.49 ng/mL CAN BE SEEN IN:       -UNSTABLE ANGINA       -CONGESTIVE HEART FAILURE       -MYOCARDITIS       -CHEST TRAUMA       -ARRYHTHMIAS       -  LATE PRESENTING MYOCARDIAL INFARCTION       -COPD   CLINICAL FOLLOW-UP RECOMMENDED.   Culture, blood (routine x 2) Call MD if unable to obtain prior to antibiotics being given     Status: None (Preliminary result)   Collection Time: 09/10/14 11:03 PM  Result Value Ref Range   Specimen Description LEFT ANTECUBITAL    Special Requests BOTTLES DRAWN AEROBIC ONLY 6CC    Culture NO GROWTH 1 DAY    Report Status PENDING   Culture, blood (routine x 2) Call MD if unable to obtain prior to antibiotics being given     Status: None (Preliminary result)   Collection Time: 09/10/14 11:08 PM  Result Value Ref Range   Specimen Description LEFT ANTECUBITAL    Special Requests BOTTLES DRAWN AEROBIC ONLY 6CC    Culture NO GROWTH 1 DAY    Report Status PENDING   MRSA PCR Screening     Status: Abnormal   Collection Time: 09/11/14 12:50 AM  Result Value Ref Range   MRSA by PCR POSITIVE (A) NEGATIVE    Comment:        The GeneXpert MRSA Assay (FDA approved for NASAL specimens only), is one component of  a comprehensive MRSA colonization surveillance program. It is not intended to diagnose MRSA infection nor to guide or monitor treatment for MRSA infections. RESULT CALLED TO, READ BACK BY AND VERIFIED WITH:  THOMAS,K @ 2035 ON 09/11/14 BY WOODIE,J   CBC     Status: Abnormal   Collection Time: 09/11/14  6:30 AM  Result Value Ref Range   WBC 13.6 (H) 4.0 - 10.5 K/uL   RBC 2.95 (L) 3.87 - 5.11 MIL/uL   Hemoglobin 8.2 (L) 12.0 - 15.0 g/dL   HCT 27.5 (L) 36.0 - 46.0 %   MCV 93.2 78.0 - 100.0 fL   MCH 27.8 26.0 - 34.0 pg   MCHC 29.8 (L) 30.0 - 36.0 g/dL   RDW 13.3 11.5 - 15.5 %   Platelets 193 150 - 400 K/uL  Comprehensive metabolic panel     Status: Abnormal   Collection Time: 09/11/14  6:30 AM  Result Value Ref Range   Sodium 142 135 - 145 mmol/L   Potassium 4.2 3.5 - 5.1 mmol/L   Chloride 96 96 - 112 mmol/L   CO2 39 (H) 19 - 32 mmol/L   Glucose, Bld 112 (H) 70 - 99 mg/dL   BUN 31 (H) 6 - 23 mg/dL   Creatinine, Ser 1.12 (H) 0.50 - 1.10 mg/dL   Calcium 9.0 8.4 - 10.5 mg/dL   Total Protein 5.9 (L) 6.0 - 8.3 g/dL   Albumin 2.6 (L) 3.5 - 5.2 g/dL   AST 21 0 - 37 U/L   ALT 8 0 - 35 U/L   Alkaline Phosphatase 51 39 - 117 U/L   Total Bilirubin 0.4 0.3 - 1.2 mg/dL   GFR calc non Af Amer 42 (L) >90 mL/min   GFR calc Af Amer 49 (L) >90 mL/min    Comment: (NOTE) The eGFR has been calculated using the CKD EPI equation. This calculation has not been validated in all clinical situations. eGFR's persistently <90 mL/min signify possible Chronic Kidney Disease.    Anion gap 7 5 - 15  Basic metabolic panel     Status: Abnormal   Collection Time: 09/12/14  4:57 AM  Result Value Ref Range   Sodium 143 135 - 145 mmol/L   Potassium 4.4 3.5 - 5.1 mmol/L   Chloride  97 96 - 112 mmol/L   CO2 38 (H) 19 - 32 mmol/L   Glucose, Bld 121 (H) 70 - 99 mg/dL   BUN 33 (H) 6 - 23 mg/dL   Creatinine, Ser 1.21 (H) 0.50 - 1.10 mg/dL   Calcium 9.0 8.4 - 10.5 mg/dL   GFR calc non Af Amer 38 (L) >90  mL/min   GFR calc Af Amer 45 (L) >90 mL/min    Comment: (NOTE) The eGFR has been calculated using the CKD EPI equation. This calculation has not been validated in all clinical situations. eGFR's persistently <90 mL/min signify possible Chronic Kidney Disease.    Anion gap 8 5 - 15    ABGS No results for input(s): PHART, PO2ART, TCO2, HCO3 in the last 72 hours.  Invalid input(s): PCO2 CULTURES Recent Results (from the past 240 hour(s))  Culture, blood (routine x 2) Call MD if unable to obtain prior to antibiotics being given     Status: None (Preliminary result)   Collection Time: 09/10/14 11:03 PM  Result Value Ref Range Status   Specimen Description LEFT ANTECUBITAL  Final   Special Requests BOTTLES DRAWN AEROBIC ONLY 6CC  Final   Culture NO GROWTH 1 DAY  Final   Report Status PENDING  Incomplete  Culture, blood (routine x 2) Call MD if unable to obtain prior to antibiotics being given     Status: None (Preliminary result)   Collection Time: 09/10/14 11:08 PM  Result Value Ref Range Status   Specimen Description LEFT ANTECUBITAL  Final   Special Requests BOTTLES DRAWN AEROBIC ONLY [place]  Final   Culture NO GROWTH 1 DAY  Final   Report Status PENDING  Incomplete  MRSA PCR Screening     Status: Abnormal   Collection Time: 09/11/14 12:50 AM  Result Value Ref Range Status   MRSA by PCR POSITIVE (A) NEGATIVE Final    Comment:        The GeneXpert MRSA Assay (FDA approved for NASAL specimens only), is one component of a comprehensive MRSA colonization surveillance program. It is not intended to diagnose MRSA infection nor to guide or monitor treatment for MRSA infections. RESULT CALLED TO, READ BACK BY AND VERIFIED WITH:  THOMAS,K @ 9030 ON 09/11/14 BY [place] Planas    Studies/Results: Dg Chest Port 1 View  09/10/2014   CLINICAL DATA:  Shortness of breath, pneumonia  EXAM: PORTABLE CHEST - 1 VIEW  COMPARISON:  08/12/2014  FINDINGS: Interval increase in consolidation at the right  lung base. Retrocardiac opacity is also identified. Patient's head obscures detail over the apices. Heart size is upper limits of normal. Evidence of vertebral augmentation. Chronic right humeral dislocation. Right apical skin fold simulates a pneumothorax.  IMPRESSION: Increased right greater than left lower lobe consolidation, suspicious for progressive pneumonia. Followup to radiographic resolution is recommended in 3-4 weeks to exclude underlying neoplasm.  Chronic right humeral dislocation.   Electronically Signed   By: Conchita Paris M.D.   On: 09/10/2014 20:22    Medications:  Prior to Admission:  Prescriptions prior to admission  Medication Sig Dispense Refill Last Dose  . Amino Acids-Protein Hydrolys (FEEDING SUPPLEMENT, PRO-STAT SUGAR FREE 64,) LIQD Take 30 mLs by mouth 2 (two) times daily.   09/10/2014  . calcium-vitamin D (OSCAL WITH D) 500-200 MG-UNIT per tablet Take 1 tablet by mouth 3 (three) times daily. For osteoporosis   09/10/2014  . Cranberry-Vitamin C-Probiotic (AZO CRANBERRY PO) Take 1 tablet by mouth 2 (two) times daily.  09/10/2014  . docusate sodium (COLACE) 100 MG capsule Take 100 mg by mouth 2 (two) times daily. For constipation   09/10/2014  . ENSURE PLUS (ENSURE PLUS) LIQD Take 237 mLs by mouth 2 (two) times daily between meals. Nutritional supplement   09/10/2014  . escitalopram (LEXAPRO) 10 MG tablet Take 10 mg by mouth daily. For depression   09/10/2014  . furosemide (LASIX) 20 MG tablet Take 20 mg by mouth daily. For HTN/ edema   09/10/2014  . hydroxypropyl methylcellulose / hypromellose (ISOPTO TEARS / GONIOVISC) 2.5 % ophthalmic solution Place 1 drop into the right eye as needed for dry eyes.   Unknown  . iron polysaccharides (NIFEREX) 150 MG capsule Take 150 mg by mouth daily.   09/10/2014  . lactobacillus acidophilus (BACID) TABS tablet Take 2 tablets by mouth daily.   09/10/2014  . levETIRAcetam (KEPPRA) 250 MG tablet Take 250 mg by mouth every 12 (twelve) hours. For  seizures   09/10/2014  . levofloxacin (LEVAQUIN) 250 MG tablet Take 250 mg by mouth daily.   09/10/2014  . levothyroxine (SYNTHROID, LEVOTHROID) 175 MCG tablet Take 175 mcg by mouth daily. For thyroid therapy   09/10/2014  . loratadine (CLARITIN) 10 MG tablet Take 10 mg by mouth daily. For allergies   09/10/2014  . metoprolol tartrate (LOPRESSOR) 25 MG tablet Take 25 mg by mouth daily. For HTN   09/10/2014 at 0800  . Multiple Vitamin (MULTIVITAMIN WITH MINERALS) TABS Take 1 tablet by mouth daily. For nutritional supplement   09/10/2014  . ranitidine (ZANTAC) 150 MG tablet Take 150 mg by mouth at bedtime.   09/09/2014  . rOPINIRole (REQUIP) 4 MG tablet Take 4 mg by mouth at bedtime. For parkinson disease   09/09/2014  . sennosides-docusate sodium (SENOKOT-S) 8.6-50 MG tablet Take 1 tablet by mouth 2 (two) times daily. For constipation   09/10/2014  . alum & mag hydroxide-simeth (MYLANTA) 200-200-20 MG/5ML suspension Take 30 mLs by mouth every 4 (four) hours as needed for indigestion or heartburn.   Unknown  . collagenase (SANTYL) ointment Apply topically daily. (Patient not taking: Reported on 09/10/2014) 15 g 0   . HYDROcodone-acetaminophen (NORCO/VICODIN) 5-325 MG per tablet Take one tablet by mouth three times daily and every 6 hours as needed for pain 210 tablet 0 Unknown  . levalbuterol (XOPENEX) 0.63 MG/3ML nebulizer solution Take 0.63 mg by nebulization every 4 (four) hours as needed for wheezing or shortness of breath.   Unknown  . levofloxacin (LEVAQUIN) 500 MG tablet Take 1 tablet (500 mg total) by mouth daily. (Patient not taking: Reported on 09/10/2014)     . magnesium hydroxide (MILK OF MAGNESIA) 400 MG/5ML suspension Take 30 mLs by mouth daily as needed for mild constipation.   Unknown  . methylPREDNIsolone (MEDROL DOSPACK) 4 MG tablet follow package directions (Patient not taking: Reported on 09/10/2014) 21 tablet 0   . phenylephrine-shark liver oil-mineral oil-petrolatum (PREPARATION H)  0.25-3-14-71.9 % rectal ointment Place 1 application rectally as needed for hemorrhoids.   Unknown  . zolpidem (AMBIEN) 5 MG tablet Take 5 mg by mouth at bedtime as needed for sleep. For insomnia   Unknown   Scheduled: . acidophilus  2 capsule Oral Daily  . antiseptic oral rinse  7 mL Mouth Rinse BID  . ceFEPime (MAXIPIME) IV  1 g Intravenous Q24H  . Chlorhexidine Gluconate Cloth  6 each Topical Q0600  . docusate sodium  100 mg Oral BID  . escitalopram  10 mg Oral Daily  .  feeding supplement (ENSURE ENLIVE)  237 mL Oral BID BM  . furosemide  20 mg Oral Daily  . heparin  5,000 Units Subcutaneous 3 times per day  . levETIRAcetam  250 mg Oral Q12H  . levothyroxine  175 mcg Oral QAC breakfast  . metoprolol tartrate  25 mg Oral Daily  . mupirocin ointment  1 application Nasal BID  . rOPINIRole  4 mg Oral QHS  . senna-docusate  1 tablet Oral BID  . vancomycin  500 mg Intravenous Q24H   Continuous:  FTD:[id number] **OR** acetaminophen, HYDROcodone-acetaminophen, levalbuterol, ondansetron **OR** ondansetron (ZOFRAN) IV, zolpidem  Assesment: She was admitted with healthcare associated pneumonia and I think she is aspirating. She is scheduled for speech evaluation.  She has a history of seizure disorder but nothing recently.  She has a ankle ulcer and there was some question of osteomyelitis. She's been being treated with oral antibiotics. I'm going to have her get repeat MRI and if it looks like that's worse she will have PICC line placed. Principal Problem:   Healthcare-associated pneumonia Active Problems:   Convulsions/seizures   Hypothyroidism   Depression   Stasis leg ulcer   Essential hypertension   Insomnia   Protein calorie malnutrition    Plan: Speech evaluation. MRI of the ankle. Continued IV antibiotics for now. Probable transfer back to skilled care facility tomorrow    LOS: 2 days   Kirsta Probert L 09/12/2014, 8:59 AM

## 2014-09-12 NOTE — Evaluation (Signed)
Objective Swallowing Evaluation: Modified Barium Swallowing Study  Patient Details  Name: [name] MRN: [id number] Date of Birth: [birth year]  Today's Date: 09/12/2014 Time:  - 1:43 PM    Past Medical History:  Past Medical History  Diagnosis Date  . Seizures   . Aspiration pneumonia   . Dysphagia   . Osteoporosis   . Thyroid disease   . Depression   . MRSA (methicillin resistant staph aureus) culture positive   . Respiratory failure   . Hypokalemia   . Reflux   . Vertebral compression fracture     thoracic and lumbar  . History of sinus cancer 1970's    left side  . Chronic pain   . Hypertension   . GERD (gastroesophageal reflux disease)    Past Surgical History:  Past Surgical History  Procedure Laterality Date  . Kyphoplasty      Lumbar and thoracic  . Palate surgery  1970's    removal of hard and soft palate on left side  . Enucleation Left 1970's    left eye  . Cholecystectomy    . Appendectomy    . Cesarean section     HPI:  [name] is an 81 yo [facility]. She had MBSS in 2011, but records unavailable. Current chest x-ray shows: Increased right greater than left lower lobe consolidation, suspicious for progressive pneumonia. She has had several bouts of PNA and was noted to have some difficulty swallowing by her nurse so SLP asked to evaluate swallow. PMHx significant for seizures, hypothyroidism, depression, leg ulcer, essential HTN, insomnia, and protein calorie malnutrition.     Recommendation/Prognosis  Clinical Impression:   Dysphagia Diagnosis: Mild oral phase dysphagia;Moderate pharyngeal phase dysphagia Clinical impression: Pt presents with mild oral phase dysphagia characterized by weak lingual manipulation with inefficient anterior posterior transit resulting in piecemeal deglutition and lingual residue; mild/mod pharyngeal phase characterized by premature spillage (to valleculae and fills pyriforms) with  thin, nectars, and puree with delay in swallow initiation, decreased tongue base retraction, and decreased pharyngeal sensation (for residuals in valleculae and pyriforms) resulting in consistent penetration of thin liquids during the swallow, variable aspiration (trace to min amount) of thin liquids with and without straw which was primarily silent in nature, variable mild to moderate vallecular residue with puree (mild with liquids), and R>L pyriform sinus residue with liquids and puree. Although pt penetrated with nectar-thick liquids too, she seemed to be better able to sense and spontaneously coughed and cleared throat appropriately to protect airway. Pt had one episode of silent aspiration of thins after the swallow (a few seconds after) as it was noted along anterior portion of trachea and she made no effort to clear spontaneously. With cued strong cough, she was able to clear aspirate. Given recent history of several bouts of PNA and silent nature of aspiration of thins at this time, recommend continuing D3/mech soft and downgrading liquids to nectar-thick. Also recommend f/u with SLP at SNF for diet tolerance, pt/family education, and implementation of compensatory strategies.   Swallow Evaluation Recommendations:  Diet Recommendations: Dysphagia 3 (Mechanical Soft);Nectar-thick liquid Liquid Administration via: Cup;Straw Medication Administration: Whole meds with puree Supervision: Staff to assist with self feeding Compensations: Multiple dry swallows after each bite/sip;Clear throat intermittently;Effortful swallow Postural Changes and/or Swallow Maneuvers: Seated upright 90 degrees;Upright 30-60 min after meal Oral Care Recommendations: Oral care BID;Staff/trained caregiver to provide oral care Other Recommendations: Clarify dietary restrictions Follow up Recommendations: Skilled Nursing facility  Prognosis:  Prognosis for Safe Diet Advancement: Fair Barriers to Reach Goals: Severity of  dysphagia   Individuals Consulted: Consulted and Agree with Results and Recommendations: Patient;RN      SLP Assessment/Plan  Plan:  Speech Therapy Frequency (ACUTE ONLY): min 2x/week   Short Term Goals: Pt will demonstrate safe and efficient consumption of least restrictive diet with use of strategies as needed.     General: Date of Onset: 09/10/14 Type of Study: Modified Barium Swallowing Study Reason for Referral: Objectively evaluate swallowing function Previous Swallow Assessment: BSE this AM; MBSS in 2011 Diet Prior to this Study: Dysphagia 3 (soft);Thin liquids Temperature Spikes Noted: No Respiratory Status: Nasal cannula History of Recent Intubation: No Behavior/Cognition: Alert;Cooperative Oral Cavity - Dentition: Dentures, top Oral Motor / Sensory Function: Within functional limits Self-Feeding Abilities: Able to feed self;Needs set up Patient Positioning: Upright in chair Baseline Vocal Quality: Clear Volitional Cough: Strong;Congested Volitional Swallow: Able to elicit Anatomy: Other (Comment) (thoracic lordosis) Pharyngeal Secretions: Not observed secondary MBS   Reason for Referral:   Objectively evaluate swallowing function    Oral Phase: Oral Preparation/Oral Phase Oral Phase: Impaired Oral - Solids Oral - Puree: Weak lingual manipulation;Lingual/palatal residue;Piecemeal swallowing Oral - Mechanical Soft: Weak lingual manipulation;Lingual/palatal residue;Piecemeal swallowing   Pharyngeal Phase:  Pharyngeal Phase Pharyngeal Phase: Impaired Pharyngeal - Nectar Pharyngeal - Nectar Cup: Within functional limits;Delayed swallow initiation;Premature spillage to valleculae;Premature spillage to pyriform sinuses;Reduced airway/laryngeal closure;Penetration/Aspiration before swallow;Penetration/Aspiration during swallow;Pharyngeal residue - valleculae;Pharyngeal residue - pyriform sinuses;Lateral channel residue Penetration/Aspiration details (nectar cup):  Material enters airway, remains ABOVE vocal cords then ejected out;Material does not enter airway Pharyngeal - Nectar Straw: Within functional limits;Delayed swallow initiation;Premature spillage to valleculae;Premature spillage to pyriform sinuses;Reduced airway/laryngeal closure;Penetration/Aspiration before swallow;Penetration/Aspiration during swallow;Pharyngeal residue - valleculae;Pharyngeal residue - pyriform sinuses;Lateral channel residue Penetration/Aspiration details (nectar straw): Material enters airway, remains ABOVE vocal cords then ejected out;Material does not enter airway Pharyngeal - Thin Pharyngeal - Thin Cup: Delayed swallow initiation;Premature spillage to valleculae;Premature spillage to pyriform sinuses;Reduced airway/laryngeal closure;Reduced tongue base retraction;Penetration/Aspiration before swallow;Penetration/Aspiration during swallow;Trace aspiration;Pharyngeal residue - valleculae;Pharyngeal residue - pyriform sinuses;Lateral channel residue Penetration/Aspiration details (thin cup): Material enters airway, remains ABOVE vocal cords then ejected out;Material enters airway, remains ABOVE vocal cords and not ejected out;Material enters airway, passes BELOW cords without attempt by patient to eject out (silent aspiration);Material enters airway, CONTACTS cords then ejected out Pharyngeal - Thin Straw: Delayed swallow initiation;Premature spillage to valleculae;Premature spillage to pyriform sinuses;Reduced airway/laryngeal closure;Reduced tongue base retraction;Penetration/Aspiration before swallow;Penetration/Aspiration during swallow;Penetration/Aspiration after swallow;Trace aspiration;Moderate aspiration;Pharyngeal residue - valleculae;Pharyngeal residue - pyriform sinuses;Lateral channel residue Penetration/Aspiration details (thin straw): Material enters airway, remains ABOVE vocal cords then ejected out;Material enters airway, remains ABOVE vocal cords and not ejected  out;Material enters airway, passes BELOW cords without attempt by patient to eject out (silent aspiration) Pharyngeal - Solids Pharyngeal - Puree: Delayed swallow initiation;Premature spillage to valleculae;Reduced tongue base retraction;Pharyngeal residue - valleculae;Pharyngeal residue - pyriform sinuses Pharyngeal - Mechanical Soft: Delayed swallow initiation;Premature spillage to valleculae;Pharyngeal residue - valleculae (min vallecular residue (better than puree)) Pharyngeal - Pill: Not tested   Cervical Esophageal Phase  Cervical Esophageal Phase Cervical Esophageal Phase: Impaired Cervical Esophageal Phase - Solids Puree:  (delayed emptying of esophagus) Cervical Esophageal Phase - Comment Cervical Esophageal Comment: Pt cervical and thoracic esophagus nearly horizontal due to spine   GN        Thank you,  Havery Moros, CCC-SLP [phone]   Jyssica Rief 09/12/2014, 4:42 PM

## 2014-09-12 NOTE — Clinical Social Work Note (Signed)
CSW consulted today as [facility] was [facility]- message left for daughter to complete assessment. [facility] updated of probable IV Abx at dc.  Full assessment to follow-  Reece LevyJanet Emaree Chiu, MSW, Theresia MajorsLCSWA [phone]

## 2014-09-12 NOTE — Evaluation (Signed)
Physical Therapy Evaluation Patient Details Name: [name] MRN: [id number] DOB: 05/20/1923 Today's Date: 09/12/2014   History of Present Illness  Pt opresenting w/ SOB. [facility]. Per report this occurred after patient's oxygen was actually disconnected for a prolonged period of time she started to ambulate. O2 sats were as low as 67%. After O2 was reengaged oxygen saturation levels returned to the 90s. However nursing home staff decided to have patient evaluated in the emergency room. Of note patient has developed a wet cough today. Per family her shortness of breath is at baseline and other medical conditions are baseline.  Clinical Impression   Pt is seen for evaluation.  She is alert and oriented, able to recount history.  She [facility]  She states that she has declined any PT "for some time now".  She has a severe thoracic kyphosis with profound deconditioning.  She requires significant assist with transfers per her report and ambulates as an exercise only.  She is able to sit in a w/c and move about using her feet as a rudder.  [facility]      Follow Up Recommendations No PT follow up    Equipment Recommendations  None recommended by PT    Recommendations for Other Services   none    Precautions / Restrictions Precautions Precautions: Fall Restrictions Weight Bearing Restrictions: No      Mobility  Bed Mobility               General bed mobility comments: not tested .Marland Kitchen. ..pt declines any movement in the bed  Transfers                    Ambulation/Gait                Stairs            Wheelchair Mobility    Modified Rankin (Stroke Patients Only)       Balance                                             Pertinent Vitals/Pain Pain Assessment: No/denies pain    Home Living Family/patient  expects to be discharged to:: Skilled nursing facility                 Additional Comments: [facility]    Prior Function Level of Independence: Needs assistance   Gait / Transfers Assistance Needed: pt able to ambulate only short distances with a walker and assist as well as w/c carried behind her...significant assist with transfer...pt states that she is primarily w/c dependent  ADL's / Homemaking Assistance Needed: able to feed herself but otherwise is total assist        Hand Dominance        Extremity/Trunk Assessment   Upper Extremity Assessment: Generalized weakness           Lower Extremity Assessment: Generalized weakness      Cervical / Trunk Assessment: Kyphotic (severe kyphosis)  Communication   Communication: HOH  Cognition Arousal/Alertness: Awake/alert Behavior During Therapy: WFL for tasks assessed/performed Overall Cognitive Status: Within Functional Limits for tasks assessed  General Comments      Exercises        Assessment/Plan    PT Assessment Patent does not need any further PT services  PT Diagnosis     PT Problem List    PT Treatment Interventions     PT Goals (Current goals can be found in the Care Plan section) Acute Rehab PT Goals PT Goal Formulation: All assessment and education complete, DC therapy    Frequency     Barriers to discharge        Co-evaluation               End of Session Equipment Utilized During Treatment: Oxygen Activity Tolerance: Patient limited by fatigue Patient left: in bed;with call bell/phone within reach;with bed alarm set;with nursing/sitter in room Nurse Communication: Mobility status         Time: 0454-0981 PT Time Calculation (min) (ACUTE ONLY): 22 min   Charges:   PT Evaluation $Initial PT Evaluation Tier I: 1 Procedure     PT G CodesMyrlene Broker L 09/12/2014, 8:57 AM

## 2014-09-12 NOTE — Clinical Documentation Improvement (Signed)
Notes state Protein calorie malnutrition, without severity. . Severity: --Mild (first degree) --Moderate (second degree) --Severe (third degree) . Avoid documenting a range of severity, such as "moderate to severe" . Form: --Kwashiorkor (rarely seen in the [place] --Marasmus --Marasmic kwashiorkor --Other . Document any associated diagnoses/conditions  Supporting Information: Notes state "Protein calorie malnutrition", BMI is <19.    Thank You,

## 2014-09-12 NOTE — Evaluation (Signed)
Clinical/Bedside Swallow Evaluation Patient Details  Name: [name] MRN: [id number] Date of Birth: August 03, 1922  Today's Date: 09/12/2014 Time: SLP Start Time (ACUTE ONLY): 1230 SLP Stop Time (ACUTE ONLY): 1255 SLP Time Calculation (min) (ACUTE ONLY): 25 min  Past Medical History:  Past Medical History  Diagnosis Date  . Seizures   . Aspiration pneumonia   . Dysphagia   . Osteoporosis   . Thyroid disease   . Depression   . MRSA (methicillin resistant staph aureus) culture positive   . Respiratory failure   . Hypokalemia   . Reflux   . Vertebral compression fracture     thoracic and lumbar  . History of sinus cancer 1970's    left side  . Chronic pain   . Hypertension   . GERD (gastroesophageal reflux disease)    Past Surgical History:  Past Surgical History  Procedure Laterality Date  . Kyphoplasty      Lumbar and thoracic  . Palate surgery  1970's    removal of hard and soft palate on left side  . Enucleation Left 1970's    left eye  . Cholecystectomy    . Appendectomy    . Cesarean section     HPI:  [name] is an 81 yo female admitted from [facility] with PNA. She had MBSS in 2011, but records unavailable. Current chest x-ray shows: Increased right greater than left lower lobe consolidation, suspicious for progressive pneumonia. She has had several bouts of PNA and was noted to have some difficulty swallowing by her nurse so SLP asked to evaluate swallow. PMHx significant for seizures, hypothyroidism, depression, leg ulcer, essential HTN, insomnia, and protein calorie malnutrition.   Assessment / Plan / Recommendation Clinical Impression  Pt presents with mild wet/congested vocal quality prior to bedside swallow evaluation, however she had just finished her lunch meal (she drank liquids, ate mashed potato, and a few bites of chicken). When pt cued to cough, she expectorated thick phlegm. Pt with suboptimal posture (kyphotic in thoracic region and lordotic in  cervical spine), which likely negatively impacts safe and efficient swallow. Oral motor structures appear WNL, but suspect delay in swallow initiation. Pt with audible swallow with thin liquids; one episode of delayed congested cough. Pt declined trials mech soft despite frequent requests/encouragement, stating, "I'm too tired". Given history of frequent bouts of PNA, advanced age, poor posture due to degenerative changes within the spine, and weakness, recommend MBSS to assist with safe diet planning and identify strategies if appropriate. Will complete this date.     Aspiration Risk  Mild    Diet Recommendation Dysphagia 3 (Mechanical Soft);Thin liquid   Supervision: Staff to assist with self feeding Compensations:  (pending results of MBSS) Postural Changes and/or Swallow Maneuvers: Seated upright 90 degrees;Upright 30-60 min after meal    Other  Recommendations Recommended Consults: MBS Oral Care Recommendations: Oral care BID;Staff/trained caregiver to provide oral care Other Recommendations: Clarify dietary restrictions   Follow Up Recommendations  Skilled Nursing facility    Frequency and Duration min 2x/week  1 week   Pertinent Vitals/Pain VSS    SLP Swallow Goals   Pt will demonstrate safe and efficient consumption of least restrictive diet with use of strategies as needed.   Swallow Study Prior Functional Status   Long term care resident at [facility]    General Date of Onset: 09/10/14 HPI: [name] is an 81 yo female admitted from [facility] with PNA. She had MBSS in 2011, but records  unavailable. Current chest x-ray shows: Increased right greater than left lower lobe consolidation, suspicious for progressive pneumonia. She has had several bouts of PNA and was noted to have some difficulty swallowing by her nurse so SLP asked to evaluate swallow. PMHx significant for seizures, hypothyroidism, depression, leg ulcer, essential HTN, insomnia, and protein calorie malnutrition. Type  of Study: Bedside swallow evaluation Previous Swallow Assessment: 2011 Diet Prior to this Study: Dysphagia 3 (soft);Thin liquids Temperature Spikes Noted: No Respiratory Status: Nasal cannula History of Recent Intubation: No Behavior/Cognition: Alert;Cooperative Oral Cavity - Dentition: Dentures, top Self-Feeding Abilities: Able to feed self;Needs set up Patient Positioning: Postural control interferes with function Baseline Vocal Quality: Wet Volitional Cough: Strong;Congested Volitional Swallow: Able to elicit    Oral/Motor/Sensory Function Overall Oral Motor/Sensory Function: Appears within functional limits for tasks assessed Labial ROM: Within Functional Limits Labial Symmetry: Within Functional Limits Labial Strength: Within Functional Limits Labial Sensation: Within Functional Limits Lingual ROM: Within Functional Limits Lingual Symmetry: Within Functional Limits Lingual Strength: Within Functional Limits Lingual Sensation: Within Functional Limits Facial ROM: Within Functional Limits Facial Symmetry: Within Functional Limits Facial Strength: Within Functional Limits Facial Sensation: Within Functional Limits Velum: Within Functional Limits Mandible: Within Functional Limits   Ice Chips Ice chips: Within functional limits Presentation: Spoon   Thin Liquid Thin Liquid: Impaired Presentation: Cup;Straw Pharyngeal  Phase Impairments: Suspected delayed Swallow (audible swallow)    Nectar Thick Nectar Thick Liquid: Within functional limits Presentation: Straw   Honey Thick Honey Thick Liquid: Not tested   Puree Puree: Within functional limits Presentation: Spoon   Solid   Thank you,  Havery MorosDabney Tavio Biegel, CCC-SLP [phone]     Solid: Not tested (Pt declined repeatedly)       Sparrow Sanzo 09/12/2014,1:13 PM

## 2014-09-12 NOTE — Clinical Social Work Note (Signed)
Clinical Social Work Assessment  Patient Details  Name: [name] MRN: [id number] Date of Birth: 1922-11-22  Date of referral:  09/12/14               Reason for consult:  Facility Placement                Permission sought to share information with:  Family Supports Permission granted to share information::  Yes, Verbal Permission Granted  Name::     daughter- HCPOA-   Agency::     Relationship::     Contact Information:     Housing/Transportation Living arrangements for the past 2 months:  Location managerost-Acute Facility Source of Information:  Adult Children Patient Interpreter Needed:  None Criminal Activity/Legal Involvement Pertinent to Current Situation/Hospitalization:  No - Comment as needed Significant Relationships:  Adult Children Lives with:  Facility Resident Do you feel safe going back to the place where you live?  Yes Need for family participation in patient care:  Yes (Comment)  Care giving concerns:  [facility]- anticipate return at dc (with ?IVABX)   Office managerocial Worker assessment / plan:  CSW spoke with patient's daughter who is HCPOA- she reports her [facility]- "its become her home". Per daughter, she does plan for [facility]. [facility] has been updated and [facility] rep contacted who agrees to plans for her to return at dc.   Employment status:  Retired Health and safety inspectornsurance information:    PT Recommendations:  Skilled Teacher, early years/preursing Facility Information / Referral to community resources:  Skilled Nursing Facility  Patient/Family's Response to care:  Daughter reports she was here earlier today and was updated by MD- she is also awaiting further word on MRI and ?osteomleits and plans for ?IV abx. She appears to have a good medical understanding of her mothers'  Needs and treatment plans. She is appreciative of CSW assist.  Patient/Family's Understanding of and Emotional Response to Diagnosis, Current Treatment, and Prognosis:   Daughter asking, "did the MRI results come in yet?" CSW advised her we are still waiting for final results and plans (?IV abx). She plans to come back to visit this pm and "maybe see Dr Juanetta GoslingHawkins again for an update". CSW advised daughter we will follow along and assist with sNF return when appropriate.  Emotional Assessment Appearance:  Appears stated age Attitude/Demeanor/Rapport:  Unable to Assess Affect (typically observed):  Withdrawn Orientation:  Oriented to Self Alcohol / Substance use:    Psych involvement (Current and /or in the community):  No (Comment)  Discharge Needs  Concerns to be addressed:    Readmission within the last 30 days:  No Current discharge risk:  None Barriers to Discharge:  No Barriers Identified   Liliana ClineCaldwell, Nyasiah Moffet Parks, LCSW 09/12/2014, 3:05 PM

## 2014-09-12 NOTE — Progress Notes (Signed)
OT Cancellation Note  Patient Details Name: [name] MRN: [id number] DOB: [birth year]   Cancelled Treatment:    Reason Eval/Treat Not Completed: OT screened, no needs identified, will sign off. Spoke with Dr. Luan Pulling who had just met with patient and he does not see any need for OT services at this time. Thank-you for the referral.   Ailene Ravel, OTR/L,CBIS  [phone]  09/12/2014, 8:35 AM

## 2014-09-12 NOTE — Progress Notes (Signed)
INITIAL NUTRITION ASSESSMENT  DOCUMENTATION CODES Per approved criteria  -Severe malnutrition in the context of chronic illness -Underweight   Pt meets criteria for severe MALNUTRITION in the context of chronic illness as evidenced by severe muscle wasting to clavicles, temples and quadricepts and energy intake </= 75% for >/= 1 month.   INTERVENTION: Ensure Enlive (chocolate) po BID, each supplement provides 350 kcal and 20 grams of protein   [name] will continue to follow  NUTRITION DIAGNOSIS: Inadequate oral intake related to respiratory failure as evidenced by recurrent pneumonia, severe muscle wasting and 0-50% of meals consumed.   Goal: Pt to meet >/= 90% of their estimated nutrition needs    Monitor:  Po intake (meals and supplements), labs and wt trends   Reason for Assessment: Malnutrition Screen   81 y.o. female  Admitting Dx: Healthcare-associated pneumonia  ASS[facility]MENT[facility] Hx of dysphagia, recurrent pneumonia and weight loss over time (16%) decrease in past 2 years and 5.5% in the passt 90 days. She has been evaluated by ST and diet downgraded to dysphagia 3 with nectar-thick liquids due to silent aspiration. Nutrition focused exam: severe muscle wasting to temples, clavicles and quadriceps and moderate fat depletion upper arms and orbital areas. Abnormal Labs: BUN 33 and Creat 1.21   Height: Ht Readings from Last 1 Encounters:  09/10/14 5' (1.524 m)    Weight: Wt Readings from Last 1 Encounters:  09/10/14 91 lb 0.8 oz (41.3 kg)    Ideal Body Weight: 100# (45.4 kg)   Ideal Body Weight: 91%  Wt Readings from Last 10 Encounters:  09/10/14 91 lb 0.8 oz (41.3 kg)  09/12/14 91 lb (41.277 kg)  08/13/14 90 lb 2.7 oz (40.9 kg)  06/09/13 98 lb 15.8 oz (44.9 kg)  07/16/12 108 lb (48.988 kg)    Usual Body Weight: 93-97# (for the past 6 months)  % Usual Body Weight: 98%  BMI:  Body mass index is 17.78 kg/(m^2).  underweight Estimated Nutritional  Needs: Kcal: [id number] Protein: >60 gr Fluid: >1200 ml daily  Skin:  Intact   Diet Order: Mechanical soft (po meal intake 25%)  EDUCATION NEEDS: -No education needs identified at this time   Intake/Output Summary (Last 24 hours) at 09/12/14 1209 Last data filed at 09/12/14 1150  Gross per 24 hour  Intake    210 ml  Output      3 ml  Net    207 ml    Last BM: 09/10/14  Labs:   Recent Labs Lab 09/10/14 1945 09/11/14 0630 09/12/14 0457  NA 143 142 143  K 3.9 4.2 4.4  CL 96 96 97  CO2 42* 39* 38*  BUN 27* 31* 33*  CREATININE 1.06 1.12* 1.21*  CALCIUM 9.6 9.0 9.0  GLUCOSE 129* 112* 121*    CBG (last 3)  No results for input(s): GLUCAP in the last 72 hours.  Scheduled Meds: . acidophilus  2 capsule Oral Daily  . antiseptic oral rinse  7 mL Mouth Rinse BID  . ceFEPime (MAXIPIME) IV  1 g Intravenous Q24H  . Chlorhexidine Gluconate Cloth  6 each Topical Q0600  . docusate sodium  100 mg Oral BID  . escitalopram  10 mg Oral Daily  . feeding supplement (ENSURE ENLIVE)  237 mL Oral BID BM  . furosemide  20 mg Oral Daily  . heparin  5,000 Units Subcutaneous 3 times per day  . levETIRAcetam  250 mg Oral Q12H  . levothyroxine  175 mcg Oral QAC  breakfast  . metoprolol tartrate  25 mg Oral Daily  . mupirocin ointment  1 application Nasal BID  . rOPINIRole  4 mg Oral QHS  . senna-docusate  1 tablet Oral BID  . vancomycin  500 mg Intravenous Q24H    Continuous Infusions:   Past Medical History  Diagnosis Date  . Seizures   . Aspiration pneumonia   . Dysphagia   . Osteoporosis   . Thyroid disease   . Depression   . MRSA (methicillin resistant staph aureus) culture positive   . Respiratory failure   . Hypokalemia   . Reflux   . Vertebral compression fracture     thoracic and lumbar  . History of sinus cancer 1970's    left side  . Chronic pain   . Hypertension   . GERD (gastroesophageal reflux disease)     Past Surgical History  Procedure Laterality  Date  . Kyphoplasty      Lumbar and thoracic  . Palate surgery  1970's    removal of hard and soft palate on left side  . Enucleation Left 1970's    left eye  . Cholecystectomy    . Appendectomy    . Cesarean section      [name],[name],[name],[name] Office: #[phone] Pager: [phone]

## 2014-09-12 NOTE — Progress Notes (Signed)
Notified Dr. Juanetta GoslingHawkins to update him on the fact that the MRI is negative for osteomyelitis and of the speech therapist seeing the patient with new recommendations for the diet.  He verbalized understanding and no further orders given at this time.

## 2014-09-13 ENCOUNTER — Inpatient Hospital Stay
Admission: RE | Admit: 2014-09-13 | Discharge: 2014-12-12 | Disposition: A | Discharge status: Nursing Home (Includes ICF) | Payer: Medicaid Other | Source: Ambulatory Visit | Attending: Internal Medicine | Admitting: Internal Medicine

## 2014-09-13 DIAGNOSIS — E43 Unspecified severe protein-calorie malnutrition: Secondary | ICD-10-CM | POA: Diagnosis present

## 2014-09-13 MED ORDER — CLINDAMYCIN HCL 300 MG PO CAPS: 300.0000 mg | ORAL_CAPSULE | Freq: Three times a day (TID) | ORAL | Status: DC

## 2014-09-13 NOTE — Progress Notes (Signed)
She feels well. She is aspirating based on the speech evaluation. She needs thickening of her fluids and a dysphagia 3 diet. She does not have osteomyelitis of the leg based on her MRI. She wants to go back to the nursing home. I don't think she needs to be treated for "traditional" healthcare associated pneumonia because it is aspiration. She is okay for transfer to skilled care facility

## 2014-09-13 NOTE — Care Management Utilization Note (Signed)
UR completed 

## 2014-09-13 NOTE — Progress Notes (Signed)
[facility] Stated she had no further questions. Patient stable at discharge. Transported by [employer] via bed.

## 2014-09-13 NOTE — Care Management Note (Signed)
    Page 1 of 1   09/13/2014     9:59:33 AM CARE MANAGEMENT NOTE 09/13/2014  Patient:  [name],[name]   Account Number:  [id number]  Date Initiated:  09/13/2014  Documentation initiated by:  CHILDRESS,JESSICA  Subjective/Objective Assessment:   Pt is from SNF, admitted with HCAP. [facility] CSW is aware and will arrange for return to facility. No CM needs.     Action/Plan:   Anticipated DC Date:  09/13/2014   Anticipated DC Plan:  SKILLED NURSING FACILITY  In-house referral  Clinical Social Worker      DC Planning Services  CM consult      Choice offered to / List presented to:             Status of service:  Completed, signed off Medicare Important Message given?  YES (If response is "NO", the following Medicare IM given date fields will be blank) Date Medicare IM given:  09/13/2014 Medicare IM given by:  Kathyrn SheriffHILDRESS,JESSICA Date Additional Medicare IM given:   Additional Medicare IM given by:    Discharge Disposition:  SKILLED NURSING FACILITY  Per UR Regulation:    If discussed at Long Length of Stay Meetings, dates discussed:    Comments:  09/13/2014 0945 Kathyrn SheriffJessica Childress, RN, MSN, CM

## 2014-09-13 NOTE — Clinical Social Work Note (Signed)
[facility] Pt, pt's daughter, and facility aware and agreeable. Will transfer with staff.   Derenda FennelKara Tennyson Wacha, KentuckyLCSW [phone]

## 2014-09-13 NOTE — Discharge Summary (Signed)
Physician Discharge Summary  Patient ID: [name] MRN: [id number] DOB/AGE: Jul 04, 1922 81 y.o. Primary Care Physician:[name] Custer L, MD Admit date: 09/10/2014 Discharge date: 09/13/2014    Discharge Diagnoses:   Principal Problem:   Healthcare-associated pneumonia Active Problems:   Convulsions/seizures   Hypothyroidism   Depression   Stasis leg ulcer   Essential hypertension   Insomnia   Protein calorie malnutrition   Protein-calorie malnutrition, severe  chronic aspiration syndrome    Medication List    STOP taking these medications        collagenase ointment  Commonly known as:  SANTYL     levofloxacin 250 MG tablet  Commonly known as:  LEVAQUIN     levofloxacin 500 MG tablet  Commonly known as:  LEVAQUIN     methylPREDNIsolone 4 MG tablet  Commonly known as:  MEDROL DOSPACK      TAKE these medications        AZO CRANBERRY PO  Take 1 tablet by mouth 2 (two) times daily.     calcium-vitamin D 500-200 MG-UNIT per tablet  Commonly known as:  OSCAL WITH D  Take 1 tablet by mouth 3 (three) times daily. For osteoporosis     clindamycin 300 MG capsule  Commonly known as:  CLEOCIN  Take 1 capsule (300 mg total) by mouth 3 (three) times daily.     docusate sodium 100 MG capsule  Commonly known as:  COLACE  Take 100 mg by mouth 2 (two) times daily. For constipation     ENSURE PLUS Liqd  Take 237 mLs by mouth 2 (two) times daily between meals. Nutritional supplement     escitalopram 10 MG tablet  Commonly known as:  LEXAPRO  Take 10 mg by mouth daily. For depression     feeding supplement (PRO-STAT SUGAR FREE 64) Liqd  Take 30 mLs by mouth 2 (two) times daily.     furosemide 20 MG tablet  Commonly known as:  LASIX  Take 20 mg by mouth daily. For HTN/ edema     HYDROcodone-acetaminophen 5-325 MG per tablet  Commonly known as:  NORCO/VICODIN  Take one tablet by mouth three times daily and every 6 hours as needed for pain     hydroxypropyl  methylcellulose / hypromellose 2.5 % ophthalmic solution  Commonly known as:  ISOPTO TEARS / GONIOVISC  Place 1 drop into the right eye as needed for dry eyes.     iron polysaccharides 150 MG capsule  Commonly known as:  NIFEREX  Take 150 mg by mouth daily.     lactobacillus acidophilus Tabs tablet  Take 2 tablets by mouth daily.     levalbuterol 0.63 MG/3ML nebulizer solution  Commonly known as:  XOPENEX  Take 0.63 mg by nebulization every 4 (four) hours as needed for wheezing or shortness of breath.     levETIRAcetam 250 MG tablet  Commonly known as:  KEPPRA  Take 250 mg by mouth every 12 (twelve) hours. For seizures     levothyroxine 175 MCG tablet  Commonly known as:  SYNTHROID, LEVOTHROID  Take 175 mcg by mouth daily. For thyroid therapy     loratadine 10 MG tablet  Commonly known as:  CLARITIN  Take 10 mg by mouth daily. For allergies     magnesium hydroxide 400 MG/5ML suspension  Commonly known as:  MILK OF MAGNESIA  Take 30 mLs by mouth daily as needed for mild constipation.     metoprolol tartrate 25 MG tablet  Commonly known as:  LOPRESSOR  Take 25 mg by mouth daily. For HTN     multivitamin with minerals Tabs tablet  Take 1 tablet by mouth daily. For nutritional supplement     MYLANTA 200-200-20 MG/5ML suspension  Generic drug:  alum & mag hydroxide-simeth  Take 30 mLs by mouth every 4 (four) hours as needed for indigestion or heartburn.     PREPARATION H 0.25-3-14-71.9 % rectal ointment  Generic drug:  phenylephrine-shark liver oil-mineral oil-petrolatum  Place 1 application rectally as needed for hemorrhoids.     ranitidine 150 MG tablet  Commonly known as:  ZANTAC  Take 150 mg by mouth at bedtime.     rOPINIRole 4 MG tablet  Commonly known as:  REQUIP  Take 4 mg by mouth at bedtime. For parkinson disease     sennosides-docusate sodium 8.6-50 MG tablet  Commonly known as:  SENOKOT-S  Take 1 tablet by mouth 2 (two) times daily. For constipation      zolpidem 5 MG tablet  Commonly known as:  AMBIEN  Take 5 mg by mouth at bedtime as needed for sleep. For insomnia        Discharged Condition: Improved    Consults: Speech  Significant Diagnostic Studies: Mr Ankle Right  Wo Contrast  09/12/2014   CLINICAL DATA:  Nonhealing ulcer of the left ankle.  EXAM: MRI OF THE RIGHT ANKLE WITHOUT CONTRAST  TECHNIQUE: Multiplanar, multisequence MR imaging of the ankle was performed. No intravenous contrast was administered.  COMPARISON:  Radiographs dated 06/17/2014 and MRI dated 07/14/2014  FINDINGS: TENDONS  Peroneal: Normal.  Posteromedial: Intact. Fluid in the tendon sheaths of the flexor digitorum longus and flexor hallucis longus, unchanged.  Anterior: Normal.  Achilles: Normal.  Plantar Fascia: Normal.  LIGAMENTS  Lateral: Normal.  Medial: Normal.  CARTILAGE  Ankle Joint: Normal.  Subtalar Joints/Sinus Tarsi: Normal.  Bones: Slight increased marrow edema in the tip of the lateral malleolus. No cortical destruction. Persistent soft tissue thickening over the tip of the malleolus without a visible abscess. Slight edema in the subcutaneous fat at the lateral aspect of the lower leg. Decreased circumferential subcutaneous edema since the prior study.  IMPRESSION: Slight increased marrow edema in the lateral aspect of the tip of the distal fibula without bone destruction. The appearance is not meet criteria for osteomyelitis. I suspect this is due to hyperemia due to the adjacent ulcer.   Electronically Signed   By: Francene Boyers M.D.   On: 09/12/2014 11:38   Dg Chest Port 1 View  09/10/2014   CLINICAL DATA:  Shortness of breath, pneumonia  EXAM: PORTABLE CHEST - 1 VIEW  COMPARISON:  08/12/2014  FINDINGS: Interval increase in consolidation at the right lung base. Retrocardiac opacity is also identified. Patient's head obscures detail over the apices. Heart size is upper limits of normal. Evidence of vertebral augmentation. Chronic right humeral  dislocation. Right apical skin fold simulates a pneumothorax.  IMPRESSION: Increased right greater than left lower lobe consolidation, suspicious for progressive pneumonia. Followup to radiographic resolution is recommended in 3-4 weeks to exclude underlying neoplasm.  Chronic right humeral dislocation.   Electronically Signed   By: Christiana Pellant M.D.   On: 09/10/2014 20:22    Lab Results: Basic Metabolic Panel:  Recent Labs  04/54/09 0630 09/12/14 0457  NA 142 143  K 4.2 4.4  CL 96 97  CO2 39* 38*  GLUCOSE 112* 121*  BUN 31* 33*  CREATININE 1.12* 1.21*  CALCIUM 9.0 9.0   Liver Function  Tests:  Recent Labs  09/11/14 0630  AST 21  ALT 8  ALKPHOS 51  BILITOT 0.4  PROT 5.9*  ALBUMIN 2.6*     CBC:  Recent Labs  09/10/14 1945 09/11/14 0630  WBC 3.5* 13.6*  HGB 8.3* 8.2*  HCT 28.7* 27.5*  MCV 94.7 93.2  PLT 215 193    Recent Results (from the past 240 hour(s))  Culture, blood (routine x 2) Call MD if unable to obtain prior to antibiotics being given     Status: None (Preliminary result)   Collection Time: 09/10/14 11:03 PM  Result Value Ref Range Status   Specimen Description LEFT ANTECUBITAL  Final   Special Requests BOTTLES DRAWN AEROBIC ONLY 6CC  Final   Culture NO GROWTH 2 DAYS  Final   Report Status PENDING  Incomplete  Culture, blood (routine x 2) Call MD if unable to obtain prior to antibiotics being given     Status: None (Preliminary result)   Collection Time: 09/10/14 11:08 PM  Result Value Ref Range Status   Specimen Description LEFT ANTECUBITAL  Final   Special Requests BOTTLES DRAWN AEROBIC ONLY 6CC  Final   Culture NO GROWTH 2 DAYS  Final   Report Status PENDING  Incomplete  MRSA PCR Screening     Status: Abnormal   Collection Time: 09/11/14 12:50 AM  Result Value Ref Range Status   MRSA by PCR POSITIVE (A) NEGATIVE Final    Comment:        The GeneXpert MRSA Assay (FDA approved for NASAL specimens only), is one component of  a comprehensive MRSA colonization surveillance program. It is not intended to diagnose MRSA infection nor to guide or monitor treatment for MRSA infections. RESULT CALLED TO, READ BACK BY AND VERIFIED WITH:  THOMAS,K @ 0235 ON 09/11/14 BY [facility]WOODIE,J      Hospital Course: This is an 81 year old who had been in her usual state of fair health at a skilled care facility when she suddenly developed congestion and hypoxia. She was brought to the emergency department where she was noted to have pneumonia. She was started on treatment for healthcare associated pneumonia. She improved fairly rapidly. She had speech evaluation and was noted to have episodes of aspiration. She had MRI of her right ankle because of concerns that she might have osteomyelitis because of a nonhealing wound on the ankle but she does not show osteomyelitis. Because this was not typical healthcare associated pneumonia she is going to be treated with oral antibiotics based on the aspiration. I discussed all this with her daughter at bedside and she understands that this will be a recurrent problem  Discharge Exam: Blood pressure 121/58, pulse 87, temperature 98 F (36.7 C), temperature source Oral, resp. rate 20, height 5' (1.524 m), weight 41.3 kg (91 lb 0.8 oz), SpO2 97 %. She is awake and alert. She has significant malnutrition. Her chest is relatively clear  Disposition: Back to the skilled care facility. She will need a dysphagia 3 diet with nectar thick liquids. She needs to be reminded about being careful with swallowing. She will need speech treatment at the skilled care facility. She will be on clindamycin 300 mg by mouth every 8 hours for 5 days and then discontinue. She will continue with all her other medications.      Discharge Instructions    Discharge to SNF when bed available    Complete by:  As directed  Signed: Ianna Salmela Kaiser   09/13/2014, 9:06 AM

## 2014-09-15 LAB — CULTURE, BLOOD (ROUTINE X 2)
CULTURE: NO GROWTH
Culture: NO GROWTH

## 2014-09-21 NOTE — Discharge Summary (Signed)
NAME:  [name], [name]               ACCOUNT NO.:  [id number]  MEDICAL RECORD NO.:  [id number]  LOCATION:                                 FACILITY:  PHYSICIAN:  Simon Aaberg L. Juanetta GoslingHawkins, M.D.DATE OF BIRTH:  Nov 26, 1922  DATE OF ADMISSION:  09/10/2014 DATE OF DISCHARGE:  04/26/2016LH                              DISCHARGE SUMMARY   ADDENDUM  DISCHARGE DIAGNOSES:  Aspiration pneumonia.     Junior Kenedy L. Juanetta GoslingHawkins, M.D.     ELH/MEDQ  D:  09/21/2014  T:  09/21/2014  Job:  [id number]

## 2014-09-26 NOTE — H&P (Signed)
[name] MRN: [id number] DOB/AGE: 1922-06-14 81 y.o. Primary Care Physician:Dasan Hardman L, MD Admit date: 09/13/2014 Chief Complaint: Aspiration pneumonia HPI: This is an 81 year old who has a long known history of medical problems. She has been in the skilled care facility for several years. She was recently hospitalized with an episode of pneumonia and it appears that she has been aspirating. She has had speech evaluation and is going to continue speech therapy in the nursing home. In addition to that problem she has a skin lesion on her ankle which has slightly improved. 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 She has severe osteoporosis and has had multiple compression fractures  Past Medical History  Diagnosis Date  . Seizures   . Aspiration pneumonia   . Dysphagia   . Osteoporosis   . Thyroid disease   . Depression   . MRSA (methicillin resistant staph aureus) culture positive   . Respiratory failure   . Hypokalemia   . Reflux   . Vertebral compression fracture     thoracic and lumbar  . History of sinus cancer 1970's    left side  . Chronic pain   . Hypertension   . GERD (gastroesophageal reflux disease)    Past Surgical History  Procedure Laterality Date  . Kyphoplasty      Lumbar and thoracic  . Palate surgery  1970's    removal of hard and soft palate on left side  . Enucleation Left 1970's    left eye  . Cholecystectomy    . Appendectomy    . Cesarean section          Family History  Problem Relation Age of Onset  . Cancer Father     Social History:  reports that she has never smoked. She does not have any smokeless tobacco history on file. She reports that she does not drink alcohol or use illicit drugs.   Allergies:  Allergies  Allergen Reactions  . Aricept [Donepezil Hcl]   . Bactrim [Sulfamethoxazole-Trimethoprim]   . Ciprofloxacin   .  Darvocet [Propoxyphene N-Acetaminophen]   . Namenda [Memantine Hcl]   . Penicillins   . Vibramycin [Doxycycline Calcium]     Medications Prior to Admission  Medication Sig Dispense Refill  . alum & mag hydroxide-simeth (MYLANTA) 200-200-20 MG/5ML suspension Take 30 mLs by mouth every 4 (four) hours as needed for indigestion or heartburn.    . Amino Acids-Protein Hydrolys (FEEDING SUPPLEMENT, PRO-STAT SUGAR FREE 64,) LIQD Take 30 mLs by mouth 2 (two) times daily.    . calcium-vitamin D (OSCAL WITH D) 500-200 MG-UNIT per tablet Take 1 tablet by mouth 3 (three) times daily. For osteoporosis    . clindamycin (CLEOCIN) 300 MG capsule Take 1 capsule (300 mg total) by mouth 3 (three) times daily.    . Cranberry-Vitamin C-Probiotic (AZO CRANBERRY PO) Take 1 tablet by mouth 2 (two) times daily.    Marland Kitchen. docusate sodium (COLACE) 100 MG capsule Take 100 mg by mouth 2 (two) times daily. For constipation    . ENSURE PLUS (ENSURE PLUS) LIQD Take 237 mLs by mouth 2 (two) times daily between meals. Nutritional supplement    . escitalopram (LEXAPRO) 10 MG tablet Take 10 mg by mouth daily. For depression    . furosemide (LASIX) 20 MG tablet Take 20 mg by mouth daily. For HTN/ edema    . HYDROcodone-acetaminophen (NORCO/VICODIN) 5-325 MG  per tablet Take one tablet by mouth three times daily and every 6 hours as needed for pain 210 tablet 0  . hydroxypropyl methylcellulose / hypromellose (ISOPTO TEARS / GONIOVISC) 2.5 % ophthalmic solution Place 1 drop into the right eye as needed for dry eyes.    . iron polysaccharides (NIFEREX) 150 MG capsule Take 150 mg by mouth daily.    Marland Kitchen. lactobacillus acidophilus (BACID) TABS tablet Take 2 tablets by mouth daily.    Marland Kitchen. levalbuterol (XOPENEX) 0.63 MG/3ML nebulizer solution Take 0.63 mg by nebulization every 4 (four) hours as needed for wheezing or shortness of breath.    . levETIRAcetam (KEPPRA) 250 MG tablet Take 250 mg by mouth every 12 (twelve) hours. For seizures    .  levothyroxine (SYNTHROID, LEVOTHROID) 175 MCG tablet Take 175 mcg by mouth daily. For thyroid therapy    . loratadine (CLARITIN) 10 MG tablet Take 10 mg by mouth daily. For allergies    . magnesium hydroxide (MILK OF MAGNESIA) 400 MG/5ML suspension Take 30 mLs by mouth daily as needed for mild constipation.    . metoprolol tartrate (LOPRESSOR) 25 MG tablet Take 25 mg by mouth daily. For HTN    . Multiple Vitamin (MULTIVITAMIN WITH MINERALS) TABS Take 1 tablet by mouth daily. For nutritional supplement    . phenylephrine-shark liver oil-mineral oil-petrolatum (PREPARATION H) 0.25-3-14-71.9 % rectal ointment Place 1 application rectally as needed for hemorrhoids.    . ranitidine (ZANTAC) 150 MG tablet Take 150 mg by mouth at bedtime.    Marland Kitchen. rOPINIRole (REQUIP) 4 MG tablet Take 4 mg by mouth at bedtime. For parkinson disease    . sennosides-docusate sodium (SENOKOT-S) 8.6-50 MG tablet Take 1 tablet by mouth 2 (two) times daily. For constipation    . zolpidem (AMBIEN) 5 MG tablet Take 5 mg by mouth at bedtime as needed for sleep. For insomnia         ZOX:WRUEAROS:apart from the symptoms mentioned above,there are no other symptoms referable to all systems reviewed.  Physical Exam: There were no vitals taken for this visit. She is awake and alert. She is sitting in a wheelchair. Her left eye is missing. Her mucous membranes are moist. Her neck is supple without masses. Her chest is relatively clear. Her heart is regular without gallop. She has significant kyphosis. Her abdomen is soft. Extremities showed no edema. Central nervous system examination is grossly intact. Her chronic skin wound is improving on the left ankle   No results for input(s): WBC, NEUTROABS, HGB, HCT, MCV, PLT in the last 72 hours. No results for input(s): NA, K, CL, CO2, GLUCOSE, BUN, CREATININE, CALCIUM, MG in the last 72 hours.  Invalid input(s): PHOlablast2(ast:2,ALT:2,alkphos:2,bilitot:2,prot:2,albumin:2)@    No results found  for this or any previous visit (from the past 240 hour(s)).   Mr Ankle Right  Wo Contrast  09/12/2014   CLINICAL DATA:  Nonhealing ulcer of the left ankle.  EXAM: MRI OF THE RIGHT ANKLE WITHOUT CONTRAST  TECHNIQUE: Multiplanar, multisequence MR imaging of the ankle was performed. No intravenous contrast was administered.  COMPARISON:  Radiographs dated 06/17/2014 and MRI dated 07/14/2014  FINDINGS: TENDONS  Peroneal: Normal.  Posteromedial: Intact. Fluid in the tendon sheaths of the flexor digitorum longus and flexor hallucis longus, unchanged.  Anterior: Normal.  Achilles: Normal.  Plantar Fascia: Normal.  LIGAMENTS  Lateral: Normal.  Medial: Normal.  CARTILAGE  Ankle Joint: Normal.  Subtalar Joints/Sinus Tarsi: Normal.  Bones: Slight increased marrow edema in the tip of the  lateral malleolus. No cortical destruction. Persistent soft tissue thickening over the tip of the malleolus without a visible abscess. Slight edema in the subcutaneous fat at the lateral aspect of the lower leg. Decreased circumferential subcutaneous edema since the prior study.  IMPRESSION: Slight increased marrow edema in the lateral aspect of the tip of the distal fibula without bone destruction. The appearance is not meet criteria for osteomyelitis. I suspect this is due to hyperemia due to the adjacent ulcer.   Electronically Signed   By: Francene Boyers M.D.   On: 09/12/2014 11:38   Dg Chest Port 1 View  09/10/2014   CLINICAL DATA:  Shortness of breath, pneumonia  EXAM: PORTABLE CHEST - 1 VIEW  COMPARISON:  08/12/2014  FINDINGS: Interval increase in consolidation at the right lung base. Retrocardiac opacity is also identified. Patient's head obscures detail over the apices. Heart size is upper limits of normal. Evidence of vertebral augmentation. Chronic right humeral dislocation. Right apical skin fold simulates a pneumothorax.  IMPRESSION: Increased right greater than left lower lobe consolidation, suspicious for progressive  pneumonia. Followup to radiographic resolution is recommended in 3-4 weeks to exclude underlying neoplasm.  Chronic right humeral dislocation.   Electronically Signed   By: Christiana Pellant M.D.   On: 09/10/2014 20:22   Impression: She has had episodes of aspiration pneumonia. She has multiple other medical problems including a nonhealing ulceration on her left ankle. She has severe osteoporosis. She has a history of sinus cancer. She has seizure disorder which is stable Active Problems:   * No active hospital problems. *     Plan: Continue current treatments      Bram Hottel L   09/26/2014, 9:01 AM

## 2014-10-20 ENCOUNTER — Other Ambulatory Visit (HOSPITAL_COMMUNITY)
Admission: RE | Admit: 2014-10-20 | Discharge: 2014-10-20 | Disposition: A | Discharge status: Home / Self Care | Payer: Medicare Other | Source: Skilled Nursing Facility | Attending: Internal Medicine | Admitting: Internal Medicine

## 2014-10-20 DIAGNOSIS — M6281 Muscle weakness (generalized): Secondary | ICD-10-CM | POA: Insufficient documentation

## 2014-10-23 LAB — WOUND CULTURE: GRAM STAIN: NONE SEEN

## 2014-10-24 NOTE — Progress Notes (Signed)
This is documentation of my visit at the skilled care facility on 10/23/2014. She is awake and alert. She has no new complaints. The lesion on her ankle had been more inflamed but looks better today. Culture is pending. She had been hospitalized recently for aspiration pneumonia and is doing better with that. She has no complaints.  Exam shows she is awake and alert. She is in a wheelchair. Her left eye has been surgically removed. Her chest is clear. Her heart is regular without gallop. The lesion on her ankle is still open but looks somewhat better than the last time I saw it.  She has multiple medical problems including seizures but none recently chronic aspiration with several bouts of aspiration pneumonia and ulceration on her right ankle which has improved somewhat severe osteoporosis with multiple vertebral compression fractures.  Continue current treatments. No changes in meds.

## 2014-12-06 ENCOUNTER — Other Ambulatory Visit (HOSPITAL_COMMUNITY)
Admission: RE | Admit: 2014-12-06 | Discharge: 2014-12-06 | Disposition: A | Discharge status: Home / Self Care | Payer: Medicare Other | Source: Ambulatory Visit | Attending: Pulmonary Disease | Admitting: Pulmonary Disease

## 2014-12-06 ENCOUNTER — Encounter (HOSPITAL_COMMUNITY)
Admission: RE | Admit: 2014-12-06 | Discharge: 2014-12-06 | Disposition: A | Discharge status: Home / Self Care | Payer: Medicare Other | Source: Skilled Nursing Facility | Attending: Pulmonary Disease | Admitting: Pulmonary Disease

## 2014-12-06 DIAGNOSIS — Z79899 Other long term (current) drug therapy: Secondary | ICD-10-CM | POA: Insufficient documentation

## 2014-12-07 ENCOUNTER — Encounter (HOSPITAL_COMMUNITY)
Admission: RE | Admit: 2014-12-07 | Discharge: 2014-12-07 | Disposition: A | Discharge status: Home / Self Care | Payer: Medicare Other | Source: Skilled Nursing Facility | Attending: Pulmonary Disease | Admitting: Pulmonary Disease

## 2014-12-08 NOTE — Progress Notes (Signed)
This is documentation of my visit of 12/07/2014 at the skilled care facility. She has now turned to 89. She has not had any seizures. No more fractures. She has not had any overt episodes of aspiration but she does chronically aspirate. She is having pain on her right ankle and she thinks that she hit her right ankle against her wheelchair. She has a chronic ulcerated area on her right ankle and this is more inflamed and he is draining some purulent material  She is awake and alert. She is sitting in a wheelchair. 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 Her chest is pretty clear. Heart is regular. The lesion on her right lateral malleolus is about a centimeter across and is draining some purulent material.  She has recurrence of a leg ulceration. This is draining purulent material and this has been cultured. She has chronic aspiration but nothing overt recently. She has seizure disorder which has been well controlled. She has severe osteoporosis and has had multiple fractures but none recently.  Cultured the purulent material. Continue aspiration precautions. Continue current medications. I think she'll need antibiotics for the leg but I'd like to see what grows first

## 2014-12-11 LAB — WOUND CULTURE: GRAM STAIN: NONE SEEN

## 2014-12-12 ENCOUNTER — Ambulatory Visit (HOSPITAL_COMMUNITY)
Admission: RE | Admit: 2014-12-12 | Discharge: 2014-12-12 | Disposition: A | Discharge status: Home / Self Care | Payer: Medicare Other | Source: Ambulatory Visit | Attending: Pulmonary Disease | Admitting: Pulmonary Disease

## 2014-12-12 ENCOUNTER — Other Ambulatory Visit (HOSPITAL_COMMUNITY): Payer: Self-pay | Admitting: Pulmonary Disease

## 2014-12-12 ENCOUNTER — Inpatient Hospital Stay
Admission: RE | Admit: 2014-12-12 | Discharge: 2015-05-07 | Disposition: A | Discharge status: Still In Hospital | Payer: Medicare Other | Source: Ambulatory Visit | Attending: Pulmonary Disease | Admitting: Pulmonary Disease

## 2014-12-12 ENCOUNTER — Inpatient Hospital Stay
Admission: RE | Admit: 2014-12-12 | Disposition: A | Discharge status: Skilled Nursing Facility | Payer: Medicare Other | Source: Ambulatory Visit | Admitting: Pulmonary Disease

## 2014-12-12 ENCOUNTER — Encounter (HOSPITAL_COMMUNITY)
Admission: RE | Admit: 2014-12-12 | Discharge: 2014-12-12 | Disposition: A | Discharge status: Home / Self Care | Payer: Medicare Other | Source: Skilled Nursing Facility | Attending: Pulmonary Disease | Admitting: Pulmonary Disease

## 2014-12-12 DIAGNOSIS — J189 Pneumonia, unspecified organism: Secondary | ICD-10-CM

## 2014-12-12 DIAGNOSIS — B999 Unspecified infectious disease: Secondary | ICD-10-CM

## 2014-12-12 DIAGNOSIS — R05 Cough: Principal | ICD-10-CM

## 2014-12-12 DIAGNOSIS — J690 Pneumonitis due to inhalation of food and vomit: Secondary | ICD-10-CM

## 2014-12-12 DIAGNOSIS — A4902 Methicillin resistant Staphylococcus aureus infection, unspecified site: Secondary | ICD-10-CM | POA: Insufficient documentation

## 2014-12-12 DIAGNOSIS — Z79899 Other long term (current) drug therapy: Secondary | ICD-10-CM | POA: Diagnosis not present

## 2014-12-12 DIAGNOSIS — R059 Cough, unspecified: Principal | ICD-10-CM

## 2014-12-12 DIAGNOSIS — R0989 Other specified symptoms and signs involving the circulatory and respiratory systems: Secondary | ICD-10-CM

## 2014-12-12 LAB — COMPREHENSIVE METABOLIC PANEL
ALBUMIN: 3.4 g/dL — AB (ref 3.5–5.0)
ALK PHOS: 61 U/L (ref 38–126)
ALT: 11 U/L — ABNORMAL LOW (ref 14–54)
ANION GAP: 8 (ref 5–15)
AST: 24 U/L (ref 15–41)
BUN: 46 mg/dL — AB (ref 6–20)
CO2: 37 mmol/L — AB (ref 22–32)
Calcium: 9.2 mg/dL (ref 8.9–10.3)
Chloride: 95 mmol/L — ABNORMAL LOW (ref 101–111)
Creatinine, Ser: 1.77 mg/dL — ABNORMAL HIGH (ref 0.44–1.00)
GFR calc Af Amer: 28 mL/min — ABNORMAL LOW (ref >60)
GFR calc non Af Amer: 24 mL/min — ABNORMAL LOW (ref >60)
GLUCOSE: 173 mg/dL — AB (ref 65–99)
Potassium: 4.7 mmol/L (ref 3.5–5.1)
SODIUM: 140 mmol/L (ref 135–145)
Total Bilirubin: 0.2 mg/dL — ABNORMAL LOW (ref 0.3–1.2)
Total Protein: 7.4 g/dL (ref 6.5–8.1)

## 2014-12-12 LAB — CBC WITH DIFFERENTIAL/PLATELET
Basophils Absolute: 0 10*3/uL (ref 0.0–0.1)
Basophils Relative: 0 % (ref 0–1)
EOS ABS: 0.4 10*3/uL (ref 0.0–0.7)
Eosinophils Relative: 4 % (ref 0–5)
HCT: 28.5 % — ABNORMAL LOW (ref 36.0–46.0)
Hemoglobin: 8.3 g/dL — ABNORMAL LOW (ref 12.0–15.0)
Lymphocytes Relative: 8 % — ABNORMAL LOW (ref 12–46)
Lymphs Abs: 0.7 10*3/uL (ref 0.7–4.0)
MCH: 27.4 pg (ref 26.0–34.0)
MCHC: 29.1 g/dL — AB (ref 30.0–36.0)
MCV: 94.1 fL (ref 78.0–100.0)
MONOS PCT: 4 % (ref 3–12)
Monocytes Absolute: 0.4 10*3/uL (ref 0.1–1.0)
NEUTROS ABS: 7.2 10*3/uL (ref 1.7–7.7)
Neutrophils Relative %: 84 % — ABNORMAL HIGH (ref 43–77)
PLATELETS: 160 10*3/uL (ref 150–400)
RBC: 3.03 MIL/uL — AB (ref 3.87–5.11)
RDW: 13.4 % (ref 11.5–15.5)
WBC: 8.7 10*3/uL (ref 4.0–10.5)

## 2014-12-12 NOTE — Progress Notes (Signed)
Peripherally Inserted Central Catheter/Midline Placement  The IV Nurse has discussed with the patient and/or persons authorized to consent for the patient, the purpose of this procedure and the potential benefits and risks involved with this procedure.  The benefits include less needle sticks, lab draws from the catheter and patient may be discharged home with the catheter.  Risks include, but not limited to, infection, bleeding, blood clot (thrombus formation), and puncture of an artery; nerve damage and irregular heat beat.  Alternatives to this procedure were also discussed.  PICC/Midline Placement Documentation  PICC / Midline Single Lumen 12/12/14 PICC Right Brachial 39 cm 2 cm (Active)  Indication for Insertion or Continuance of Line Administration of hyperosmolar/irritating solutions (i.e. TPN, Vancomycin, etc.) 12/12/2014 11:55 AM  Exposed Catheter (cm) 2 cm 12/12/2014 11:55 AM  Site Assessment Clean;Dry;Intact 12/12/2014 11:55 AM  Line Status Flushed;Saline locked;Blood return noted 12/12/2014 11:55 AM  Dressing Type Transparent 12/12/2014 11:55 AM  Dressing Status Clean;Dry;Intact 12/12/2014 11:55 AM  Dressing Change Due 12/19/14 12/12/2014 11:55 AM    Difficult to determine placement with ECG technology due to patients anatomy. Xray obtained ssaying to retract picc at least 7 cm, opted to do exchange unsuceesful. Suggest patient go to interventional radiology. Inserted peripheral iv so patient could get antibiotics until patient goes to [place] reported to staff nurse taking care of patient at [facility] center.   [name] 12/12/2014, 1:30 PM

## 2014-12-12 NOTE — Discharge Instructions (Signed)
PICC Home Guide A peripherally inserted central catheter (PICC) is a long, thin, flexible tube that is inserted into a vein in the upper arm. It is a form of intravenous (IV) access. It is considered to be a "central" line because the tip of the PICC ends in a large vein in your chest. This large vein is called the superior vena cava (SVC). The PICC tip ends in the SVC because there is a lot of blood flow in the SVC. This allows medicines and IV fluids to be quickly distributed throughout the body. The PICC is inserted using a sterile technique by a specially trained nurse or physician. After the PICC is inserted, a chest X-ray exam is done to be sure it is in the correct place.  A PICC may be placed for different reasons, such as:  To give medicines and liquid nutrition that can only be given through a central line. Examples are:  Certain antibiotic treatments.  Chemotherapy.  Total parenteral nutrition (TPN).  To take frequent blood samples.  To give IV fluids and blood products.  If there is difficulty placing a peripheral intravenous (PIV) catheter. If taken care of properly, a PICC can remain in place for several months. A PICC can also allow a person to go home from the hospital early. Medicine and PICC care can be managed at home by a family member or home health care team. WHAT PROBLEMS CAN HAPPEN WHEN I HAVE A PICC? Problems with a PICC can occasionally occur. These may include the following:  A blood clot (thrombus) forming in or at the tip of the PICC. This can cause the PICC to become clogged. A clot-dissolving medicine called tissue plasminogen activator (tPA) can be given through the PICC to help break up the clot.  Inflammation of the vein (phlebitis) in which the PICC is placed. Signs of inflammation may include redness, pain at the insertion site, red streaks, or being able to feel a "cord" in the vein where the PICC is located.  Infection in the PICC or at the insertion  site. Signs of infection may include fever, chills, redness, swelling, or pus drainage from the PICC insertion site.  PICC movement (malposition). The PICC tip may move from its original position due to excessive physical activity, forceful coughing, sneezing, or vomiting.  A break or cut in the PICC. It is important to not use scissors near the PICC.  Nerve or tendon irritation or injury during PICC insertion. WHAT SHOULD I KEEP IN MIND ABOUT ACTIVITIES WHEN I HAVE A PICC?  You may bend your arm and move it freely. If your PICC is near or at the bend of your elbow, avoid activity with repeated motion at the elbow.  Rest at home for the remainder of the day following PICC line insertion.  Avoid lifting heavy objects as instructed by your health care provider.  Avoid using a crutch with the arm on the same side as your PICC. You may need to use a walker. WHAT SHOULD I KNOW ABOUT MY PICC DRESSING?  Keep your PICC bandage (dressing) clean and dry to prevent infection.  Ask your health care provider when you may shower. Ask your health care provider to teach you how to wrap the PICC when you do take a shower.  Change the PICC dressing as instructed by your health care provider.  Change your PICC dressing if it becomes loose or wet. WHAT SHOULD I KNOW ABOUT PICC CARE?  Check the PICC insertion site   daily for leakage, redness, swelling, or pain.  Do not take a bath, swim, or use hot tubs when you have a PICC. Cover PICC line with clear plastic wrap and tape to keep it dry while showering.  Flush the PICC as directed by your health care provider. Let your health care provider know right away if the PICC is difficult to flush or does not flush. Do not use force to flush the PICC.  Do not use a syringe that is less than 10 mL to flush the PICC.  Never pull or tug on the PICC.  Avoid blood pressure checks on the arm with the PICC.  Keep your PICC identification card with you at all  times.  Do not take the PICC out yourself. Only a trained clinical professional should remove the PICC. SEEK IMMEDIATE MEDICAL CARE IF:  Your PICC is accidentally pulled all the way out. If this happens, cover the insertion site with a bandage or gauze dressing. Do not throw the PICC away. Your health care provider will need to inspect it.  Your PICC was tugged or pulled and has partially come out. Do not  push the PICC back in.  There is any type of drainage, redness, or swelling where the PICC enters the skin.  You cannot flush the PICC, it is difficult to flush, or the PICC leaks around the insertion site when it is flushed.  You hear a "flushing" sound when the PICC is flushed.  You have pain, discomfort, or numbness in your arm, shoulder, or jaw on the same side as the PICC.  You feel your heart "racing" or skipping beats.  You notice a hole or tear in the PICC.  You develop chills or a fever. MAKE SURE YOU:   Understand these instructions.  Will watch your condition.  Will get help right away if you are not doing well or get worse. Document Released: 11/10/2002 Document Revised: 09/20/2013 Document Reviewed: 01/11/2013 Exit[employer] Patient Information 2015 [employer]. This information is not intended to replace advice given to you by your health care provider. Make sure you discuss any questions you have with your health care provider.  

## 2014-12-13 ENCOUNTER — Inpatient Hospital Stay (HOSPITAL_COMMUNITY)
Admission: RE | Admit: 2014-12-13 | Discharge: 2014-12-13 | Disposition: A | Discharge status: Home / Self Care | Payer: Medicare Other | Source: Ambulatory Visit | Attending: Pulmonary Disease | Admitting: Pulmonary Disease

## 2014-12-13 DIAGNOSIS — B999 Unspecified infectious disease: Secondary | ICD-10-CM

## 2014-12-13 MED ORDER — LIDOCAINE HCL 1 % IJ SOLN
INTRAMUSCULAR | Status: AC
  Filled 2014-12-13: qty 20

## 2014-12-13 MED ORDER — HEPARIN SOD (PORK) LOCK FLUSH 100 UNIT/ML IV SOLN
INTRAVENOUS | Status: AC
  Filled 2014-12-13: qty 5

## 2014-12-13 NOTE — Procedures (Signed)
Successful placement of single lumen PICC line to right brachial vein. Length 32cm Tip at lower SVC/RA No complications Ready for use.  Pattricia Boss D PA-C

## 2014-12-16 ENCOUNTER — Encounter (HOSPITAL_COMMUNITY)
Admission: RE | Admit: 2014-12-16 | Discharge: 2014-12-16 | Disposition: A | Discharge status: Home / Self Care | Payer: Medicare Other | Source: Ambulatory Visit | Attending: Internal Medicine | Admitting: Internal Medicine

## 2014-12-16 DIAGNOSIS — Z79899 Other long term (current) drug therapy: Secondary | ICD-10-CM | POA: Diagnosis not present

## 2014-12-16 LAB — VANCOMYCIN, TROUGH: Vancomycin Tr: 19 ug/mL (ref 10.0–20.0)

## 2014-12-19 ENCOUNTER — Encounter (HOSPITAL_COMMUNITY)
Admission: AD | Admit: 2014-12-19 | Discharge: 2014-12-19 | Disposition: A | Discharge status: Home / Self Care | Payer: Medicare Other | Source: Skilled Nursing Facility | Attending: Internal Medicine | Admitting: Internal Medicine

## 2014-12-19 DIAGNOSIS — Z79899 Other long term (current) drug therapy: Secondary | ICD-10-CM | POA: Diagnosis present

## 2014-12-19 DIAGNOSIS — Z792 Long term (current) use of antibiotics: Secondary | ICD-10-CM | POA: Insufficient documentation

## 2014-12-19 LAB — COMPREHENSIVE METABOLIC PANEL
ALT: 11 U/L — ABNORMAL LOW (ref 14–54)
ANION GAP: 4 — AB (ref 5–15)
AST: 22 U/L (ref 15–41)
Albumin: 2.8 g/dL — ABNORMAL LOW (ref 3.5–5.0)
Alkaline Phosphatase: 47 U/L (ref 38–126)
BUN: 40 mg/dL — ABNORMAL HIGH (ref 6–20)
CHLORIDE: 100 mmol/L — AB (ref 101–111)
CO2: 36 mmol/L — ABNORMAL HIGH (ref 22–32)
CREATININE: 1.27 mg/dL — AB (ref 0.44–1.00)
Calcium: 8.7 mg/dL — ABNORMAL LOW (ref 8.9–10.3)
GFR calc Af Amer: 42 mL/min — ABNORMAL LOW (ref >60)
GFR calc non Af Amer: 36 mL/min — ABNORMAL LOW (ref >60)
Glucose, Bld: 89 mg/dL (ref 65–99)
Potassium: 4.9 mmol/L (ref 3.5–5.1)
Sodium: 140 mmol/L (ref 135–145)
TOTAL PROTEIN: 6.1 g/dL — AB (ref 6.5–8.1)
Total Bilirubin: 0.3 mg/dL (ref 0.3–1.2)

## 2014-12-20 ENCOUNTER — Other Ambulatory Visit (HOSPITAL_COMMUNITY)
Admission: RE | Admit: 2014-12-20 | Discharge: 2014-12-20 | Disposition: A | Discharge status: Home / Self Care | Payer: Medicare Other | Source: Skilled Nursing Facility | Attending: Internal Medicine | Admitting: Internal Medicine

## 2014-12-20 DIAGNOSIS — G8929 Other chronic pain: Secondary | ICD-10-CM | POA: Insufficient documentation

## 2014-12-20 DIAGNOSIS — M810 Age-related osteoporosis without current pathological fracture: Secondary | ICD-10-CM | POA: Insufficient documentation

## 2014-12-20 LAB — BASIC METABOLIC PANEL
Anion gap: 7 (ref 5–15)
BUN: 33 mg/dL — ABNORMAL HIGH (ref 6–20)
CO2: 34 mmol/L — AB (ref 22–32)
CREATININE: 1.22 mg/dL — AB (ref 0.44–1.00)
Calcium: 8.9 mg/dL (ref 8.9–10.3)
Chloride: 99 mmol/L — ABNORMAL LOW (ref 101–111)
GFR calc Af Amer: 44 mL/min — ABNORMAL LOW (ref >60)
GFR calc non Af Amer: 38 mL/min — ABNORMAL LOW (ref >60)
GLUCOSE: 83 mg/dL (ref 65–99)
Potassium: 4.8 mmol/L (ref 3.5–5.1)
SODIUM: 140 mmol/L (ref 135–145)

## 2014-12-20 LAB — VANCOMYCIN, TROUGH: VANCOMYCIN TR: 11 ug/mL (ref 10.0–20.0)

## 2014-12-23 ENCOUNTER — Encounter (HOSPITAL_COMMUNITY)
Admission: AD | Admit: 2014-12-23 | Discharge: 2014-12-23 | Disposition: A | Discharge status: Home / Self Care | Payer: Medicare Other | Source: Skilled Nursing Facility | Attending: Pulmonary Disease | Admitting: Pulmonary Disease

## 2014-12-23 DIAGNOSIS — Z79899 Other long term (current) drug therapy: Secondary | ICD-10-CM | POA: Diagnosis not present

## 2014-12-23 LAB — BASIC METABOLIC PANEL
ANION GAP: 7 (ref 5–15)
BUN: 33 mg/dL — ABNORMAL HIGH (ref 6–20)
CO2: 36 mmol/L — AB (ref 22–32)
Calcium: 8.8 mg/dL — ABNORMAL LOW (ref 8.9–10.3)
Chloride: 97 mmol/L — ABNORMAL LOW (ref 101–111)
Creatinine, Ser: 1.15 mg/dL — ABNORMAL HIGH (ref 0.44–1.00)
GFR calc Af Amer: 47 mL/min — ABNORMAL LOW (ref >60)
GFR calc non Af Amer: 41 mL/min — ABNORMAL LOW (ref >60)
Glucose, Bld: 100 mg/dL — ABNORMAL HIGH (ref 65–99)
Potassium: 4.9 mmol/L (ref 3.5–5.1)
SODIUM: 140 mmol/L (ref 135–145)

## 2014-12-23 LAB — VANCOMYCIN, TROUGH: VANCOMYCIN TR: 20 ug/mL (ref 10.0–20.0)

## 2014-12-26 DIAGNOSIS — Z79899 Other long term (current) drug therapy: Secondary | ICD-10-CM | POA: Diagnosis not present

## 2014-12-26 LAB — BASIC METABOLIC PANEL
Anion gap: 5 (ref 5–15)
BUN: 44 mg/dL — ABNORMAL HIGH (ref 6–20)
CHLORIDE: 98 mmol/L — AB (ref 101–111)
CO2: 38 mmol/L — ABNORMAL HIGH (ref 22–32)
CREATININE: 1.29 mg/dL — AB (ref 0.44–1.00)
Calcium: 8.7 mg/dL — ABNORMAL LOW (ref 8.9–10.3)
GFR, EST AFRICAN AMERICAN: 41 mL/min — AB (ref >60)
GFR, EST NON AFRICAN AMERICAN: 35 mL/min — AB (ref >60)
Glucose, Bld: 88 mg/dL (ref 65–99)
Potassium: 4.8 mmol/L (ref 3.5–5.1)
SODIUM: 141 mmol/L (ref 135–145)

## 2014-12-26 LAB — VANCOMYCIN, TROUGH: Vancomycin Tr: 8 ug/mL — ABNORMAL LOW (ref 10.0–20.0)

## 2014-12-27 ENCOUNTER — Encounter (HOSPITAL_COMMUNITY)
Admission: RE | Admit: 2014-12-27 | Discharge: 2014-12-27 | Disposition: A | Discharge status: Home / Self Care | Payer: Medicare Other | Source: Skilled Nursing Facility | Attending: Pulmonary Disease | Admitting: Pulmonary Disease

## 2014-12-27 DIAGNOSIS — Z792 Long term (current) use of antibiotics: Secondary | ICD-10-CM | POA: Insufficient documentation

## 2014-12-27 LAB — BUN: BUN: 42 mg/dL — AB (ref 6–20)

## 2014-12-27 LAB — CREATININE, SERUM
Creatinine, Ser: 1.21 mg/dL — ABNORMAL HIGH (ref 0.44–1.00)
GFR calc Af Amer: 44 mL/min — ABNORMAL LOW (ref >60)
GFR calc non Af Amer: 38 mL/min — ABNORMAL LOW (ref >60)

## 2014-12-27 LAB — CK: CK TOTAL: 60 U/L (ref 38–234)

## 2014-12-30 DIAGNOSIS — Z79899 Other long term (current) drug therapy: Secondary | ICD-10-CM | POA: Diagnosis not present

## 2014-12-30 LAB — CK: Total CK: 53 U/L (ref 38–234)

## 2015-01-02 ENCOUNTER — Encounter (HOSPITAL_COMMUNITY)
Admission: AD | Admit: 2015-01-02 | Discharge: 2015-01-02 | Disposition: A | Discharge status: Home / Self Care | Payer: Medicare Other | Source: Skilled Nursing Facility | Attending: Internal Medicine | Admitting: Internal Medicine

## 2015-01-02 DIAGNOSIS — Z79899 Other long term (current) drug therapy: Secondary | ICD-10-CM | POA: Diagnosis not present

## 2015-01-02 LAB — COMPREHENSIVE METABOLIC PANEL WITH GFR
ALT: 11 U/L — ABNORMAL LOW (ref 14–54)
AST: 20 U/L (ref 15–41)
Albumin: 2.7 g/dL — ABNORMAL LOW (ref 3.5–5.0)
Alkaline Phosphatase: 49 U/L (ref 38–126)
Anion gap: 3 — ABNORMAL LOW (ref 5–15)
BUN: 40 mg/dL — ABNORMAL HIGH (ref 6–20)
CO2: 41 mmol/L — ABNORMAL HIGH (ref 22–32)
Calcium: 8.9 mg/dL (ref 8.9–10.3)
Chloride: 98 mmol/L — ABNORMAL LOW (ref 101–111)
Creatinine, Ser: 1.2 mg/dL — ABNORMAL HIGH (ref 0.44–1.00)
GFR calc Af Amer: 45 mL/min — ABNORMAL LOW
GFR calc non Af Amer: 39 mL/min — ABNORMAL LOW
Glucose, Bld: 89 mg/dL (ref 65–99)
Potassium: 5 mmol/L (ref 3.5–5.1)
Sodium: 142 mmol/L (ref 135–145)
Total Bilirubin: 0.4 mg/dL (ref 0.3–1.2)
Total Protein: 6 g/dL — ABNORMAL LOW (ref 6.5–8.1)

## 2015-01-02 NOTE — Progress Notes (Signed)
This is documentation of my visit from 01/01/2015 at the skilled care facility. She has an ulceration on her right lateral malleolus and she has now grown methicillin-resistant staph from that. She is being treated with IV antibiotics and it looks better. She did slide out of her chair the other day but did not suffer any sort of an injury. She says she feels better in general. She has no other new complaints. She has not aspirated overtly recently and she has not had any seizures  She is awake and alert. 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 She is in a wheelchair. She is thin. Her chest is clear. Her heart is regular. The ulceration on her right lateral malleolus is much improved but is still open with minimal erythema.  She has multiple medical problems including seizure disorder but no seizures recently this is of course being treated. She's had bouts of respiratory failure associated with aspiration pneumonia. She has severe osteoporosis but that's been treated and she's doing better. 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

## 2015-01-03 DIAGNOSIS — Z79899 Other long term (current) drug therapy: Secondary | ICD-10-CM | POA: Diagnosis not present

## 2015-01-03 LAB — CK: CK TOTAL: 42 U/L (ref 38–234)

## 2015-01-03 LAB — CREATININE, SERUM
CREATININE: 1.12 mg/dL — AB (ref 0.44–1.00)
GFR calc Af Amer: 49 mL/min — ABNORMAL LOW (ref >60)
GFR calc non Af Amer: 42 mL/min — ABNORMAL LOW (ref >60)

## 2015-01-03 LAB — BUN: BUN: 39 mg/dL — ABNORMAL HIGH (ref 6–20)

## 2015-01-09 ENCOUNTER — Encounter (HOSPITAL_COMMUNITY)
Admission: AD | Admit: 2015-01-09 | Discharge: 2015-01-09 | Disposition: A | Discharge status: Home / Self Care | Payer: Medicare Other | Source: Skilled Nursing Facility | Attending: Internal Medicine | Admitting: Internal Medicine

## 2015-01-09 DIAGNOSIS — Z79899 Other long term (current) drug therapy: Secondary | ICD-10-CM | POA: Diagnosis not present

## 2015-01-09 LAB — COMPREHENSIVE METABOLIC PANEL
ALT: 14 U/L (ref 14–54)
AST: 27 U/L (ref 15–41)
Albumin: 2.9 g/dL — ABNORMAL LOW (ref 3.5–5.0)
Alkaline Phosphatase: 57 U/L (ref 38–126)
Anion gap: 5 (ref 5–15)
BUN: 36 mg/dL — ABNORMAL HIGH (ref 6–20)
CHLORIDE: 98 mmol/L — AB (ref 101–111)
CO2: 35 mmol/L — AB (ref 22–32)
Calcium: 8.7 mg/dL — ABNORMAL LOW (ref 8.9–10.3)
Creatinine, Ser: 1.29 mg/dL — ABNORMAL HIGH (ref 0.44–1.00)
GFR, EST AFRICAN AMERICAN: 41 mL/min — AB (ref >60)
GFR, EST NON AFRICAN AMERICAN: 35 mL/min — AB (ref >60)
Glucose, Bld: 93 mg/dL (ref 65–99)
POTASSIUM: 5.1 mmol/L (ref 3.5–5.1)
SODIUM: 138 mmol/L (ref 135–145)
Total Bilirubin: 0.4 mg/dL (ref 0.3–1.2)
Total Protein: 6.4 g/dL — ABNORMAL LOW (ref 6.5–8.1)

## 2015-01-10 ENCOUNTER — Encounter (HOSPITAL_COMMUNITY)
Admission: AD | Admit: 2015-01-10 | Discharge: 2015-01-10 | Disposition: A | Discharge status: Home / Self Care | Payer: Medicare Other | Source: Skilled Nursing Facility | Attending: Internal Medicine | Admitting: Internal Medicine

## 2015-01-10 DIAGNOSIS — Z79899 Other long term (current) drug therapy: Secondary | ICD-10-CM | POA: Diagnosis not present

## 2015-01-10 LAB — CREATININE, SERUM
Creatinine, Ser: 1.27 mg/dL — ABNORMAL HIGH (ref 0.44–1.00)
GFR calc Af Amer: 42 mL/min — ABNORMAL LOW (ref >60)
GFR, EST NON AFRICAN AMERICAN: 36 mL/min — AB (ref >60)

## 2015-01-10 LAB — BUN: BUN: 41 mg/dL — AB (ref 6–20)

## 2015-01-10 LAB — CK: CK TOTAL: 36 U/L — AB (ref 38–234)

## 2015-01-13 ENCOUNTER — Encounter (HOSPITAL_COMMUNITY)
Admission: AD | Admit: 2015-01-13 | Discharge: 2015-01-13 | Disposition: A | Discharge status: Home / Self Care | Payer: Medicare Other | Source: Skilled Nursing Facility | Attending: Internal Medicine | Admitting: Internal Medicine

## 2015-01-13 DIAGNOSIS — Z79899 Other long term (current) drug therapy: Secondary | ICD-10-CM | POA: Diagnosis not present

## 2015-01-13 LAB — BUN: BUN: 47 mg/dL — AB (ref 6–20)

## 2015-01-13 LAB — CK TOTAL AND CKMB (NOT AT ARMC)
CK, MB: 2.3 ng/mL (ref 0.5–5.0)
Relative Index: INVALID (ref 0.0–2.5)
Total CK: 34 U/L — ABNORMAL LOW (ref 38–234)

## 2015-01-13 LAB — CREATININE, SERUM
CREATININE: 1.44 mg/dL — AB (ref 0.44–1.00)
GFR, EST AFRICAN AMERICAN: 36 mL/min — AB (ref >60)
GFR, EST NON AFRICAN AMERICAN: 31 mL/min — AB (ref >60)

## 2015-01-16 ENCOUNTER — Encounter (HOSPITAL_COMMUNITY)
Admission: RE | Admit: 2015-01-16 | Discharge: 2015-01-16 | Disposition: A | Discharge status: Home / Self Care | Payer: Medicare Other | Source: Skilled Nursing Facility | Attending: Internal Medicine | Admitting: Internal Medicine

## 2015-01-16 DIAGNOSIS — Z79899 Other long term (current) drug therapy: Secondary | ICD-10-CM | POA: Diagnosis not present

## 2015-01-16 LAB — COMPREHENSIVE METABOLIC PANEL
ALT: 12 U/L — ABNORMAL LOW (ref 14–54)
ANION GAP: 4 — AB (ref 5–15)
AST: 22 U/L (ref 15–41)
Albumin: 2.8 g/dL — ABNORMAL LOW (ref 3.5–5.0)
Alkaline Phosphatase: 61 U/L (ref 38–126)
BUN: 56 mg/dL — ABNORMAL HIGH (ref 6–20)
CHLORIDE: 98 mmol/L — AB (ref 101–111)
CO2: 38 mmol/L — ABNORMAL HIGH (ref 22–32)
Calcium: 8.7 mg/dL — ABNORMAL LOW (ref 8.9–10.3)
Creatinine, Ser: 1.36 mg/dL — ABNORMAL HIGH (ref 0.44–1.00)
GFR, EST AFRICAN AMERICAN: 38 mL/min — AB (ref >60)
GFR, EST NON AFRICAN AMERICAN: 33 mL/min — AB (ref >60)
Glucose, Bld: 78 mg/dL (ref 65–99)
POTASSIUM: 5.3 mmol/L — AB (ref 3.5–5.1)
SODIUM: 140 mmol/L (ref 135–145)
Total Bilirubin: 0.3 mg/dL (ref 0.3–1.2)
Total Protein: 6.2 g/dL — ABNORMAL LOW (ref 6.5–8.1)

## 2015-01-16 LAB — CK: Total CK: 32 U/L — ABNORMAL LOW (ref 38–234)

## 2015-01-18 ENCOUNTER — Encounter (HOSPITAL_COMMUNITY)
Admission: RE | Admit: 2015-01-18 | Discharge: 2015-01-18 | Disposition: A | Discharge status: Home / Self Care | Payer: Medicare Other | Source: Skilled Nursing Facility | Attending: Pulmonary Disease | Admitting: Pulmonary Disease

## 2015-01-18 DIAGNOSIS — Z79899 Other long term (current) drug therapy: Secondary | ICD-10-CM | POA: Diagnosis not present

## 2015-01-18 LAB — CREATININE, SERUM
Creatinine, Ser: 1.4 mg/dL — ABNORMAL HIGH (ref 0.44–1.00)
GFR calc Af Amer: 37 mL/min — ABNORMAL LOW (ref >60)
GFR calc non Af Amer: 32 mL/min — ABNORMAL LOW (ref >60)

## 2015-01-18 LAB — BUN: BUN: 48 mg/dL — AB (ref 6–20)

## 2015-01-18 LAB — CK: Total CK: 43 U/L (ref 38–234)

## 2015-01-20 ENCOUNTER — Other Ambulatory Visit (HOSPITAL_COMMUNITY)
Admission: RE | Admit: 2015-01-20 | Discharge: 2015-01-20 | Disposition: A | Discharge status: Home / Self Care | Payer: Medicare Other | Source: Ambulatory Visit | Attending: Pulmonary Disease | Admitting: Pulmonary Disease

## 2015-01-20 DIAGNOSIS — Z792 Long term (current) use of antibiotics: Secondary | ICD-10-CM | POA: Insufficient documentation

## 2015-01-20 LAB — CREATININE, SERUM
Creatinine, Ser: 1.41 mg/dL — ABNORMAL HIGH (ref 0.44–1.00)
GFR calc Af Amer: 37 mL/min — ABNORMAL LOW (ref >60)
GFR calc non Af Amer: 32 mL/min — ABNORMAL LOW (ref >60)

## 2015-01-20 LAB — CK: CK TOTAL: 28 U/L — AB (ref 38–234)

## 2015-01-20 LAB — BUN: BUN: 51 mg/dL — AB (ref 6–20)

## 2015-01-23 ENCOUNTER — Encounter (HOSPITAL_COMMUNITY)
Admission: AD | Admit: 2015-01-23 | Discharge: 2015-01-23 | Disposition: A | Discharge status: Home / Self Care | Payer: Medicare Other | Source: Skilled Nursing Facility | Attending: Pulmonary Disease | Admitting: Pulmonary Disease

## 2015-01-23 DIAGNOSIS — Z792 Long term (current) use of antibiotics: Secondary | ICD-10-CM | POA: Insufficient documentation

## 2015-01-23 LAB — CK: Total CK: 37 U/L — ABNORMAL LOW (ref 38–234)

## 2015-01-23 LAB — CREATININE, SERUM
CREATININE: 1.41 mg/dL — AB (ref 0.44–1.00)
GFR calc Af Amer: 37 mL/min — ABNORMAL LOW (ref >60)
GFR calc non Af Amer: 32 mL/min — ABNORMAL LOW (ref >60)

## 2015-01-23 LAB — BUN: BUN: 48 mg/dL — AB (ref 6–20)

## 2015-01-24 ENCOUNTER — Encounter (HOSPITAL_COMMUNITY)
Admission: RE | Admit: 2015-01-24 | Discharge: 2015-01-24 | Disposition: A | Discharge status: Home / Self Care | Payer: Medicare Other | Source: Ambulatory Visit | Attending: Pulmonary Disease | Admitting: Pulmonary Disease

## 2015-01-25 ENCOUNTER — Encounter (HOSPITAL_COMMUNITY)
Admission: AD | Admit: 2015-01-25 | Discharge: 2015-01-25 | Disposition: A | Discharge status: Home / Self Care | Payer: Medicare Other | Source: Skilled Nursing Facility | Attending: Pulmonary Disease | Admitting: Pulmonary Disease

## 2015-01-25 DIAGNOSIS — Z792 Long term (current) use of antibiotics: Secondary | ICD-10-CM | POA: Diagnosis not present

## 2015-01-25 LAB — CREATININE, SERUM
Creatinine, Ser: 1.27 mg/dL — ABNORMAL HIGH (ref 0.44–1.00)
GFR, EST AFRICAN AMERICAN: 42 mL/min — AB (ref >60)
GFR, EST NON AFRICAN AMERICAN: 36 mL/min — AB (ref >60)

## 2015-01-25 LAB — CK: CK TOTAL: 26 U/L — AB (ref 38–234)

## 2015-01-25 LAB — BUN: BUN: 46 mg/dL — ABNORMAL HIGH (ref 6–20)

## 2015-01-27 ENCOUNTER — Other Ambulatory Visit (HOSPITAL_COMMUNITY)
Admission: RE | Admit: 2015-01-27 | Discharge: 2015-01-27 | Disposition: A | Discharge status: Home / Self Care | Payer: Medicare Other | Source: Skilled Nursing Facility | Attending: Pulmonary Disease | Admitting: Pulmonary Disease

## 2015-01-27 DIAGNOSIS — Z792 Long term (current) use of antibiotics: Secondary | ICD-10-CM | POA: Insufficient documentation

## 2015-01-27 LAB — CK: CK TOTAL: 25 U/L — AB (ref 38–234)

## 2015-01-27 LAB — CREATININE, SERUM
CREATININE: 1.42 mg/dL — AB (ref 0.44–1.00)
GFR calc Af Amer: 36 mL/min — ABNORMAL LOW (ref >60)
GFR, EST NON AFRICAN AMERICAN: 31 mL/min — AB (ref >60)

## 2015-01-27 LAB — BUN: BUN: 55 mg/dL — AB (ref 6–20)

## 2015-01-29 NOTE — Progress Notes (Addendum)
She says she feels okay. She is still having trouble with an ulcer on her ankle but it does seem to be better. She has no other new complaints. No seizures. This is documentation of my visit at the skilled care facility of 01/28/2015  Exam shows that she is awake and alert. She sitting in her wheelchair. The lace on her heel looks much better. Her chest is clear. 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 She is improving. She has seizure disorder but no seizures recently. She has chronic aspiration but no episodes of aspiration recently. 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  No change in treatments. She appears to be improving slowly

## 2015-01-30 ENCOUNTER — Encounter (HOSPITAL_COMMUNITY)
Admission: RE | Admit: 2015-01-30 | Discharge: 2015-01-30 | Disposition: A | Discharge status: Home / Self Care | Payer: Medicare Other | Source: Skilled Nursing Facility | Attending: Pulmonary Disease | Admitting: Pulmonary Disease

## 2015-01-30 DIAGNOSIS — Z792 Long term (current) use of antibiotics: Secondary | ICD-10-CM | POA: Diagnosis not present

## 2015-01-30 LAB — CREATININE, SERUM
Creatinine, Ser: 1.34 mg/dL — ABNORMAL HIGH (ref 0.44–1.00)
GFR calc Af Amer: 39 mL/min — ABNORMAL LOW (ref >60)
GFR calc non Af Amer: 34 mL/min — ABNORMAL LOW (ref >60)

## 2015-01-30 LAB — CK: CK TOTAL: 26 U/L — AB (ref 38–234)

## 2015-01-30 LAB — BUN: BUN: 55 mg/dL — AB (ref 6–20)

## 2015-01-31 ENCOUNTER — Encounter (HOSPITAL_COMMUNITY)
Admission: AD | Admit: 2015-01-31 | Discharge: 2015-01-31 | Disposition: A | Discharge status: Home / Self Care | Payer: Medicare Other | Source: Skilled Nursing Facility | Attending: Pulmonary Disease | Admitting: Pulmonary Disease

## 2015-02-01 ENCOUNTER — Encounter (HOSPITAL_COMMUNITY)
Admission: RE | Admit: 2015-02-01 | Discharge: 2015-02-01 | Disposition: A | Discharge status: Home / Self Care | Payer: Medicare Other | Source: Skilled Nursing Facility | Attending: Pulmonary Disease | Admitting: Pulmonary Disease

## 2015-02-01 DIAGNOSIS — Z792 Long term (current) use of antibiotics: Secondary | ICD-10-CM | POA: Diagnosis not present

## 2015-02-01 LAB — BUN: BUN: 62 mg/dL — AB (ref 6–20)

## 2015-02-01 LAB — CK: CK TOTAL: 31 U/L — AB (ref 38–234)

## 2015-02-01 LAB — CREATININE, SERUM
Creatinine, Ser: 1.52 mg/dL — ABNORMAL HIGH (ref 0.44–1.00)
GFR calc Af Amer: 34 mL/min — ABNORMAL LOW (ref >60)
GFR calc non Af Amer: 29 mL/min — ABNORMAL LOW (ref >60)

## 2015-02-03 ENCOUNTER — Other Ambulatory Visit (HOSPITAL_COMMUNITY)
Admission: RE | Admit: 2015-02-03 | Discharge: 2015-02-03 | Disposition: A | Discharge status: Home / Self Care | Payer: Medicare Other | Source: Skilled Nursing Facility | Attending: Pulmonary Disease | Admitting: Pulmonary Disease

## 2015-02-03 DIAGNOSIS — Z792 Long term (current) use of antibiotics: Secondary | ICD-10-CM | POA: Diagnosis present

## 2015-02-03 LAB — CREATININE, SERUM
CREATININE: 1.27 mg/dL — AB (ref 0.44–1.00)
GFR, EST AFRICAN AMERICAN: 42 mL/min — AB (ref >60)
GFR, EST NON AFRICAN AMERICAN: 36 mL/min — AB (ref >60)

## 2015-02-03 LAB — BUN: BUN: 45 mg/dL — ABNORMAL HIGH (ref 6–20)

## 2015-02-03 LAB — CK: Total CK: 32 U/L — ABNORMAL LOW (ref 38–234)

## 2015-02-06 ENCOUNTER — Encounter (HOSPITAL_COMMUNITY)
Admission: RE | Admit: 2015-02-06 | Discharge: 2015-02-06 | Disposition: A | Discharge status: Home / Self Care | Payer: Medicare Other | Source: Skilled Nursing Facility | Attending: Internal Medicine | Admitting: Internal Medicine

## 2015-02-06 DIAGNOSIS — Z792 Long term (current) use of antibiotics: Secondary | ICD-10-CM | POA: Diagnosis not present

## 2015-02-06 LAB — CREATININE, SERUM
CREATININE: 1.28 mg/dL — AB (ref 0.44–1.00)
GFR, EST AFRICAN AMERICAN: 41 mL/min — AB (ref >60)
GFR, EST NON AFRICAN AMERICAN: 36 mL/min — AB (ref >60)

## 2015-02-06 LAB — BUN: BUN: 45 mg/dL — AB (ref 6–20)

## 2015-02-06 LAB — CK: Total CK: 36 U/L — ABNORMAL LOW (ref 38–234)

## 2015-02-09 ENCOUNTER — Encounter (HOSPITAL_COMMUNITY)
Admission: RE | Admit: 2015-02-09 | Discharge: 2015-02-09 | Disposition: A | Discharge status: Home / Self Care | Payer: Medicare Other | Source: Skilled Nursing Facility | Attending: Pulmonary Disease | Admitting: Pulmonary Disease

## 2015-02-09 ENCOUNTER — Encounter (HOSPITAL_COMMUNITY)
Admit: 2015-02-09 | Discharge: 2015-02-09 | Disposition: A | Discharge status: Home / Self Care | Payer: Medicare Other | Source: Ambulatory Visit | Attending: Pulmonary Disease | Admitting: Pulmonary Disease

## 2015-02-09 DIAGNOSIS — J9690 Respiratory failure, unspecified, unspecified whether with hypoxia or hypercapnia: Secondary | ICD-10-CM | POA: Diagnosis not present

## 2015-02-09 DIAGNOSIS — R569 Unspecified convulsions: Secondary | ICD-10-CM | POA: Diagnosis present

## 2015-02-09 DIAGNOSIS — R1310 Dysphagia, unspecified: Secondary | ICD-10-CM | POA: Insufficient documentation

## 2015-02-09 DIAGNOSIS — E079 Disorder of thyroid, unspecified: Secondary | ICD-10-CM | POA: Insufficient documentation

## 2015-02-09 DIAGNOSIS — Z8522 Personal history of malignant neoplasm of nasal cavities, middle ear, and accessory sinuses: Secondary | ICD-10-CM | POA: Diagnosis not present

## 2015-02-09 DIAGNOSIS — M810 Age-related osteoporosis without current pathological fracture: Secondary | ICD-10-CM | POA: Diagnosis not present

## 2015-02-09 DIAGNOSIS — K219 Gastro-esophageal reflux disease without esophagitis: Secondary | ICD-10-CM | POA: Insufficient documentation

## 2015-02-09 DIAGNOSIS — Z792 Long term (current) use of antibiotics: Secondary | ICD-10-CM | POA: Diagnosis not present

## 2015-02-09 LAB — CBC WITH DIFFERENTIAL/PLATELET
BASOS ABS: 0 10*3/uL (ref 0.0–0.1)
BASOS PCT: 1 %
EOS ABS: 0.4 10*3/uL (ref 0.0–0.7)
EOS PCT: 7 %
HCT: 22.2 % — ABNORMAL LOW (ref 36.0–46.0)
Hemoglobin: 6.4 g/dL — CL (ref 12.0–15.0)
Lymphocytes Relative: 16 %
Lymphs Abs: 0.9 10*3/uL (ref 0.7–4.0)
MCH: 26.3 pg (ref 26.0–34.0)
MCHC: 28.8 g/dL — ABNORMAL LOW (ref 30.0–36.0)
MCV: 91.4 fL (ref 78.0–100.0)
Monocytes Absolute: 0.5 10*3/uL (ref 0.1–1.0)
Monocytes Relative: 10 %
NEUTROS PCT: 65 %
Neutro Abs: 3.4 10*3/uL (ref 1.7–7.7)
PLATELETS: 197 10*3/uL (ref 150–400)
RBC: 2.43 MIL/uL — AB (ref 3.87–5.11)
RDW: 13.5 % (ref 11.5–15.5)
WBC: 5.2 10*3/uL (ref 4.0–10.5)

## 2015-02-09 LAB — HEMOGLOBIN AND HEMATOCRIT, BLOOD
HCT: 24.2 % — ABNORMAL LOW (ref 36.0–46.0)
Hemoglobin: 7 g/dL — ABNORMAL LOW (ref 12.0–15.0)

## 2015-02-09 LAB — PREPARE RBC (CROSSMATCH)

## 2015-02-09 LAB — OCCULT BLOOD X 1 CARD TO LAB, STOOL: FECAL OCCULT BLD: NEGATIVE

## 2015-02-09 NOTE — Progress Notes (Signed)
Results for [name], [name] (MRN [id number]) as of 02/09/2015 13:05  Ref. Range 02/09/2015 11:51  Hemoglobin Latest Ref Range: 12.0-15.0 g/dL 7.0 (L)  HCT Latest Ref Range: 36.0-46.0 % 24.2 (L)

## 2015-02-10 ENCOUNTER — Encounter (HOSPITAL_COMMUNITY)
Admit: 2015-02-10 | Discharge: 2015-02-10 | Disposition: A | Discharge status: Home / Self Care | Payer: Medicare Other | Attending: Pulmonary Disease | Admitting: Pulmonary Disease

## 2015-02-10 DIAGNOSIS — R569 Unspecified convulsions: Secondary | ICD-10-CM | POA: Diagnosis not present

## 2015-02-10 MED ORDER — SODIUM CHLORIDE 0.9 % IV SOLN
Freq: Once | INTRAVENOUS | Status: AC
  Administered 2015-02-10: 250 mL via INTRAVENOUS

## 2015-02-10 NOTE — Progress Notes (Signed)
Breakfast tray brought to pt from [facility] staff. Pt ate oatmeal and part of biscuit. Tolerated well. Meds given per [facility] staff.

## 2015-02-10 NOTE — Progress Notes (Signed)
#  2 unit PRBC's completed. Transferred to w/c. To BR. Assisted to BR. Voided without difficulty. Report called to [facility] LPN. Will be here to transfer pt to [facility].

## 2015-02-11 LAB — TYPE AND SCREEN
ABO/RH(D): A POS
ANTIBODY SCREEN: NEGATIVE
UNIT DIVISION: 0
UNIT DIVISION: 0

## 2015-02-12 ENCOUNTER — Encounter (HOSPITAL_COMMUNITY)
Admission: RE | Admit: 2015-02-12 | Discharge: 2015-02-12 | Disposition: A | Discharge status: Home / Self Care | Payer: Medicare Other | Source: Skilled Nursing Facility | Attending: Pulmonary Disease | Admitting: Pulmonary Disease

## 2015-02-12 DIAGNOSIS — Z792 Long term (current) use of antibiotics: Secondary | ICD-10-CM | POA: Diagnosis not present

## 2015-02-12 LAB — OCCULT BLOOD X 1 CARD TO LAB, STOOL: FECAL OCCULT BLD: NEGATIVE

## 2015-02-14 ENCOUNTER — Encounter (HOSPITAL_COMMUNITY)
Admission: AD | Admit: 2015-02-14 | Discharge: 2015-02-14 | Disposition: A | Discharge status: Home / Self Care | Payer: Medicare Other | Source: Skilled Nursing Facility | Attending: Pulmonary Disease | Admitting: Pulmonary Disease

## 2015-02-14 DIAGNOSIS — Z792 Long term (current) use of antibiotics: Secondary | ICD-10-CM | POA: Diagnosis not present

## 2015-02-14 LAB — OCCULT BLOOD X 1 CARD TO LAB, STOOL: FECAL OCCULT BLD: NEGATIVE

## 2015-02-15 ENCOUNTER — Encounter (HOSPITAL_COMMUNITY)
Admission: RE | Admit: 2015-02-15 | Discharge: 2015-02-15 | Disposition: A | Discharge status: Home / Self Care | Payer: Medicare Other | Source: Skilled Nursing Facility | Attending: Pulmonary Disease | Admitting: Pulmonary Disease

## 2015-02-15 DIAGNOSIS — Z792 Long term (current) use of antibiotics: Secondary | ICD-10-CM | POA: Diagnosis not present

## 2015-02-15 LAB — CBC WITH DIFFERENTIAL/PLATELET
Basophils Absolute: 0 10*3/uL (ref 0.0–0.1)
Basophils Relative: 1 %
EOS ABS: 0.4 10*3/uL (ref 0.0–0.7)
EOS PCT: 7 %
HCT: 33.7 % — ABNORMAL LOW (ref 36.0–46.0)
Hemoglobin: 9.7 g/dL — ABNORMAL LOW (ref 12.0–15.0)
LYMPHS ABS: 0.9 10*3/uL (ref 0.7–4.0)
LYMPHS PCT: 18 %
MCH: 27.2 pg (ref 26.0–34.0)
MCHC: 28.8 g/dL — ABNORMAL LOW (ref 30.0–36.0)
MCV: 94.7 fL (ref 78.0–100.0)
MONO ABS: 0.5 10*3/uL (ref 0.1–1.0)
Monocytes Relative: 9 %
Neutro Abs: 3.4 10*3/uL (ref 1.7–7.7)
Neutrophils Relative %: 66 %
PLATELETS: 173 10*3/uL (ref 150–400)
RBC: 3.56 MIL/uL — ABNORMAL LOW (ref 3.87–5.11)
RDW: 14 % (ref 11.5–15.5)
WBC: 5.2 10*3/uL (ref 4.0–10.5)

## 2015-03-08 NOTE — Progress Notes (Signed)
This is documentation of my visit at the skilled care facility of 03/05/2015. She says she feels a little better. She is known to have multiple medical problems including seizure disorder dysphagia with chronic aspiration severe osteoporosis with multiple fractures and MRSA infection of her right ankle. She has also had a cancer of her sinus which required enucleation of her left eye.  Exam shows she is awake and alert. She is sitting quietly in a wheelchair. Her left eye is missing. Her chest is clear. Her heart is regular. The wound on her ankle looks much better.  She has multiple medical problems as documented above. She has been anemic and this anemia is from chronic disease. She is doing a little better with that. I don't plan to change any of her treatments.

## 2015-03-10 ENCOUNTER — Ambulatory Visit (HOSPITAL_COMMUNITY)
Admission: RE | Admit: 2015-03-10 | Discharge: 2015-03-10 | Disposition: A | Discharge status: Home / Self Care | Payer: Medicare Other | Source: Ambulatory Visit | Attending: Pulmonary Disease | Admitting: Pulmonary Disease

## 2015-03-10 DIAGNOSIS — K449 Diaphragmatic hernia without obstruction or gangrene: Secondary | ICD-10-CM | POA: Diagnosis not present

## 2015-03-10 DIAGNOSIS — R0989 Other specified symptoms and signs involving the circulatory and respiratory systems: Secondary | ICD-10-CM | POA: Insufficient documentation

## 2015-03-10 DIAGNOSIS — R918 Other nonspecific abnormal finding of lung field: Secondary | ICD-10-CM | POA: Diagnosis not present

## 2015-03-11 ENCOUNTER — Other Ambulatory Visit (HOSPITAL_COMMUNITY)
Admission: RE | Admit: 2015-03-11 | Discharge: 2015-03-11 | Disposition: A | Discharge status: Home / Self Care | Payer: Medicare Other | Source: Skilled Nursing Facility | Attending: Pulmonary Disease | Admitting: Pulmonary Disease

## 2015-03-11 DIAGNOSIS — J449 Chronic obstructive pulmonary disease, unspecified: Secondary | ICD-10-CM | POA: Insufficient documentation

## 2015-03-11 LAB — CBC WITH DIFFERENTIAL/PLATELET
BASOS PCT: 0 %
Basophils Absolute: 0 10*3/uL (ref 0.0–0.1)
EOS ABS: 0.1 10*3/uL (ref 0.0–0.7)
EOS PCT: 1 %
HCT: 30.7 % — ABNORMAL LOW (ref 36.0–46.0)
HEMOGLOBIN: 9 g/dL — AB (ref 12.0–15.0)
LYMPHS ABS: 0.8 10*3/uL (ref 0.7–4.0)
Lymphocytes Relative: 9 %
MCH: 27.4 pg (ref 26.0–34.0)
MCHC: 29.3 g/dL — AB (ref 30.0–36.0)
MCV: 93.3 fL (ref 78.0–100.0)
MONO ABS: 0.7 10*3/uL (ref 0.1–1.0)
MONOS PCT: 8 %
Neutro Abs: 8 10*3/uL — ABNORMAL HIGH (ref 1.7–7.7)
Neutrophils Relative %: 83 %
PLATELETS: 166 10*3/uL (ref 150–400)
RBC: 3.29 MIL/uL — ABNORMAL LOW (ref 3.87–5.11)
RDW: 14.4 % (ref 11.5–15.5)
WBC: 9.7 10*3/uL (ref 4.0–10.5)

## 2015-03-24 ENCOUNTER — Ambulatory Visit (HOSPITAL_COMMUNITY): Payer: Medicare Other | Attending: Pulmonary Disease

## 2015-03-24 DIAGNOSIS — R05 Cough: Secondary | ICD-10-CM | POA: Diagnosis not present

## 2015-03-24 DIAGNOSIS — R0989 Other specified symptoms and signs involving the circulatory and respiratory systems: Secondary | ICD-10-CM | POA: Diagnosis not present

## 2015-04-04 ENCOUNTER — Encounter (HOSPITAL_COMMUNITY)
Admission: AD | Admit: 2015-04-04 | Discharge: 2015-04-04 | Disposition: A | Discharge status: Home / Self Care | Payer: Medicare Other | Source: Skilled Nursing Facility | Attending: Pulmonary Disease | Admitting: Pulmonary Disease

## 2015-04-04 DIAGNOSIS — Z79899 Other long term (current) drug therapy: Secondary | ICD-10-CM | POA: Insufficient documentation

## 2015-04-04 LAB — CBC WITH DIFFERENTIAL/PLATELET
Basophils Absolute: 0 10*3/uL (ref 0.0–0.1)
Basophils Relative: 1 %
Eosinophils Absolute: 0.4 10*3/uL (ref 0.0–0.7)
Eosinophils Relative: 8 %
HEMATOCRIT: 26.8 % — AB (ref 36.0–46.0)
HEMOGLOBIN: 7.8 g/dL — AB (ref 12.0–15.0)
Lymphocytes Relative: 12 %
Lymphs Abs: 0.6 10*3/uL — ABNORMAL LOW (ref 0.7–4.0)
MCH: 27.1 pg (ref 26.0–34.0)
MCHC: 29.1 g/dL — AB (ref 30.0–36.0)
MCV: 93.1 fL (ref 78.0–100.0)
MONOS PCT: 8 %
Monocytes Absolute: 0.4 10*3/uL (ref 0.1–1.0)
NEUTROS ABS: 3.5 10*3/uL (ref 1.7–7.7)
NEUTROS PCT: 71 %
Platelets: 184 10*3/uL (ref 150–400)
RBC: 2.88 MIL/uL — ABNORMAL LOW (ref 3.87–5.11)
RDW: 14.5 % (ref 11.5–15.5)
WBC: 4.9 10*3/uL (ref 4.0–10.5)

## 2015-04-10 ENCOUNTER — Encounter (HOSPITAL_COMMUNITY)
Admission: RE | Admit: 2015-04-10 | Discharge: 2015-04-10 | Disposition: A | Discharge status: Home / Self Care | Payer: Medicare Other | Source: Skilled Nursing Facility | Attending: Pulmonary Disease | Admitting: Pulmonary Disease

## 2015-04-10 DIAGNOSIS — Z79899 Other long term (current) drug therapy: Secondary | ICD-10-CM | POA: Diagnosis not present

## 2015-04-10 LAB — CBC WITH DIFFERENTIAL/PLATELET
BASOS ABS: 0 10*3/uL (ref 0.0–0.1)
Basophils Relative: 0 %
EOS PCT: 4 %
Eosinophils Absolute: 0.3 10*3/uL (ref 0.0–0.7)
HEMATOCRIT: 29.7 % — AB (ref 36.0–46.0)
Hemoglobin: 8 g/dL — ABNORMAL LOW (ref 12.0–15.0)
LYMPHS ABS: 0.7 10*3/uL (ref 0.7–4.0)
LYMPHS PCT: 10 %
MCH: 26.8 pg (ref 26.0–34.0)
MCHC: 26.9 g/dL — AB (ref 30.0–36.0)
MCV: 99.3 fL (ref 78.0–100.0)
MONO ABS: 0.4 10*3/uL (ref 0.1–1.0)
MONOS PCT: 5 %
Neutro Abs: 5.9 10*3/uL (ref 1.7–7.7)
Neutrophils Relative %: 81 %
PLATELETS: 154 10*3/uL (ref 150–400)
RBC: 2.99 MIL/uL — ABNORMAL LOW (ref 3.87–5.11)
RDW: 14.9 % (ref 11.5–15.5)
WBC: 7.3 10*3/uL (ref 4.0–10.5)

## 2015-04-10 NOTE — Progress Notes (Signed)
She is having more difficulties. She had an episode of pneumonia which was treated with antibiotics and she is better but still somewhat short of breath and still requiring oxygen. The Place on her ankle is better. She had an MRSA infection. She now has increasing problems with anemia which does not seem to be related to bleeding. I have requested evaluation by hematology/oncology. She seems more anxious  She is awake and alert. She is sitting in a wheelchair. Her chest shows some rhonchi but no wheezing. Her heart is regular without gallop. Her abdomen is soft. Her ankle wound is fairly clear with mostly now a scar  She has had pneumonia and is known to have chronic aspiration. This was treated successfully as an outpatient. She has a history of seizure disorder but no seizures recently. She had MRSA infection on her ankle and that has been treated with 6 weeks of IV antibiotics. She is still wearing oxygen. She had anemia and I am requesting evaluation for that I think it's probably anemia of chronic disease.  Send her to the oncology hematology team. Continue with other treatments. I told think there is really anything else to add at this time. She will have CBC tomorrow

## 2015-04-18 ENCOUNTER — Encounter (HOSPITAL_COMMUNITY)
Admission: RE | Admit: 2015-04-18 | Discharge: 2015-04-18 | Disposition: A | Discharge status: Home / Self Care | Payer: Medicare Other | Source: Skilled Nursing Facility | Attending: Pulmonary Disease | Admitting: Pulmonary Disease

## 2015-04-18 ENCOUNTER — Ambulatory Visit (HOSPITAL_COMMUNITY)
Admission: RE | Admit: 2015-04-18 | Discharge: 2015-04-18 | Disposition: A | Discharge status: Home / Self Care | Payer: Medicare Other | Source: Ambulatory Visit | Attending: Internal Medicine | Admitting: Internal Medicine

## 2015-04-18 DIAGNOSIS — I517 Cardiomegaly: Secondary | ICD-10-CM | POA: Diagnosis not present

## 2015-04-18 DIAGNOSIS — J189 Pneumonia, unspecified organism: Secondary | ICD-10-CM | POA: Diagnosis not present

## 2015-04-18 DIAGNOSIS — R0989 Other specified symptoms and signs involving the circulatory and respiratory systems: Secondary | ICD-10-CM | POA: Diagnosis not present

## 2015-04-18 DIAGNOSIS — K449 Diaphragmatic hernia without obstruction or gangrene: Secondary | ICD-10-CM | POA: Diagnosis not present

## 2015-04-18 DIAGNOSIS — R05 Cough: Secondary | ICD-10-CM | POA: Insufficient documentation

## 2015-04-18 DIAGNOSIS — Z79899 Other long term (current) drug therapy: Secondary | ICD-10-CM | POA: Diagnosis not present

## 2015-04-18 LAB — CBC WITH DIFFERENTIAL/PLATELET
BASOS ABS: 0 10*3/uL (ref 0.0–0.1)
BASOS PCT: 0 %
Eosinophils Absolute: 0.2 10*3/uL (ref 0.0–0.7)
Eosinophils Relative: 2 %
HEMATOCRIT: 29.6 % — AB (ref 36.0–46.0)
HEMOGLOBIN: 8.7 g/dL — AB (ref 12.0–15.0)
LYMPHS PCT: 9 %
Lymphs Abs: 0.6 10*3/uL — ABNORMAL LOW (ref 0.7–4.0)
MCH: 27.4 pg (ref 26.0–34.0)
MCHC: 29.4 g/dL — AB (ref 30.0–36.0)
MCV: 93.4 fL (ref 78.0–100.0)
MONO ABS: 0.4 10*3/uL (ref 0.1–1.0)
MONOS PCT: 6 %
NEUTROS ABS: 6.2 10*3/uL (ref 1.7–7.7)
NEUTROS PCT: 83 %
Platelets: 139 10*3/uL — ABNORMAL LOW (ref 150–400)
RBC: 3.17 MIL/uL — AB (ref 3.87–5.11)
RDW: 15.3 % (ref 11.5–15.5)
WBC: 7.5 10*3/uL (ref 4.0–10.5)

## 2015-04-26 ENCOUNTER — Ambulatory Visit (HOSPITAL_COMMUNITY): Payer: Self-pay | Admitting: Hematology & Oncology

## 2015-04-27 ENCOUNTER — Encounter (HOSPITAL_COMMUNITY): Payer: Medicare Other | Attending: Pulmonary Disease | Admitting: Hematology & Oncology

## 2015-04-27 ENCOUNTER — Encounter (HOSPITAL_COMMUNITY): Payer: Self-pay | Admitting: Hematology & Oncology

## 2015-04-27 VITALS — BP 97/76 | HR 102 | Temp 98.3°F | Resp 18

## 2015-04-27 DIAGNOSIS — L97419 Non-pressure chronic ulcer of right heel and midfoot with unspecified severity: Secondary | ICD-10-CM

## 2015-04-27 DIAGNOSIS — A4902 Methicillin resistant Staphylococcus aureus infection, unspecified site: Secondary | ICD-10-CM | POA: Diagnosis not present

## 2015-04-27 DIAGNOSIS — Z792 Long term (current) use of antibiotics: Secondary | ICD-10-CM | POA: Diagnosis not present

## 2015-04-27 DIAGNOSIS — R569 Unspecified convulsions: Secondary | ICD-10-CM

## 2015-04-27 DIAGNOSIS — D649 Anemia, unspecified: Secondary | ICD-10-CM | POA: Diagnosis present

## 2015-04-27 DIAGNOSIS — J690 Pneumonitis due to inhalation of food and vomit: Secondary | ICD-10-CM | POA: Diagnosis not present

## 2015-04-27 DIAGNOSIS — M810 Age-related osteoporosis without current pathological fracture: Secondary | ICD-10-CM | POA: Diagnosis not present

## 2015-04-27 LAB — VITAMIN B12: Vitamin B-12: 617 pg/mL (ref 180–914)

## 2015-04-27 LAB — CBC WITH DIFFERENTIAL/PLATELET
BASOS ABS: 0 10*3/uL (ref 0.0–0.1)
BASOS PCT: 1 %
EOS ABS: 0.3 10*3/uL (ref 0.0–0.7)
Eosinophils Relative: 5 %
HCT: 29.6 % — ABNORMAL LOW (ref 36.0–46.0)
HEMOGLOBIN: 8.4 g/dL — AB (ref 12.0–15.0)
Lymphocytes Relative: 12 %
Lymphs Abs: 0.8 10*3/uL (ref 0.7–4.0)
MCH: 27.5 pg (ref 26.0–34.0)
MCHC: 28.4 g/dL — ABNORMAL LOW (ref 30.0–36.0)
MCV: 96.7 fL (ref 78.0–100.0)
Monocytes Absolute: 0.3 10*3/uL (ref 0.1–1.0)
Monocytes Relative: 4 %
NEUTROS PCT: 78 %
Neutro Abs: 5 10*3/uL (ref 1.7–7.7)
Platelets: 180 10*3/uL (ref 150–400)
RBC: 3.06 MIL/uL — ABNORMAL LOW (ref 3.87–5.11)
RDW: 16.1 % — ABNORMAL HIGH (ref 11.5–15.5)
WBC: 6.3 10*3/uL (ref 4.0–10.5)

## 2015-04-27 LAB — COMPREHENSIVE METABOLIC PANEL
ALBUMIN: 3.1 g/dL — AB (ref 3.5–5.0)
ALK PHOS: 70 U/L (ref 38–126)
ALT: 11 U/L — AB (ref 14–54)
AST: 22 U/L (ref 15–41)
Anion gap: 7 (ref 5–15)
BUN: 52 mg/dL — ABNORMAL HIGH (ref 6–20)
CALCIUM: 9.8 mg/dL (ref 8.9–10.3)
CHLORIDE: 89 mmol/L — AB (ref 101–111)
CO2: 45 mmol/L — AB (ref 22–32)
CREATININE: 1.54 mg/dL — AB (ref 0.44–1.00)
GFR calc Af Amer: 33 mL/min — ABNORMAL LOW (ref >60)
GFR calc non Af Amer: 29 mL/min — ABNORMAL LOW (ref >60)
Glucose, Bld: 164 mg/dL — ABNORMAL HIGH (ref 65–99)
Potassium: 5.3 mmol/L — ABNORMAL HIGH (ref 3.5–5.1)
SODIUM: 141 mmol/L (ref 135–145)
Total Bilirubin: 0.3 mg/dL (ref 0.3–1.2)
Total Protein: 6.9 g/dL (ref 6.5–8.1)

## 2015-04-27 LAB — RETICULOCYTES
RBC.: 3.06 MIL/uL — ABNORMAL LOW (ref 3.87–5.11)
RETIC CT PCT: 1.1 % (ref 0.4–3.1)
Retic Count, Absolute: 33.7 10*3/uL (ref 19.0–186.0)

## 2015-04-27 LAB — FERRITIN: FERRITIN: 77 ng/mL (ref 11–307)

## 2015-04-27 LAB — LACTATE DEHYDROGENASE: LDH: 134 U/L (ref 98–192)

## 2015-04-27 LAB — SEDIMENTATION RATE: SED RATE: 102 mm/h — AB (ref 0–22)

## 2015-04-27 LAB — SAMPLE TO BLOOD BANK

## 2015-04-27 NOTE — Progress Notes (Signed)
[place] [facility] [place] NOTE  Patient Care Team: Sinda Du, MD as PCP - General (Internal Medicine)  CHIEF COMPLAINTS/PURPOSE OF CONSULTATION:  Severe anemia  Aspiration pneumonia Infected right heel ulcer/MRSA Chronic hypoxic respiratory failure on O2 Severe osteoporosis Seizures  HISTORY OF PRESENTING ILLNESS:  [name] 81 y.o. female is here because of severe anemia with Hgb as low as 6.4 on 02/09/15. Hemoglobin averages in the 7 to 8 gm/dl range. She has multiple co-momorbidities. She resides at the [facility].   [name] is accompanied by her two daughters and in a wheelchair. Her daughters spoke on her behalf today. During the visit, [name] spoke of the pain in her leg several times, saying "Please get the doctor to check my leg". She has been at the [facility] center for the last 12 years.  She states that her leg hurts. It'll be a year in January that she has been dealing with her right foot issue. Her daughter reports the leg looked better yesterday, but looks worse and more red today. She is currently on antibiotics for her foot.   Her daughter was curious whether her anemia had anything to do with her osteoporosis. Her daughter is also curious if it could be geriatric leukemia. When I spoke of a bone marrow biopsy, her daughter noted that is not something that they would be interested in.   Her daughter notes that when they were young and their mother was in her 30's, she went for B12 shots.   Her daughter reports that her blood count dropped to "6.1 something" about 2-3 months ago. They note that it is scary when their mother falls asleep mid-sentence and becomes very pale.   She has had problems with aspiration for the last 5-6 years. She is on mechanical softened diet and nectar beverages.   Her daughter notes that appointments in the morning would be better for their mother as she is often tired by the afternoon.   The are here for  further evaluation of her anemia but not other than peripheral labs will not be interested in pursuing a more "aggressive workup."  MEDICAL HISTORY:  Past Medical History  Diagnosis Date  . Seizures ([place])   . Aspiration pneumonia ([place])   . Dysphagia   . Osteoporosis   . Thyroid disease   . Depression   . MRSA (methicillin resistant staph aureus) culture positive   . Respiratory failure ([place])   . Hypokalemia   . Reflux   . Vertebral compression fracture ([facility])     thoracic and lumbar  . History of sinus cancer 1970's    left side  . Chronic pain   . Hypertension   . GERD (gastroesophageal reflux disease)     SURGICAL HISTORY: Past Surgical History  Procedure Laterality Date  . Kyphoplasty      Lumbar and thoracic  . Palate surgery  1970's    removal of hard and soft palate on left side  . Enucleation Left 1970's    left eye  . Cholecystectomy    . Appendectomy    . Cesarean section      SOCIAL HISTORY: Social History   Social History  . Marital Status: Widowed    Spouse Name: N/A  . Number of Children: N/A  . Years of Education: N/A   Occupational History  . Not on file.   Social History Main Topics  . Smoking status: Never Smoker   . Smokeless  tobacco: Not on file  . Alcohol Use: No  . Drug Use: No  . Sexual Activity: No   Other Topics Concern  . Not on file   Social History Narrative  Resides at [place]: Family History  Problem Relation Age of Onset  . Cancer Father   Non contributory has no family status information on file.   ALLERGIES:  is allergic to aricept; bactrim; ciprofloxacin; darvocet; namenda; penicillins; and vibramycin.  MEDICATIONS:  No current outpatient prescriptions on file.   No current facility-administered medications for this visit.    Review of Systems  Constitutional: Negative.   HENT: Negative.   Eyes: Negative.   Respiratory: Negative.   Cardiovascular: Negative.   Gastrointestinal:  Negative.   Genitourinary: Negative.   Musculoskeletal: Positive for joint pain.       Right leg pain.  Skin: Negative.   Neurological: Negative.   Endo/Heme/Allergies: Negative.   Psychiatric/Behavioral: Negative.   All other systems reviewed and are negative.  14 point ROS was done and is otherwise as detailed above or in HPI   PHYSICAL EXAMINATION: ECOG PERFORMANCE STATUS: 3 - Symptomatic, >50% confined to bed  Filed Vitals:   04/27/15 1500  BP: 97/76  Pulse: 102  Temp: 98.3 F (36.8 C)  Resp: 18    Physical Exam  Constitutional: She appears distressed.  Distressed about pain in right leg. Thin. Frail. Exam limited as performed in wheelchair  HENT:  Head: Normocephalic and atraumatic.  Mouth/Throat: Oropharynx is clear and moist.  Eyes: Conjunctivae and EOM are normal. Pupils are equal, round, and reactive to light.  Patch over left eye.  Neck: Normal range of motion. Neck supple.  Cardiovascular: Normal rate, regular rhythm and normal heart sounds.   Pulmonary/Chest: Effort normal. No respiratory distress.  Some rhonchi heard.  Abdominal: Soft. Bowel sounds are normal. She exhibits no distension.  Musculoskeletal:  Bandage on right heel, erythematous. RLE larger than the left. Arthritic changes in her hands. Very difficult for her to move her extremities.  Neurological: She is alert. No cranial nerve deficit.  Skin: Skin is warm and dry. No rash noted.  Right heel erythematous.  Psychiatric: Affect normal.  Nursing note and vitals reviewed.   LABORATORY DATA:  I have reviewed the data as listed Lab Results  Component Value Date   WBC 7.5 04/18/2015   HGB 8.7* 04/18/2015   HCT 29.6* 04/18/2015   MCV 93.4 04/18/2015   PLT 139* 04/18/2015   CMP     Component Value Date/Time   NA 140 01/16/2015 0718   K 5.3* 01/16/2015 0718   CL 98* 01/16/2015 0718   CO2 38* 01/16/2015 0718   GLUCOSE 78 01/16/2015 0718   BUN 45* 02/06/2015 0700   CREATININE 1.28*  02/06/2015 0700   CALCIUM 8.7* 01/16/2015 0718   PROT 6.2* 01/16/2015 0718   ALBUMIN 2.8* 01/16/2015 0718   AST 22 01/16/2015 0718   ALT 12* 01/16/2015 0718   ALKPHOS 61 01/16/2015 0718   BILITOT 0.3 01/16/2015 0718   GFRNONAA 36* 02/06/2015 0700   GFRAA 41* 02/06/2015 0700     RADIOGRAPHIC STUDIES: I have personally reviewed the radiological images as listed and agreed with the findings in the report. CLINICAL DATA: Cough, congestion  EXAM: CHEST 1 VIEW  COMPARISON: Chest x-ray of 03/24/2015  FINDINGS: The current film was somewhat rotated. However the vague opacity in the right mid lung and right lung base noted on the prior chest x-ray is not definitely  seen and may have represented pneumonia which has cleared. The lungs are not well aerated. The heart is enlarged and stable. A large hiatal hernia again is noted. The bones are osteopenic.  IMPRESSION: 1. No definite pneumonia or effusion. 2. Very rotated film. 3. Stable cardiomegaly and large hiatal hernia.   Electronically Signed  By: Ivar Drape M.D.  On: 04/18/2015 15:24   ASSESSMENT & PLAN:  Severe anemia  Aspiration pneumonia Infected right heel ulcer/MRSA Chronic hypoxic respiratory failure on O2 Severe osteoporosis Seizures   I discussed with the family that her anemia is most likely multifactorial including from chronic infection, CKD and certainly bone marrow pathology is possible but cannot be ruled out without a biopsy.  I certainly agree with them in regards to not pursuing a BMBX given her poor health.  I ordered blood work today and will call them with the results.  Orders Placed This Encounter  Procedures  . CBC with Differential  . Comprehensive metabolic panel  . Lactate dehydrogenase  . Sedimentation rate  . Reticulocytes  . Immunofixation electrophoresis  . Protein electrophoresis, serum  . IgG, IgA, IgM  . Beta 2 microglobuline, serum  . Folate  . Ferritin  . Vitamin  B12  . Erythropoietin  . Haptoglobin  . Sample to Blood Bank    Standing Status: Future     Number of Occurrences: 1     Standing Expiration Date: 04/26/2016   I advised them we can try to see if there are "simple" things we can address to improve her counts. In addition we discussed trying ESA support. They would think on this as well.   We will schedule her for follow-up based on her labs today.  All questions were answered. The patient knows to call the clinic with any problems, questions or concerns.  This document serves as a record of services personally performed by Ancil Linsey, MD. It was created on her behalf by Arlyce Harman, a trained medical scribe. The creation of this record is based on the scribe's personal observations and the provider's statements to them. This document has been checked and approved by the attending provider.  I have reviewed the above documentation for accuracy and completeness, and I agree with the above.  This note was electronically signed.  Molli Hazard, MD  04/27/2015 4:03 PM

## 2015-04-27 NOTE — Patient Instructions (Signed)
[facility] Discharge Instructions  RECOMMENDATIONS MADE BY THE CONSULTANT AND ANY TEST RESULTS WILL BE SENT TO YOUR REFERRING PHYSICIAN.   Exam completed by Dr Galen ManilaPenland today Blood work today We will call you with the blood work results and decide what we want to do from there. Please call the clinic if you have any questions or concerns  Thank you for choosing [facility] at [facility] to provide your oncology and hematology care.  To afford each patient quality time with our provider, please arrive at least 15 minutes before your scheduled appointment time.    You need to re-schedule your appointment should you arrive 10 or more minutes late.  We strive to give you quality time with our providers, and arriving late affects you and other patients whose appointments are after yours.  Also, if you no show three or more times for appointments you may be dismissed from the clinic at the providers discretion.     Again, thank you for choosing [facility].  Our hope is that these requests will decrease the amount of time that you wait before being seen by our physicians.       _____________________________________________________________  Should you have questions after your visit to [facility], please contact our office at [phone] between the hours of 8:30 a.m. and 4:30 p.m.  Voicemails left after 4:30 p.m. will not be returned until the following business day.  For prescription refill requests, have your pharmacy contact our office.

## 2015-04-27 NOTE — Progress Notes (Signed)
This is documentation of my visit at the skilled care facility of 04/23/2015. She is having more trouble. She has increased problems with a chronic ulceration on her right ankle. She has had pneumonia again which I am certain is from aspiration. She has been more anemic. She is still weak.  She is awake and alert. She is in a wheelchair. Her right ankle shows a 5 cm erythematous lesion with a small ulceration in the middle. This is mildly warm. Her chest shows rhonchi and wheezing bilaterally. Her heart is regular. Her HEENT shows the previous removal of her left eye.  She has an infected right heel ulcer. This was MRSA in the past and she's been treated with clindamycin. This should also treat aspiration pneumonia which she also has. She has trouble swallowing and chronic aspiration and I'm sure that's what's happened. She has a seizure disorder but no seizures recently. She has chronic hypoxic respiratory failure. This is stable on oxygen  Continue current treatments. Continue with antibiotics.

## 2015-04-27 NOTE — Progress Notes (Signed)
[name]'s reason for visit today is for labs as scheduled per MD orders.  Venipuncture performed with a 23 gauge butterfly needle to R Antecubital.  [name] tolerated procedure well and without incident; questions were answered and patient was discharged.

## 2015-04-28 ENCOUNTER — Encounter (HOSPITAL_COMMUNITY)
Admission: RE | Admit: 2015-04-28 | Discharge: 2015-04-28 | Disposition: A | Discharge status: Home / Self Care | Payer: Medicare Other | Source: Skilled Nursing Facility | Attending: Pulmonary Disease | Admitting: Pulmonary Disease

## 2015-04-28 DIAGNOSIS — Z79899 Other long term (current) drug therapy: Secondary | ICD-10-CM | POA: Diagnosis present

## 2015-04-28 LAB — IGG, IGA, IGM
IgA: 643 mg/dL — ABNORMAL HIGH (ref 64–422)
IgG (Immunoglobin G), Serum: 1507 mg/dL (ref 700–1600)
IgM, Serum: 79 mg/dL (ref 26–217)

## 2015-04-28 LAB — PROTEIN ELECTROPHORESIS, SERUM
A/G Ratio: 0.7 (ref 0.7–1.7)
ALPHA-2-GLOBULIN: 0.8 g/dL (ref 0.4–1.0)
Albumin ELP: 2.8 g/dL — ABNORMAL LOW (ref 2.9–4.4)
Alpha-1-Globulin: 0.3 g/dL (ref 0.0–0.4)
BETA GLOBULIN: 1.2 g/dL (ref 0.7–1.3)
GAMMA GLOBULIN: 1.7 g/dL (ref 0.4–1.8)
Globulin, Total: 4.1 g/dL — ABNORMAL HIGH (ref 2.2–3.9)
Total Protein ELP: 6.9 g/dL (ref 6.0–8.5)

## 2015-04-28 LAB — TSH: TSH: 1.395 u[IU]/mL (ref 0.350–4.500)

## 2015-04-28 LAB — HAPTOGLOBIN: HAPTOGLOBIN: 152 mg/dL (ref 34–200)

## 2015-04-28 LAB — ERYTHROPOIETIN: ERYTHROPOIETIN: 9.5 m[IU]/mL (ref 2.6–18.5)

## 2015-04-28 LAB — FOLATE: Folate: >80 ng/mL (ref >5.9)

## 2015-04-28 LAB — BETA 2 MICROGLOBULIN, SERUM: BETA 2 MICROGLOBULIN: 7.1 mg/L — AB (ref 0.6–2.4)

## 2015-05-01 LAB — IMMUNOFIXATION ELECTROPHORESIS
IGA: 665 mg/dL — AB (ref 64–422)
IgG (Immunoglobin G), Serum: 1423 mg/dL (ref 700–1600)
IgM, Serum: 86 mg/dL (ref 26–217)
Total Protein ELP: 6.9 g/dL (ref 6.0–8.5)

## 2015-05-04 ENCOUNTER — Encounter (HOSPITAL_COMMUNITY)
Admission: AD | Admit: 2015-05-04 | Discharge: 2015-05-04 | Disposition: A | Discharge status: Home / Self Care | Payer: Medicare Other | Source: Skilled Nursing Facility | Attending: Pulmonary Disease | Admitting: Pulmonary Disease

## 2015-05-04 LAB — CBC WITH DIFFERENTIAL/PLATELET
BASOS ABS: 0 10*3/uL (ref 0.0–0.1)
BASOS PCT: 0 %
Eosinophils Absolute: 0.3 10*3/uL (ref 0.0–0.7)
Eosinophils Relative: 6 %
HEMATOCRIT: 27 % — AB (ref 36.0–46.0)
HEMOGLOBIN: 7.8 g/dL — AB (ref 12.0–15.0)
Lymphocytes Relative: 19 %
Lymphs Abs: 1 10*3/uL (ref 0.7–4.0)
MCH: 27.7 pg (ref 26.0–34.0)
MCHC: 28.9 g/dL — ABNORMAL LOW (ref 30.0–36.0)
MCV: 95.7 fL (ref 78.0–100.0)
Monocytes Absolute: 0.3 10*3/uL (ref 0.1–1.0)
Monocytes Relative: 5 %
NEUTROS ABS: 3.6 10*3/uL (ref 1.7–7.7)
NEUTROS PCT: 70 %
Platelets: 157 10*3/uL (ref 150–400)
RBC: 2.82 MIL/uL — ABNORMAL LOW (ref 3.87–5.11)
RDW: 16.2 % — AB (ref 11.5–15.5)
WBC: 5.1 10*3/uL (ref 4.0–10.5)

## 2015-05-07 ENCOUNTER — Encounter (HOSPITAL_COMMUNITY): Payer: Self-pay | Admitting: *Deleted

## 2015-05-07 ENCOUNTER — Inpatient Hospital Stay (HOSPITAL_COMMUNITY)
Admission: EM | Admit: 2015-05-07 | Discharge: 2015-05-11 | DRG: 177 | Disposition: A | Discharge status: Skilled Nursing Facility | Payer: Medicare Other | Attending: Pulmonary Disease | Admitting: Pulmonary Disease

## 2015-05-07 ENCOUNTER — Inpatient Hospital Stay (HOSPITAL_COMMUNITY)
Admit: 2015-05-07 | Discharge: 2015-05-07 | Disposition: A | Discharge status: Home / Self Care | Payer: Medicare Other | Attending: Pulmonary Disease | Admitting: Pulmonary Disease

## 2015-05-07 ENCOUNTER — Other Ambulatory Visit: Payer: Self-pay

## 2015-05-07 ENCOUNTER — Emergency Department (HOSPITAL_COMMUNITY): Payer: Medicare Other

## 2015-05-07 DIAGNOSIS — Z888 Allergy status to other drugs, medicaments and biological substances status: Secondary | ICD-10-CM | POA: Diagnosis not present

## 2015-05-07 DIAGNOSIS — I129 Hypertensive chronic kidney disease with stage 1 through stage 4 chronic kidney disease, or unspecified chronic kidney disease: Secondary | ICD-10-CM | POA: Diagnosis present

## 2015-05-07 DIAGNOSIS — R778 Other specified abnormalities of plasma proteins: Secondary | ICD-10-CM | POA: Diagnosis present

## 2015-05-07 DIAGNOSIS — J9621 Acute and chronic respiratory failure with hypoxia: Secondary | ICD-10-CM

## 2015-05-07 DIAGNOSIS — Z881 Allergy status to other antibiotic agents status: Secondary | ICD-10-CM

## 2015-05-07 DIAGNOSIS — E875 Hyperkalemia: Secondary | ICD-10-CM | POA: Diagnosis present

## 2015-05-07 DIAGNOSIS — M810 Age-related osteoporosis without current pathological fracture: Secondary | ICD-10-CM | POA: Diagnosis present

## 2015-05-07 DIAGNOSIS — K219 Gastro-esophageal reflux disease without esophagitis: Secondary | ICD-10-CM | POA: Diagnosis present

## 2015-05-07 DIAGNOSIS — J189 Pneumonia, unspecified organism: Secondary | ICD-10-CM

## 2015-05-07 DIAGNOSIS — I248 Other forms of acute ischemic heart disease: Secondary | ICD-10-CM | POA: Diagnosis present

## 2015-05-07 DIAGNOSIS — G40909 Epilepsy, unspecified, not intractable, without status epilepticus: Secondary | ICD-10-CM

## 2015-05-07 DIAGNOSIS — J690 Pneumonitis due to inhalation of food and vomit: Secondary | ICD-10-CM | POA: Diagnosis present

## 2015-05-07 DIAGNOSIS — D649 Anemia, unspecified: Secondary | ICD-10-CM | POA: Diagnosis present

## 2015-05-07 DIAGNOSIS — E860 Dehydration: Secondary | ICD-10-CM | POA: Diagnosis present

## 2015-05-07 DIAGNOSIS — N179 Acute kidney failure, unspecified: Secondary | ICD-10-CM | POA: Diagnosis present

## 2015-05-07 DIAGNOSIS — R7989 Other specified abnormal findings of blood chemistry: Secondary | ICD-10-CM | POA: Diagnosis not present

## 2015-05-07 DIAGNOSIS — E43 Unspecified severe protein-calorie malnutrition: Secondary | ICD-10-CM

## 2015-05-07 DIAGNOSIS — Y95 Nosocomial condition: Secondary | ICD-10-CM | POA: Diagnosis present

## 2015-05-07 DIAGNOSIS — Z8522 Personal history of malignant neoplasm of nasal cavities, middle ear, and accessory sinuses: Secondary | ICD-10-CM | POA: Diagnosis not present

## 2015-05-07 DIAGNOSIS — N289 Disorder of kidney and ureter, unspecified: Secondary | ICD-10-CM

## 2015-05-07 DIAGNOSIS — I2510 Atherosclerotic heart disease of native coronary artery without angina pectoris: Secondary | ICD-10-CM | POA: Diagnosis present

## 2015-05-07 DIAGNOSIS — I10 Essential (primary) hypertension: Secondary | ICD-10-CM | POA: Diagnosis not present

## 2015-05-07 DIAGNOSIS — K449 Diaphragmatic hernia without obstruction or gangrene: Secondary | ICD-10-CM | POA: Diagnosis present

## 2015-05-07 DIAGNOSIS — N189 Chronic kidney disease, unspecified: Secondary | ICD-10-CM | POA: Diagnosis present

## 2015-05-07 DIAGNOSIS — Z6820 Body mass index (BMI) 20.0-20.9, adult: Secondary | ICD-10-CM | POA: Diagnosis not present

## 2015-05-07 DIAGNOSIS — Z886 Allergy status to analgesic agent status: Secondary | ICD-10-CM

## 2015-05-07 DIAGNOSIS — R1310 Dysphagia, unspecified: Secondary | ICD-10-CM | POA: Diagnosis present

## 2015-05-07 DIAGNOSIS — A419 Sepsis, unspecified organism: Secondary | ICD-10-CM | POA: Diagnosis present

## 2015-05-07 DIAGNOSIS — L8990 Pressure ulcer of unspecified site, unspecified stage: Secondary | ICD-10-CM | POA: Diagnosis present

## 2015-05-07 DIAGNOSIS — Z880 Allergy status to penicillin: Secondary | ICD-10-CM

## 2015-05-07 DIAGNOSIS — Z7982 Long term (current) use of aspirin: Secondary | ICD-10-CM

## 2015-05-07 DIAGNOSIS — Z66 Do not resuscitate: Secondary | ICD-10-CM

## 2015-05-07 DIAGNOSIS — F0390 Unspecified dementia without behavioral disturbance: Secondary | ICD-10-CM | POA: Diagnosis present

## 2015-05-07 DIAGNOSIS — E039 Hypothyroidism, unspecified: Secondary | ICD-10-CM | POA: Diagnosis present

## 2015-05-07 DIAGNOSIS — L8992 Pressure ulcer of unspecified site, stage 2: Secondary | ICD-10-CM

## 2015-05-07 DIAGNOSIS — L89152 Pressure ulcer of sacral region, stage 2: Secondary | ICD-10-CM | POA: Diagnosis present

## 2015-05-07 HISTORY — DX: Hypothyroidism, unspecified: E03.9

## 2015-05-07 HISTORY — DX: Essential (primary) hypertension: I10

## 2015-05-07 HISTORY — DX: Do not resuscitate: Z66

## 2015-05-07 LAB — BASIC METABOLIC PANEL
ANION GAP: 7 (ref 5–15)
BUN: 50 mg/dL — ABNORMAL HIGH (ref 6–20)
CHLORIDE: 92 mmol/L — AB (ref 101–111)
CO2: 39 mmol/L — AB (ref 22–32)
Calcium: 9.8 mg/dL (ref 8.9–10.3)
Creatinine, Ser: 1.97 mg/dL — ABNORMAL HIGH (ref 0.44–1.00)
GFR calc non Af Amer: 21 mL/min — ABNORMAL LOW (ref >60)
GFR, EST AFRICAN AMERICAN: 25 mL/min — AB (ref >60)
Glucose, Bld: 198 mg/dL — ABNORMAL HIGH (ref 65–99)
POTASSIUM: 5.4 mmol/L — AB (ref 3.5–5.1)
SODIUM: 138 mmol/L (ref 135–145)

## 2015-05-07 LAB — TROPONIN I: Troponin I: 0.4 ng/mL — ABNORMAL HIGH (ref <0.031)

## 2015-05-07 LAB — URINALYSIS, ROUTINE W REFLEX MICROSCOPIC
Bilirubin Urine: NEGATIVE
Glucose, UA: NEGATIVE mg/dL
Hgb urine dipstick: NEGATIVE
Ketones, ur: NEGATIVE mg/dL
LEUKOCYTES UA: NEGATIVE
NITRITE: NEGATIVE
PH: 6 (ref 5.0–8.0)
Protein, ur: 30 mg/dL — AB
SPECIFIC GRAVITY, URINE: 1.015 (ref 1.005–1.030)

## 2015-05-07 LAB — CBC WITH DIFFERENTIAL/PLATELET
BASOS ABS: 0 10*3/uL (ref 0.0–0.1)
BASOS PCT: 0 %
EOS PCT: 0 %
Eosinophils Absolute: 0 10*3/uL (ref 0.0–0.7)
HEMATOCRIT: 29.2 % — AB (ref 36.0–46.0)
Hemoglobin: 8.7 g/dL — ABNORMAL LOW (ref 12.0–15.0)
LYMPHS PCT: 2 %
Lymphs Abs: 0.3 10*3/uL — ABNORMAL LOW (ref 0.7–4.0)
MCH: 28.4 pg (ref 26.0–34.0)
MCHC: 29.8 g/dL — AB (ref 30.0–36.0)
MCV: 95.4 fL (ref 78.0–100.0)
MONO ABS: 0.7 10*3/uL (ref 0.1–1.0)
MONOS PCT: 4 %
NEUTROS ABS: 16.8 10*3/uL — AB (ref 1.7–7.7)
Neutrophils Relative %: 94 %
PLATELETS: 179 10*3/uL (ref 150–400)
RBC: 3.06 MIL/uL — ABNORMAL LOW (ref 3.87–5.11)
RDW: 16.4 % — AB (ref 11.5–15.5)
WBC: 17.9 10*3/uL — ABNORMAL HIGH (ref 4.0–10.5)

## 2015-05-07 LAB — URINE MICROSCOPIC-ADD ON
Bacteria, UA: NONE SEEN
SQUAMOUS EPITHELIAL / LPF: NONE SEEN
WBC UA: NONE SEEN WBC/hpf (ref 0–5)

## 2015-05-07 LAB — LACTIC ACID, PLASMA: Lactic Acid, Venous: 1.3 mmol/L (ref 0.5–2.0)

## 2015-05-07 MED ORDER — AZTREONAM 2 G IJ SOLR
2.0000 g | Freq: Once | INTRAMUSCULAR | Status: AC
  Administered 2015-05-07: 2 g via INTRAVENOUS
  Filled 2015-05-07: qty 2

## 2015-05-07 MED ORDER — VANCOMYCIN HCL 500 MG IV SOLR
500.0000 mg | Freq: Once | INTRAVENOUS | Status: AC
  Administered 2015-05-08: 500 mg via INTRAVENOUS
  Filled 2015-05-07: qty 500

## 2015-05-07 MED ORDER — VANCOMYCIN HCL 500 MG IV SOLR
INTRAVENOUS | Status: AC
  Filled 2015-05-07: qty 500

## 2015-05-07 MED ORDER — SODIUM CHLORIDE 0.9 % IV BOLUS (SEPSIS)
250.0000 mL | Freq: Once | INTRAVENOUS | Status: AC
  Administered 2015-05-07: 250 mL via INTRAVENOUS

## 2015-05-07 MED ORDER — ACETAMINOPHEN 650 MG RE SUPP
650.0000 mg | Freq: Once | RECTAL | Status: AC
  Administered 2015-05-07: 650 mg via RECTAL
  Filled 2015-05-07: qty 1

## 2015-05-07 MED ORDER — NALOXONE HCL 0.4 MG/ML IJ SOLN
INTRAMUSCULAR | Status: AC
  Administered 2015-05-07: 0.4 mg via INTRAVENOUS
  Filled 2015-05-07: qty 1

## 2015-05-07 MED ORDER — NALOXONE HCL 0.4 MG/ML IJ SOLN
0.4000 mg | Freq: Once | INTRAMUSCULAR | Status: AC
  Administered 2015-05-07: 0.4 mg via INTRAVENOUS

## 2015-05-07 MED ORDER — SODIUM CHLORIDE 0.9 % IV SOLN
INTRAVENOUS | Status: DC
Start: 2015-05-07 — End: 2015-05-11
  Administered 2015-05-07 – 2015-05-11 (×3): via INTRAVENOUS

## 2015-05-07 NOTE — ED Notes (Signed)
Pt has bandage to left eye, right pupil pinpoint,

## 2015-05-07 NOTE — ED Notes (Signed)
[facility] sob, pt obvisous resp distress noted upon pt's arrival to er, pt is dnr

## 2015-05-07 NOTE — ED Notes (Signed)
Lab called advised that they were not sure about the accuracy of pt's cbc results, was advised to recollect blood for new test, Dr Clarene DukeMcmanus notified,

## 2015-05-07 NOTE — ED Notes (Signed)
Pt given Narcan and pt's respiration rate went from 12 to 40. Pt became more aroused and began trying to remove her non rebreather.

## 2015-05-07 NOTE — ED Provider Notes (Signed)
CSN: [id number]     Arrival date & time 05/07/15  2103 History   First MD Initiated Contact with Patient 05/07/15 2109     Chief Complaint  Patient presents with  . Respiratory Distress      The history is provided by the EMS personnel and the nursing home. The history is limited by the condition of the patient (Hx dementia).    Pt was seen at 2100.  Per EMS, NH report and family: Pt with onset of "respiratory distress" earlier today.  NH did not report any other symptoms. Family states NH told them: "they thought it was aspiration pneumonia because she's been falling asleep while eating."  Pt has hx of dementia and is DNR.   Past Medical History  Diagnosis Date  . Seizures (HCC)   . Aspiration pneumonia (HCC)   . Dysphagia   . Osteoporosis   . Thyroid disease   . Depression   . MRSA (methicillin resistant staph aureus) culture positive   . Respiratory failure (HCC)   . Hypokalemia   . Reflux   . Vertebral compression fracture (HCC)     thoracic and lumbar  . History of sinus cancer 1970's    left side  . Chronic pain   . Hypertension   . GERD (gastroesophageal reflux disease)   . DNR (do not resuscitate)    Past Surgical History  Procedure Laterality Date  . Kyphoplasty      Lumbar and thoracic  . Palate surgery  1970's    removal of hard and soft palate on left side  . Enucleation Left 1970's    left eye  . Cholecystectomy    . Appendectomy    . Cesarean section     Family History  Problem Relation Age of Onset  . Cancer Father    Social History  Substance Use Topics  . Smoking status: Never Smoker   . Smokeless tobacco: None  . Alcohol Use: No    Review of Systems  Unable to perform ROS: Dementia      Allergies  Aricept; Bactrim; Ciprofloxacin; Darvocet; Namenda; Penicillins; and Vibramycin  Home Medications   Prior to Admission medications   Medication Sig Start Date End Date Taking? Authorizing Provider  Amino Acids-Protein Hydrolys (FEEDING  SUPPLEMENT, PRO-STAT SUGAR FREE 64,) LIQD Take 30 mLs by mouth 2 (two) times daily.   Yes Historical Provider, MD  calcium-vitamin D (OSCAL WITH D) 500-200 MG-UNIT per tablet Take 1 tablet by mouth 3 (three) times daily. For osteoporosis   Yes Historical Provider, MD  Cranberry-Vitamin C-Probiotic (AZO CRANBERRY PO) Take 1 tablet by mouth 2 (two) times daily.   Yes Historical Provider, MD  docusate sodium (COLACE) 100 MG capsule Take 100 mg by mouth 2 (two) times daily. For constipation   Yes Historical Provider, MD  escitalopram (LEXAPRO) 10 MG tablet Take 10 mg by mouth daily. For depression   Yes Historical Provider, MD  fentaNYL (DURAGESIC - DOSED MCG/HR) 50 MCG/HR Place 50 mcg onto the skin every other day.   Yes Historical Provider, MD  furosemide (LASIX) 20 MG tablet Take 20 mg by mouth daily. For HTN/ edema   Yes Historical Provider, MD  HYDROcodone-acetaminophen (NORCO/VICODIN) 5-325 MG per tablet Take one tablet by mouth three times daily and every 6 hours as needed for pain Patient taking differently: Take 0.5 tablets by mouth 2 (two) times daily.  01/26/14  Yes Mahima Pandey, MD  ipratropium-albuterol (DUONEB) 0.5-2.5 (3) MG/3ML SOLN Take 3 mLs by  nebulization every 6 (six) hours as needed (Shortness Of Breath).   Yes Historical Provider, MD  iron polysaccharides (NIFEREX) 150 MG capsule Take 150 mg by mouth daily.   Yes Historical Provider, MD  levETIRAcetam (KEPPRA) 250 MG tablet Take 250 mg by mouth every 12 (twelve) hours. For seizures   Yes Historical Provider, MD  levothyroxine (SYNTHROID, LEVOTHROID) 175 MCG tablet Take 175 mcg by mouth daily. For thyroid therapy   Yes Historical Provider, MD  loratadine (CLARITIN) 10 MG tablet Take 10 mg by mouth daily. For allergies   Yes Historical Provider, MD  metoprolol tartrate (LOPRESSOR) 25 MG tablet Take 25 mg by mouth daily. For HTN   Yes Historical Provider, MD  Multiple Vitamin (MULTIVITAMIN WITH MINERALS) TABS Take 1 tablet by mouth  daily. For nutritional supplement   Yes Historical Provider, MD  ranitidine (ZANTAC) 150 MG tablet Take 150 mg by mouth at bedtime.   Yes Historical Provider, MD  rOPINIRole (REQUIP) 4 MG tablet Take 4 mg by mouth at bedtime. For parkinson disease   Yes Historical Provider, MD  sennosides-docusate sodium (SENOKOT-S) 8.6-50 MG tablet Take 1 tablet by mouth 2 (two) times daily. For constipation   Yes Historical Provider, MD  alum & mag hydroxide-simeth (MYLANTA) 200-200-20 MG/5ML suspension Take 30 mLs by mouth every 4 (four) hours as needed for indigestion or heartburn.    Historical Provider, MD  clindamycin (CLEOCIN) 300 MG capsule Take 1 capsule (300 mg total) by mouth 3 (three) times daily. 09/13/14   Kari BaarsEdward Hawkins, MD  hydroxypropyl methylcellulose / hypromellose (ISOPTO TEARS / GONIOVISC) 2.5 % ophthalmic solution Place 1 drop into the right eye as needed for dry eyes.    Historical Provider, MD  magnesium hydroxide (MILK OF MAGNESIA) 400 MG/5ML suspension Take 30 mLs by mouth daily as needed for mild constipation.    Historical Provider, MD  phenylephrine-shark liver oil-mineral oil-petrolatum (PREPARATION H) 0.25-3-14-71.9 % rectal ointment Place 1 application rectally as needed for hemorrhoids.    Historical Provider, MD   BP 104/49 mmHg  Pulse 82  Temp(Src) 100.8 F (38.2 C) (Oral)  Resp 21  Ht 4\' 11"  (1.499 m)  Wt 95 lb (43.092 kg)  BMI 19.18 kg/m2  SpO2 98%   Patient Vitals for the past 24 hrs:  BP Temp Temp src Pulse Resp SpO2 Height Weight  05/07/15 2303 - - - - - - 4\' 11"  (1.499 m) 95 lb (43.092 kg)  05/07/15 2300 (!) 104/49 mmHg 100.8 F (38.2 C) - 82 21 98 % - -  05/07/15 2230 (!) 115/54 mmHg 100.6 F (38.1 C) - 93 25 96 % - -  05/07/15 2215 - 100.4 F (38 C) - 106 (!) 29 97 % - -  05/07/15 2200 127/99 mmHg 100.2 F (37.9 C) - 111 19 94 % - -  05/07/15 2145 - 99.5 F (37.5 C) - 114 24 98 % - -  05/07/15 2139 - 99.7 F (37.6 C) Oral - - - - -  05/07/15 2130 161/86  mmHg - - 118 (!) 31 96 % - -  05/07/15 2116 - - - - - 100 % - -  05/07/15 2108 127/66 mmHg - - 99 12 100 % - -      Physical Exam  2105: Physical examination:  Nursing notes reviewed; Vital signs and O2 SAT reviewed;  Constitutional: Thin, frail. In no acute distress; Head:  Normocephalic, atraumatic; Eyes: Right pupil pinpoint. Bandage over left eye. No scleral icterus; ENMT: Mouth and  pharynx normal, Mucous membranes dry; Neck: Supple, Full range of motion, No lymphadenopathy; Cardiovascular: Regular rate and rhythm, No gallop; Respiratory: Breath sounds coarse & equal bilaterally, No wheezes. Shallow/slow resps.; Chest: Nontender, Movement normal; Abdomen: Soft, Nontender, Nondistended, Normal bowel sounds; Genitourinary: No CVA tenderness; Extremities: Pulses normal, No deformity. No edema, No calf edema or asymmetry.; Neuro: Laying curled up on stretcher, eyes open, some movement of extremities on stretcher.; Skin: Color normal, Warm, Dry.   ED Course  Procedures (including critical care time)   Labs Review Imaging Review  I have personally reviewed and evaluated these images and lab results as part of my medical decision-making.   EKG Interpretation   Date/Time:  Sunday May 07 2015 21:07:32 EST Ventricular Rate:  98 PR Interval:  139 QRS Duration: 98 QT Interval:  366 QTC Calculation: 467 R Axis:   16 Text Interpretation:  Sinus rhythm RSR' in V1 or V2, probably normal  variant Abnormal T, consider ischemia, diffuse leads Artifact When  compared with ECG of 09/10/2014 Diffuse T wave abnormality is now Present  Confirmed by [facility]  MD, Nicholos Johns [phone]) on 05/07/2015 10:15:04 PM      MDM  MDM Reviewed: previous chart, nursing note and vitals Reviewed previous: labs and ECG Interpretation: labs, ECG and x-ray Total time providing critical care: 30-74 minutes. This excludes time spent performing separately reportable procedures and services. Consults: admitting  MD     CRITICAL CARE Performed by: Laray Anger Total critical care time: 35 minutes Critical care time was exclusive of separately billable procedures and treating other patients. Critical care was necessary to treat or prevent imminent or life-threatening deterioration. Critical care was time spent personally by me on the following activities: development of treatment plan with patient and/or surrogate as well as nursing, discussions with consultants, evaluation of patient's response to treatment, examination of patient, obtaining history from patient or surrogate, ordering and performing treatments and interventions, ordering and review of laboratory studies, ordering and review of radiographic studies, pulse oximetry and re-evaluation of patient's condition.  Results for orders placed or performed during the hospital encounter of 05/07/15  Troponin I  Result Value Ref Range   Troponin I 0.40 (H) <0.031 ng/mL  Lactic acid, plasma  Result Value Ref Range   Lactic Acid, Venous 1.3 0.5 - 2.0 mmol/L  Basic metabolic panel  Result Value Ref Range   Sodium 138 135 - 145 mmol/L   Potassium 5.4 (H) 3.5 - 5.1 mmol/L   Chloride 92 (L) 101 - 111 mmol/L   CO2 39 (H) 22 - 32 mmol/L   Glucose, Bld 198 (H) 65 - 99 mg/dL   BUN 50 (H) 6 - 20 mg/dL   Creatinine, Ser 6.04 (H) 0.44 - 1.00 mg/dL   Calcium 9.8 8.9 - 54.0 mg/dL   GFR calc non Af Amer 21 (L) >60 mL/min   GFR calc Af Amer 25 (L) >60 mL/min   Anion gap 7 5 - 15  Urinalysis, Routine w reflex microscopic  Result Value Ref Range   Color, Urine YELLOW YELLOW   APPearance CLEAR CLEAR   Specific Gravity, Urine 1.015 1.005 - 1.030   pH 6.0 5.0 - 8.0   Glucose, UA NEGATIVE NEGATIVE mg/dL   Hgb urine dipstick NEGATIVE NEGATIVE   Bilirubin Urine NEGATIVE NEGATIVE   Ketones, ur NEGATIVE NEGATIVE mg/dL   Protein, ur 30 (A) NEGATIVE mg/dL   Nitrite NEGATIVE NEGATIVE   Leukocytes, UA NEGATIVE NEGATIVE  Urine microscopic-add on  Result  Value Ref  Range   Squamous Epithelial / LPF NONE SEEN NONE SEEN   WBC, UA NONE SEEN 0 - 5 WBC/hpf   RBC / HPF 0-5 0 - 5 RBC/hpf   Bacteria, UA NONE SEEN NONE SEEN   Casts GRANULAR CAST (A) NEGATIVE   Urine-Other MUCOUS PRESENT   CBC with Differential  Result Value Ref Range   WBC 17.9 (H) 4.0 - 10.5 K/uL   RBC 3.06 (L) 3.87 - 5.11 MIL/uL   Hemoglobin 8.7 (L) 12.0 - 15.0 g/dL   HCT 16.1 (L) 09.6 - 04.5 %   MCV 95.4 78.0 - 100.0 fL   MCH 28.4 26.0 - 34.0 pg   MCHC 29.8 (L) 30.0 - 36.0 g/dL   RDW 40.9 (H) 81.1 - 91.4 %   Platelets 179 150 - 400 K/uL   Neutrophils Relative % 94 %   Neutro Abs 16.8 (H) 1.7 - 7.7 K/uL   Lymphocytes Relative 2 %   Lymphs Abs 0.3 (L) 0.7 - 4.0 K/uL   Monocytes Relative 4 %   Monocytes Absolute 0.7 0.1 - 1.0 K/uL   Eosinophils Relative 0 %   Eosinophils Absolute 0.0 0.0 - 0.7 K/uL   Basophils Relative 0 %   Basophils Absolute 0.0 0.0 - 0.1 K/uL   Dg Chest 2 View 05/07/2015  CLINICAL DATA:  Shortness of Breath, cough and congestion EXAM: CHEST - 2 VIEW COMPARISON:  04/18/2015 FINDINGS: Large hiatal hernia is again seen. The cardiac shadow is stable. Multilevel vertebral augmentation is seen. Increasing infiltrate is noted in the bases bilaterally slightly worse on the right than the left. No other focal abnormality is seen. IMPRESSION: Increasing bibasilar infiltrates. Electronically Signed   By: Alcide Clever M.D.   On: 05/07/2015 15:21   Dg Chest Port 1 View 05/07/2015  CLINICAL DATA:  I initial evaluation for acute shortness of breath. EXAM: PORTABLE CHEST 1 VIEW COMPARISON:  Prior study from 05/07/2015. FINDINGS: Patient is markedly rotated to the left. Moderate cardiomegaly is similar to previous. Mediastinal silhouette within normal limits. Prominent atheromatous plaque within the aortic arch. Large hiatal hernia again noted. Lungs are mildly hypoinflated. There are bibasilar are irregular parenchymal opacities, which may reflect atelectasis, infiltrates,  or possibly aspiration. Small right pleural effusion is suspected. Vascular congestion without frank pulmonary edema. No pneumothorax. Osseous structures unchanged. Sequelae of prior vertebral augmentation noted within the thoracolumbar spine. IMPRESSION: 1. Patchy bibasilar airspace opacities, which may reflect atelectasis or sequela of infection or aspiration pneumonitis. 2. Stable cardiomegaly with diffuse pulmonary vascular congestion without overt pulmonary edema. 3. Suspect small right pleural effusion. 4. Large hiatal hernia. Electronically Signed   By: Rise Mu M.D.   On: 05/07/2015 22:16    Results for [name], Kaiser (MRN [id number]) as of 05/07/2015 23:10  Ref. Range 02/01/2015 08:15 02/03/2015 07:30 02/06/2015 07:00 04/27/2015 16:07 05/07/2015 21:11  BUN Latest Ref Range: 6-20 mg/dL 62 (H) 45 (H) 45 (H) 52 (H) 50 (H)  Creatinine Latest Ref Range: 0.44-1.00 mg/dL 0.86 (H) 5.78 (H) 4.69 (H) 1.54 (H) 1.97 (H)   Results for [name], Kaiser (MRN [id number]) as of 05/07/2015 23:10  Ref. Range 04/18/2015 07:35 04/27/2015 16:07 05/04/2015 07:15 05/07/2015 22:44  Hemoglobin Latest Ref Range: 12.0-15.0 g/dL 8.7 (L) 8.4 (L) 7.8 (L) 8.7 (L)  HCT Latest Ref Range: 36.0-46.0 % 29.6 (L) 29.6 (L) 27.0 (L) 29.2 (L)    2300:  On arrival: fentanyl patch removed and IV narcan given with good effect. Bipap also placed. Sats and RR improving.  BP's per baseline in EPIC.  Pt has now developed fever, will dose APAP PR and obtain BC x2. IV abx ordered for HCAP. IVF continues for elevated BUN/Cr. EKG with diffuse TWA and troponin elevated; likely demand d/t resp distress. Initial CBC clotted; redraw results pending.   2340:  Family at bedside: confirm pt's DNR/DNI, and request non-invasive treatments only.  H/H per baseline. T/C to [facility]. Sharl Ma, case discussed, including:  HPI, pertinent PM/SHx, VS/PE, dx testing, ED course and treatment:  Agreeable to admit, requests to write temporary orders, obtain  stepdown bed to team APAdmits.      Samuel Jester, DO 05/09/15 2043

## 2015-05-07 NOTE — Progress Notes (Signed)
ANTIBIOTIC CONSULT NOTE-Preliminary  Pharmacy Consult for Vancomycin Indication: Pneumonia  Allergies  Allergen Reactions  . Aricept [Donepezil Hcl]   . Bactrim [Sulfamethoxazole-Trimethoprim]   . Ciprofloxacin   . Darvocet [Propoxyphene N-Acetaminophen]   . Namenda [Memantine Hcl]   . Penicillins   . Vibramycin [Doxycycline Calcium]     Patient Measurements: Height: 4\' 11"  (149.9 cm) Weight: 95 lb (43.092 kg) IBW/kg (Calculated) : 43.2  Vital Signs: Temp: 100.8 F (38.2 C) (12/18 2300) Temp Source: Oral (12/18 2139) BP: 104/49 mmHg (12/18 2300) Pulse Rate: 82 (12/18 2300)  Labs:  Recent Labs  05/07/15 2111  CREATININE 1.97*    Estimated Creatinine Clearance: 12.9 mL/min (by C-G formula based on Cr of 1.97).  No results for input(s): VANCOTROUGH, VANCOPEAK, VANCORANDOM, GENTTROUGH, GENTPEAK, GENTRANDOM, TOBRATROUGH, TOBRAPEAK, TOBRARND, AMIKACINPEAK, AMIKACINTROU, AMIKACIN in the last 72 hours.   Microbiology: No results found for this or any previous visit (from the past 720 hour(s)).  Medical History: Past Medical History  Diagnosis Date  . Seizures (HCC)   . Aspiration pneumonia (HCC)   . Dysphagia   . Osteoporosis   . Thyroid disease   . Depression   . MRSA (methicillin resistant staph aureus) culture positive   . Respiratory failure (HCC)   . Hypokalemia   . Reflux   . Vertebral compression fracture (HCC)     thoracic and lumbar  . History of sinus cancer 1970's    left side  . Chronic pain   . Hypertension   . GERD (gastroesophageal reflux disease)   . DNR (do not resuscitate)     Medications:  Aztreonam 2 Gm IV x 1 dose in the ED at @2300  05/07/15  Assessment: 81 yo female SNF resident seen in the ED with respiratory distress and possible aspiration pneumonia. Empiric antibiotics to be started.  Goal of Therapy:  Vancomycin troughs 15-20 mcg/ml  Plan:  Preliminary review of pertinent patient information completed.  Protocol will be  initiated with a one-time dose of Vancomycin 500 mg IV.  Jeani HawkingAnnie Penn clinical pharmacist will complete review during morning rounds to assess patient and finalize treatment regimen.  Arelia SneddonMason, Sequita Wise Anne, [facility] Greb Mc - TorranceRPH 05/07/2015,11:21 PM .

## 2015-05-07 NOTE — ED Notes (Signed)
Patient EKG done and given to EDP. Patient on cardiac monitor.

## 2015-05-07 NOTE — ED Notes (Signed)
Ems states that they were advised that pt became sob 5 minutes before they were called, ? Approximately 30 minutes ago due to extended eta of ems,

## 2015-05-08 ENCOUNTER — Encounter (HOSPITAL_COMMUNITY): Payer: Self-pay | Admitting: Cardiology

## 2015-05-08 ENCOUNTER — Inpatient Hospital Stay (HOSPITAL_COMMUNITY): Payer: Medicare Other

## 2015-05-08 ENCOUNTER — Other Ambulatory Visit: Payer: Self-pay

## 2015-05-08 DIAGNOSIS — J9621 Acute and chronic respiratory failure with hypoxia: Secondary | ICD-10-CM

## 2015-05-08 DIAGNOSIS — R778 Other specified abnormalities of plasma proteins: Secondary | ICD-10-CM | POA: Diagnosis present

## 2015-05-08 DIAGNOSIS — N179 Acute kidney failure, unspecified: Secondary | ICD-10-CM

## 2015-05-08 DIAGNOSIS — N189 Chronic kidney disease, unspecified: Secondary | ICD-10-CM

## 2015-05-08 DIAGNOSIS — L8990 Pressure ulcer of unspecified site, unspecified stage: Secondary | ICD-10-CM | POA: Diagnosis present

## 2015-05-08 DIAGNOSIS — I10 Essential (primary) hypertension: Secondary | ICD-10-CM

## 2015-05-08 DIAGNOSIS — R7989 Other specified abnormal findings of blood chemistry: Secondary | ICD-10-CM | POA: Diagnosis present

## 2015-05-08 DIAGNOSIS — J189 Pneumonia, unspecified organism: Secondary | ICD-10-CM

## 2015-05-08 DIAGNOSIS — D649 Anemia, unspecified: Secondary | ICD-10-CM

## 2015-05-08 LAB — COMPREHENSIVE METABOLIC PANEL
ALBUMIN: 2.6 g/dL — AB (ref 3.5–5.0)
ALK PHOS: 59 U/L (ref 38–126)
ALT: 10 U/L — AB (ref 14–54)
AST: 27 U/L (ref 15–41)
Anion gap: 5 (ref 5–15)
BUN: 51 mg/dL — AB (ref 6–20)
CALCIUM: 9.2 mg/dL (ref 8.9–10.3)
CHLORIDE: 99 mmol/L — AB (ref 101–111)
CO2: 35 mmol/L — AB (ref 22–32)
CREATININE: 1.81 mg/dL — AB (ref 0.44–1.00)
GFR calc non Af Amer: 23 mL/min — ABNORMAL LOW (ref >60)
GFR, EST AFRICAN AMERICAN: 27 mL/min — AB (ref >60)
GLUCOSE: 113 mg/dL — AB (ref 65–99)
Potassium: 5.5 mmol/L — ABNORMAL HIGH (ref 3.5–5.1)
SODIUM: 139 mmol/L (ref 135–145)
Total Bilirubin: 0.4 mg/dL (ref 0.3–1.2)
Total Protein: 6.5 g/dL (ref 6.5–8.1)

## 2015-05-08 LAB — TROPONIN I
TROPONIN I: 0.26 ng/mL — AB (ref <0.031)
Troponin I: 0.37 ng/mL — ABNORMAL HIGH (ref <0.031)
Troponin I: 0.16 ng/mL — ABNORMAL HIGH (ref <0.031)

## 2015-05-08 LAB — CBC
HCT: 26.2 % — ABNORMAL LOW (ref 36.0–46.0)
HEMOGLOBIN: 7.8 g/dL — AB (ref 12.0–15.0)
MCH: 28.2 pg (ref 26.0–34.0)
MCHC: 29.8 g/dL — ABNORMAL LOW (ref 30.0–36.0)
MCV: 94.6 fL (ref 78.0–100.0)
PLATELETS: 150 10*3/uL (ref 150–400)
RBC: 2.77 MIL/uL — AB (ref 3.87–5.11)
RDW: 16.3 % — ABNORMAL HIGH (ref 11.5–15.5)
WBC: 16.8 10*3/uL — AB (ref 4.0–10.5)

## 2015-05-08 LAB — LACTIC ACID, PLASMA: LACTIC ACID, VENOUS: 1 mmol/L (ref 0.5–2.0)

## 2015-05-08 MED ORDER — ROPINIROLE HCL 1 MG PO TABS
4.0000 mg | ORAL_TABLET | Freq: Every day | ORAL | Status: DC
  Administered 2015-05-09 – 2015-05-10 (×2): 4 mg via ORAL
  Filled 2015-05-08 (×5): qty 4

## 2015-05-08 MED ORDER — POLYSACCHARIDE IRON COMPLEX 150 MG PO CAPS
150.0000 mg | ORAL_CAPSULE | Freq: Every day | ORAL | Status: DC
  Administered 2015-05-08 – 2015-05-11 (×4): 150 mg via ORAL
  Filled 2015-05-08 (×4): qty 1

## 2015-05-08 MED ORDER — VANCOMYCIN HCL 500 MG IV SOLR
500.0000 mg | INTRAVENOUS | Status: DC
  Administered 2015-05-10: 500 mg via INTRAVENOUS
  Filled 2015-05-08 (×2): qty 500

## 2015-05-08 MED ORDER — HYPROMELLOSE (GONIOSCOPIC) 2.5 % OP SOLN
1.0000 [drp] | OPHTHALMIC | Status: DC | PRN
  Filled 2015-05-08: qty 15

## 2015-05-08 MED ORDER — PRO-STAT SUGAR FREE PO LIQD
30.0000 mL | Freq: Two times a day (BID) | ORAL | Status: DC
  Administered 2015-05-09 – 2015-05-10 (×4): 30 mL via ORAL
  Filled 2015-05-08 (×6): qty 30

## 2015-05-08 MED ORDER — IPRATROPIUM-ALBUTEROL 0.5-2.5 (3) MG/3ML IN SOLN
3.0000 mL | Freq: Four times a day (QID) | RESPIRATORY_TRACT | Status: DC
  Administered 2015-05-08 – 2015-05-11 (×13): 3 mL via RESPIRATORY_TRACT
  Filled 2015-05-08 (×13): qty 3

## 2015-05-08 MED ORDER — ONDANSETRON HCL 4 MG PO TABS: 4.0000 mg | ORAL_TABLET | Freq: Four times a day (QID) | ORAL | Status: DC | PRN

## 2015-05-08 MED ORDER — LORATADINE 10 MG PO TABS
10.0000 mg | ORAL_TABLET | Freq: Every day | ORAL | Status: DC
  Administered 2015-05-08 – 2015-05-11 (×4): 10 mg via ORAL
  Filled 2015-05-08 (×4): qty 1

## 2015-05-08 MED ORDER — METOPROLOL TARTRATE 1 MG/ML IV SOLN
5.0000 mg | INTRAVENOUS | Status: DC | PRN
  Administered 2015-05-08 – 2015-05-09 (×2): 5 mg via INTRAVENOUS
  Filled 2015-05-08 (×2): qty 5

## 2015-05-08 MED ORDER — ESCITALOPRAM OXALATE 10 MG PO TABS
10.0000 mg | ORAL_TABLET | Freq: Every day | ORAL | Status: DC
  Administered 2015-05-08 – 2015-05-11 (×4): 10 mg via ORAL
  Filled 2015-05-08 (×4): qty 1

## 2015-05-08 MED ORDER — ACETAMINOPHEN 650 MG RE SUPP
650.0000 mg | RECTAL | Status: DC | PRN
Start: 2015-05-08 — End: 2015-05-11
  Administered 2015-05-08 – 2015-05-09 (×2): 650 mg via RECTAL
  Filled 2015-05-08 (×2): qty 1

## 2015-05-08 MED ORDER — ALBUTEROL SULFATE (2.5 MG/3ML) 0.083% IN NEBU: 2.5000 mg | INHALATION_SOLUTION | RESPIRATORY_TRACT | Status: DC | PRN

## 2015-05-08 MED ORDER — DOCUSATE SODIUM 100 MG PO CAPS
100.0000 mg | ORAL_CAPSULE | Freq: Two times a day (BID) | ORAL | Status: DC
  Administered 2015-05-08 – 2015-05-11 (×5): 100 mg via ORAL
  Filled 2015-05-08 (×5): qty 1

## 2015-05-08 MED ORDER — LEVOTHYROXINE SODIUM 75 MCG PO TABS
175.0000 ug | ORAL_TABLET | Freq: Every day | ORAL | Status: DC
  Administered 2015-05-08 – 2015-05-11 (×4): 175 ug via ORAL
  Filled 2015-05-08 (×4): qty 1

## 2015-05-08 MED ORDER — ASPIRIN EC 81 MG PO TBEC
81.0000 mg | DELAYED_RELEASE_TABLET | Freq: Every day | ORAL | Status: DC
  Administered 2015-05-08 – 2015-05-11 (×4): 81 mg via ORAL
  Filled 2015-05-08 (×4): qty 1

## 2015-05-08 MED ORDER — POLYVINYL ALCOHOL 1.4 % OP SOLN
1.0000 [drp] | OPHTHALMIC | Status: DC | PRN
  Administered 2015-05-10: 1 [drp] via OPHTHALMIC
  Filled 2015-05-08: qty 15

## 2015-05-08 MED ORDER — ENOXAPARIN SODIUM 30 MG/0.3ML ~~LOC~~ SOLN
20.0000 mg | SUBCUTANEOUS | Status: DC
  Administered 2015-05-08 – 2015-05-09 (×2): 20 mg via SUBCUTANEOUS
  Filled 2015-05-08 (×2): qty 0.3

## 2015-05-08 MED ORDER — ENOXAPARIN SODIUM 30 MG/0.3ML ~~LOC~~ SOLN: 30.0000 mg | SUBCUTANEOUS | Status: DC

## 2015-05-08 MED ORDER — ACETAMINOPHEN 325 MG PO TABS: 650.0000 mg | ORAL_TABLET | Freq: Four times a day (QID) | ORAL | Status: DC | PRN

## 2015-05-08 MED ORDER — DEXTROSE 5 % IV SOLN
1.0000 g | Freq: Three times a day (TID) | INTRAVENOUS | Status: DC
  Administered 2015-05-08 – 2015-05-11 (×10): 1 g via INTRAVENOUS
  Filled 2015-05-08 (×14): qty 1

## 2015-05-08 MED ORDER — ONDANSETRON HCL 4 MG/2ML IJ SOLN: 4.0000 mg | Freq: Four times a day (QID) | INTRAMUSCULAR | Status: DC | PRN

## 2015-05-08 MED ORDER — SENNOSIDES-DOCUSATE SODIUM 8.6-50 MG PO TABS
1.0000 | ORAL_TABLET | Freq: Two times a day (BID) | ORAL | Status: DC
  Administered 2015-05-08 – 2015-05-11 (×6): 1 via ORAL
  Filled 2015-05-08 (×14): qty 1

## 2015-05-08 MED ORDER — SODIUM CHLORIDE 0.9 % IV BOLUS (SEPSIS)
500.0000 mL | Freq: Once | INTRAVENOUS | Status: AC
  Administered 2015-05-08: 500 mL via INTRAVENOUS

## 2015-05-08 MED ORDER — LEVETIRACETAM 250 MG PO TABS
250.0000 mg | ORAL_TABLET | Freq: Two times a day (BID) | ORAL | Status: DC
  Administered 2015-05-08 – 2015-05-10 (×6): 250 mg via ORAL
  Filled 2015-05-08 (×7): qty 1

## 2015-05-08 MED ORDER — SODIUM CHLORIDE 0.9 % IV SOLN: INTRAVENOUS | Status: AC

## 2015-05-08 MED ORDER — CETYLPYRIDINIUM CHLORIDE 0.05 % MT LIQD
7.0000 mL | Freq: Two times a day (BID) | OROMUCOSAL | Status: DC
  Administered 2015-05-08 – 2015-05-10 (×7): 7 mL via OROMUCOSAL

## 2015-05-08 MED ORDER — FENTANYL 50 MCG/HR TD PT72
50.0000 ug | MEDICATED_PATCH | TRANSDERMAL | Status: DC
  Administered 2015-05-08 – 2015-05-11 (×2): 50 ug via TRANSDERMAL
  Filled 2015-05-08 (×2): qty 1

## 2015-05-08 MED ORDER — IPRATROPIUM-ALBUTEROL 0.5-2.5 (3) MG/3ML IN SOLN: 3.0000 mL | Freq: Four times a day (QID) | RESPIRATORY_TRACT | Status: DC | PRN

## 2015-05-08 MED ORDER — METOPROLOL TARTRATE 25 MG PO TABS: 25.0000 mg | ORAL_TABLET | Freq: Every day | ORAL | Status: DC

## 2015-05-08 NOTE — H&P (Signed)
PCP:   Fredirick MaudlinHAWKINS,EDWARD L, MD   Chief Complaint:  Shortness of breath  HPI:  81 year old female who  has a past medical history of Seizures (HCC); Aspiration pneumonia (HCC); Dysphagia; Osteoporosis; Thyroid disease; Depression; MRSA (methicillin resistant staph aureus) culture positive; Respiratory failure (HCC); Hypokalemia; Reflux; Vertebral compression fracture (HCC); History of sinus cancer (1970's); Chronic pain; Hypertension; GERD (gastroesophageal reflux disease); and DNR (do not resuscitate). Today was brought to the ED from skilled facility after patient was found to be short of breath. When patient arrived in the ED she was somnolent and was given Narcan, which make patient more alert. She was put on BiPAP in the ED. Patient was recently treated with antibiotics for healthcare associated pneumonia the skilled facility. She denies chest pain, no nausea vomiting or diarrhea. Lab work showed white count of 17.9, troponin 0.40, potassium 5.4. Chest x-ray showed patchy bibasilar airspace opacity which may reflect atelectasis or infection or aspiration pneumonitis.  Allergies:   Allergies  Allergen Reactions  . Aricept [Donepezil Hcl]   . Bactrim [Sulfamethoxazole-Trimethoprim]   . Ciprofloxacin   . Darvocet [Propoxyphene N-Acetaminophen]   . Namenda [Memantine Hcl]   . Penicillins   . Vibramycin [Doxycycline Calcium]       Past Medical History  Diagnosis Date  . Seizures (HCC)   . Aspiration pneumonia (HCC)   . Dysphagia   . Osteoporosis   . Thyroid disease   . Depression   . MRSA (methicillin resistant staph aureus) culture positive   . Respiratory failure (HCC)   . Hypokalemia   . Reflux   . Vertebral compression fracture (HCC)     thoracic and lumbar  . History of sinus cancer 1970's    left side  . Chronic pain   . Hypertension   . GERD (gastroesophageal reflux disease)   . DNR (do not resuscitate)     Past Surgical History  Procedure Laterality Date  .  Kyphoplasty      Lumbar and thoracic  . Palate surgery  1970's    removal of hard and soft palate on left side  . Enucleation Left 1970's    left eye  . Cholecystectomy    . Appendectomy    . Cesarean section      Prior to Admission medications   Medication Sig Start Date End Date Taking? Authorizing Provider  Amino Acids-Protein Hydrolys (FEEDING SUPPLEMENT, PRO-STAT SUGAR FREE 64,) LIQD Take 30 mLs by mouth 2 (two) times daily.   Yes Historical Provider, MD  calcium-vitamin D (OSCAL WITH D) 500-200 MG-UNIT per tablet Take 1 tablet by mouth 3 (three) times daily. For osteoporosis   Yes Historical Provider, MD  Cranberry-Vitamin C-Probiotic (AZO CRANBERRY PO) Take 1 tablet by mouth 2 (two) times daily.   Yes Historical Provider, MD  docusate sodium (COLACE) 100 MG capsule Take 100 mg by mouth 2 (two) times daily. For constipation   Yes Historical Provider, MD  escitalopram (LEXAPRO) 10 MG tablet Take 10 mg by mouth daily. For depression   Yes Historical Provider, MD  fentaNYL (DURAGESIC - DOSED MCG/HR) 50 MCG/HR Place 50 mcg onto the skin every other day.   Yes Historical Provider, MD  furosemide (LASIX) 20 MG tablet Take 20 mg by mouth daily. For HTN/ edema   Yes Historical Provider, MD  HYDROcodone-acetaminophen (NORCO/VICODIN) 5-325 MG per tablet Take one tablet by mouth three times daily and every 6 hours as needed for pain Patient taking differently: Take 0.5 tablets by mouth 2 (two) times  daily.  01/26/14  Yes Mahima Pandey, MD  ipratropium-albuterol (DUONEB) 0.5-2.5 (3) MG/3ML SOLN Take 3 mLs by nebulization every 6 (six) hours as needed (Shortness Of Breath).   Yes Historical Provider, MD  iron polysaccharides (NIFEREX) 150 MG capsule Take 150 mg by mouth daily.   Yes Historical Provider, MD  levETIRAcetam (KEPPRA) 250 MG tablet Take 250 mg by mouth every 12 (twelve) hours. For seizures   Yes Historical Provider, MD  levothyroxine (SYNTHROID, LEVOTHROID) 175 MCG tablet Take 175 mcg by  mouth daily. For thyroid therapy   Yes Historical Provider, MD  loratadine (CLARITIN) 10 MG tablet Take 10 mg by mouth daily. For allergies   Yes Historical Provider, MD  metoprolol tartrate (LOPRESSOR) 25 MG tablet Take 25 mg by mouth daily. For HTN   Yes Historical Provider, MD  Multiple Vitamin (MULTIVITAMIN WITH MINERALS) TABS Take 1 tablet by mouth daily. For nutritional supplement   Yes Historical Provider, MD  ranitidine (ZANTAC) 150 MG tablet Take 150 mg by mouth at bedtime.   Yes Historical Provider, MD  rOPINIRole (REQUIP) 4 MG tablet Take 4 mg by mouth at bedtime. For parkinson disease   Yes Historical Provider, MD  sennosides-docusate sodium (SENOKOT-S) 8.6-50 MG tablet Take 1 tablet by mouth 2 (two) times daily. For constipation   Yes Historical Provider, MD  alum & mag hydroxide-simeth (MYLANTA) 200-200-20 MG/5ML suspension Take 30 mLs by mouth every 4 (four) hours as needed for indigestion or heartburn.    Historical Provider, MD  clindamycin (CLEOCIN) 300 MG capsule Take 1 capsule (300 mg total) by mouth 3 (three) times daily. 09/13/14   Kari Baars, MD  hydroxypropyl methylcellulose / hypromellose (ISOPTO TEARS / GONIOVISC) 2.5 % ophthalmic solution Place 1 drop into the right eye as needed for dry eyes.    Historical Provider, MD  magnesium hydroxide (MILK OF MAGNESIA) 400 MG/5ML suspension Take 30 mLs by mouth daily as needed for mild constipation.    Historical Provider, MD  phenylephrine-shark liver oil-mineral oil-petrolatum (PREPARATION H) 0.25-3-14-71.9 % rectal ointment Place 1 application rectally as needed for hemorrhoids.    Historical Provider, MD    Social History:  reports that she has never smoked. She does not have any smokeless tobacco history on file. She reports that she does not drink alcohol or use illicit drugs.  Family History  Problem Relation Age of Onset  . Cancer Father     Ceasar Mons Weights   05/07/15 2303  Weight: 43.092 kg (95 lb)    All the  positives are listed in BOLD  Review of Systems:  HEENT: Headache, blurred vision, runny nose, sore throat Neck: Hypothyroidism, hyperthyroidism,,lymphadenopathy Chest : Shortness of breath, history of COPD, Asthma Heart : Chest pain, history of coronary arterey disease GI:  Nausea, vomiting, diarrhea, constipation, GERD GU: Dysuria, urgency, frequency of urination, hematuria Neuro: Stroke, seizures, syncope Psych: Depression, anxiety, hallucinations   Physical Exam: Blood pressure 102/49, pulse 84, temperature 100.4 F (38 C), temperature source Oral, resp. rate 23, height  (1.499 m), weight 43.092 kg (95 lb), SpO2 99 %. Constitutional:   Patient is a cachectic appearing female, currently on BiPAP in no acute distress and cooperative with exam. Head: Normocephalic and atraumatic Mouth: Mucus membranes moist Eyes: Left eye bandaged Neck: Supple, No Thyromegaly Cardiovascular: RRR, S1 normal, S2 normal Pulmonary/Chest: Bilateral rhonchi Abdominal: Soft. Non-tender, non-distended, bowel sounds are normal, no masses, organomegaly, or guarding present.  Neurological: A&O x3, Strength is normal and symmetric bilaterally, cranial nerve II-XII are  grossly intact, no focal motor deficit, sensory intact to light touch bilaterally.  Extremities : No Cyanosis, Clubbing or Edema  Labs on Admission:  Basic Metabolic Panel:  Recent Labs Lab 05/07/15 2111  NA 138  K 5.4*  CL 92*  CO2 39*  GLUCOSE 198*  BUN 50*  CREATININE 1.97*  CALCIUM 9.8   CBC:  Recent Labs Lab 05/04/15 0715 05/07/15 2244  WBC 5.1 17.9*  NEUTROABS 3.6 16.8*  HGB 7.8* 8.7*  HCT 27.0* 29.2*  MCV 95.7 95.4  PLT 157 179   Cardiac Enzymes:  Recent Labs Lab 05/07/15 2111  TROPONINI 0.40*    BNP (last 3 results)  Recent Labs  08/11/14 2350 09/10/14 1945  BNP 212.0* 211.0*    Radiological Exams on Admission: Dg Chest 2 View  05/07/2015  CLINICAL DATA:  Shortness of Breath, cough and  congestion EXAM: CHEST - 2 VIEW COMPARISON:  04/18/2015 FINDINGS: Large hiatal hernia is again seen. The cardiac shadow is stable. Multilevel vertebral augmentation is seen. Increasing infiltrate is noted in the bases bilaterally slightly worse on the right than the left. No other focal abnormality is seen. IMPRESSION: Increasing bibasilar infiltrates. Electronically Signed   By: Alcide Clever M.D.   On: 05/07/2015 15:21   Dg Chest Port 1 View  05/07/2015  CLINICAL DATA:  I initial evaluation for acute shortness of breath. EXAM: PORTABLE CHEST 1 VIEW COMPARISON:  Prior study from 05/07/2015. FINDINGS: Patient is markedly rotated to the left. Moderate cardiomegaly is similar to previous. Mediastinal silhouette within normal limits. Prominent atheromatous plaque within the aortic arch. Large hiatal hernia again noted. Lungs are mildly hypoinflated. There are bibasilar are irregular parenchymal opacities, which may reflect atelectasis, infiltrates, or possibly aspiration. Small right pleural effusion is suspected. Vascular congestion without frank pulmonary edema. No pneumothorax. Osseous structures unchanged. Sequelae of prior vertebral augmentation noted within the thoracolumbar spine. IMPRESSION: 1. Patchy bibasilar airspace opacities, which may reflect atelectasis or sequela of infection or aspiration pneumonitis. 2. Stable cardiomegaly with diffuse pulmonary vascular congestion without overt pulmonary edema. 3. Suspect small right pleural effusion. 4. Large hiatal hernia. Electronically Signed   By: Rise Mu M.D.   On: 05/07/2015 22:16    EKG: Independently reviewed. Diffuse T-wave inversion, sinus rhythm   Assessment/Plan Active Problems:   HCAP (healthcare-associated pneumonia)   Acute on chronic respiratory failure with hypoxia (HCC)   Anemia   Elevated troponin  Healthcare associated pneumonia We'll admit the patient to stepdown unit and start vancomycin and Azactam per pharmacy  consultation. Follow blood culture  DuoNeb nebulizers every 6 hours when necessary  Elevated troponin Patient has mild elevation of troponin 0.40. EKG shows diffuse T-wave changes She is not a candidate for aggressive intervention I discussed with patient's daughter at bedside Continue aspirin 81 mg by mouth daily Patient currently not complaining of chest pain  Hyperkalemia Potassium was 5.4, patient started on IV fluids Check BMP in a.m.  Chronic anemia Likely aren't efficiency, continue iron polysaccharides 150 mg by mouth daily Patient has chronic anemia, today hemoglobin is 8.7 Check CBC in a.m.  DVT prophylaxis Lovenox  Code status: DO NOT RESUSCITATE  Family discussion: Admission, patients condition and plan of care including tests being ordered have been discussed with the patient and her daughters at bedside* who indicate understanding and agree with the plan and Code Status.   Time Spent on Admission: 60 min  LAMA,GAGAN S [facility] Pager: [phone] 05/08/2015, 12:20 AM  If 7PM-7AM, please contact night-coverage  [email or web address]  Password [email or web address]

## 2015-05-08 NOTE — Consult Note (Signed)
Consulting cardiologist: Dr. Jonelle Sidle  Reason for consultation: Abnormal troponin I  Clinical Summary [name] is a 81 y.o.female nursing home resident, currently admitted with hypoxic respiratory failure and suspected aspiration pneumonia. Chest x-ray shows patchy bibasilar airspace disease, also stable cardiomegaly and diffuse pulmonary vascular congestion with small right pleural effusion. She initially required BiPAP support, although this has been discontinued. Lab work does show a leukocytosis, blood cultures are sent and pending. She also has acute on chronic renal insufficiency with creatinine up to 1.9 from 1.2.   We are consulted related to abnormal troponin I levels, 0.40 - 0.37- 0.26. ECGs reviewed, and she does have transient anterolateral T-wave inversions, possibly ischemic in etiology. She does not provide a very detailed history, no active chest pain at this time. She has no known history of obstructive CAD or myocardial infarction, however previous imaging studies do show other findings of atherosclerosis.  Echocardiogram has been ordered and is pending at this time.   Allergies  Allergen Reactions  . Aricept [Donepezil Hcl]   . Bactrim [Sulfamethoxazole-Trimethoprim]   . Ciprofloxacin   . Darvocet [Propoxyphene N-Acetaminophen]   . Namenda [Memantine Hcl]   . Penicillins   . Vibramycin [Doxycycline Calcium]     Medications Scheduled Medications: . sodium chloride   Intravenous STAT  . antiseptic oral rinse  7 mL Mouth Rinse BID  . aspirin EC  81 mg Oral Daily  . docusate sodium  100 mg Oral BID  . enoxaparin (LOVENOX) injection  30 mg Subcutaneous Q24H  . escitalopram  10 mg Oral Daily  . feeding supplement (PRO-STAT SUGAR FREE 64)  30 mL Oral BID  . fentaNYL  50 mcg Transdermal Q72H  . ipratropium-albuterol  3 mL Nebulization Q6H  . iron polysaccharides  150 mg Oral Daily  . levETIRAcetam  250 mg Oral Q12H  . levothyroxine  175 mcg Oral QAC  breakfast  . loratadine  10 mg Oral Daily  . [START ON 05/09/2015] metoprolol tartrate  25 mg Oral Daily  . rOPINIRole  4 mg Oral QHS  . senna-docusate  1 tablet Oral BID  . sodium chloride  500 mL Intravenous Once    Infusions: . sodium chloride 75 mL/hr at 05/08/15 0600    PRN Medications: albuterol, ondansetron **OR** ondansetron (ZOFRAN) IV, polyvinyl alcohol   Past Medical History  Diagnosis Date  . Seizures (HCC)   . Aspiration pneumonia (HCC)   . Dysphagia   . Osteoporosis   . Hypothyroidism   . Depression   . MRSA (methicillin resistant staph aureus) culture positive   . Respiratory failure (HCC)   . Vertebral compression fracture (HCC)     Thoracic and lumbar  . History of sinus cancer 1970's    Left side  . Chronic pain   . Essential hypertension   . GERD (gastroesophageal reflux disease)   . DNR (do not resuscitate)     Past Surgical History  Procedure Laterality Date  . Kyphoplasty      Lumbar and thoracic  . Palate surgery  1970's    Removal of hard and soft palate on left side  . Enucleation Left 1970's  . Cholecystectomy    . Appendectomy    . Cesarean section      Family History  Problem Relation Age of Onset  . Cancer Father     Social History [name] reports that she has never smoked. She does not have any smokeless tobacco history on file. [name]  reports that she does not drink alcohol.  Review of Systems Complete review of systems negative except as otherwise outlined in the clinical summary and also the following. Chronic back pain.  Physical Examination Blood pressure 109/55, pulse 84, temperature 98.4 F (36.9 C), temperature source Core (Comment), resp. rate 19, height  (1.499 m), weight 96 lb 12.5 oz (43.9 kg), SpO2 97 %.  Intake/Output Summary (Last 24 hours) at 05/08/15 0852 Last data filed at 05/08/15 0600  Gross per 24 hour  Intake 606.25 ml  Output    150 ml  Net 456.25 ml   Telemetry: Sinus rhythm.     Gen.: Frail elderly woman, mildly short of breath at rest, no active chest pain. HEENT: Conjunctiva and lids normal, oropharynx clear. Neck: Supple, no elevated JVP or carotid bruits, no thyromegaly. Lungs: Scattered rhonchi and crackles, nonlabored breathing at rest. Cardiac: Regular rate and rhythm, no S3, 2/6 systolic murmur, no pericardial rub. Abdomen: Soft, nontender, bowel sounds present, no guarding or rebound. Extremities: No pitting edema, distal pulses 2+. Skin: Warm and dry. Musculoskeletal: Kyphoscoliosis noted. Neuropsychiatric: Alert and oriented x2, somnolent, but wakes up to voice.  Lab Results  Basic Metabolic Panel:  Recent Labs Lab 05/07/15 2111 05/08/15 0441  NA 138 139  K 5.4* 5.5*  CL 92* 99*  CO2 39* 35*  GLUCOSE 198* 113*  BUN 50* 51*  CREATININE 1.97* 1.81*  CALCIUM 9.8 9.2    Liver Function Tests:  Recent Labs Lab 05/08/15 0441  AST 27  ALT 10*  ALKPHOS 59  BILITOT 0.4  PROT 6.5  ALBUMIN 2.6*    CBC:  Recent Labs Lab 05/04/15 0715 05/07/15 2244 05/08/15 0441  WBC 5.1 17.9* 16.8*  NEUTROABS 3.6 16.8*  --   HGB 7.8* 8.7* 7.8*  HCT 27.0* 29.2* 26.2*  MCV 95.7 95.4 94.6  PLT 157 179 150    Cardiac Enzymes:  Recent Labs Lab 05/07/15 2111 05/08/15 05/08/15 0715  TROPONINI 0.40* 0.37* 0.26*    ECG Tracing from 05/07/2015 shows sinus rhythm with fairly diffuse T-wave inversions, predominantly ST-T wave abnormalities in anterolateral leads suggestive of ischemia.  Imaging Chest x-ray 05/07/2015: FINDINGS: Patient is markedly rotated to the left.  Moderate cardiomegaly is similar to previous. Mediastinal silhouette within normal limits. Prominent atheromatous plaque within the aortic arch. Large hiatal hernia again noted.  Lungs are mildly hypoinflated. There are bibasilar are irregular parenchymal opacities, which may reflect atelectasis, infiltrates, or possibly aspiration. Small right pleural effusion is  suspected. Vascular congestion without frank pulmonary edema. No pneumothorax.  Osseous structures unchanged. Sequelae of prior vertebral augmentation noted within the thoracolumbar spine.  IMPRESSION: 1. Patchy bibasilar airspace opacities, which may reflect atelectasis or sequela of infection or aspiration pneumonitis. 2. Stable cardiomegaly with diffuse pulmonary vascular congestion without overt pulmonary edema. 3. Suspect small right pleural effusion. 4. Large hiatal hernia.  Impression  1. Elevated troponin I, relatively flat pattern and likely indicative of demand ischemia in the face of hypoxic respiratory failure with suspected aspiration pneumonia. Patient almost certainly has some degree of underlying CAD at baseline. History is limited but she does not report any active chest pain. ECG does show ischemic change.  2. Hypoxic respiratory failure with suspected aspiration pneumonia. Management per primary team and Dr. Juanetta Gosling.  3. Acute on chronic renal insufficiency, creatinine up to 1.9. Possible component of volume contraction. She is being hydrated.  4. Severe osteoporosis and vertebral compression fractures with chronic pain and limited mobility.  5. DNR status.  Recommendations  Echocardiogram has been ordered for assessment of LVEF, we will review this. Anticipate medical therapy for suspected coronary atherosclerosis without other aggressive measures. Currently on aspirin, Lovenox, and Lopressor. Follow-up ECG in a.m. We will follow with you.  Jonelle SidleSamuel G. Riva Sesma, M.D., F.A.C.C.

## 2015-05-08 NOTE — Clinical Social Work Note (Signed)
Clinical Social Work Assessment  Patient Details  Name: [name] MRN: [id number] Date of Birth: [birth year]  Date of referral:  05/08/15               Reason for consult:  Discharge Planning                Permission sought to share information with:    Permission granted to share information::     Name::        Agency::     Relationship::     Contact Information:     Housing/Transportation Living arrangements for the past 2 months:  Skilled Nursing Facility Source of Information:  Adult Children Patient Interpreter Needed:  None Criminal Activity/Legal Involvement Pertinent to Current Situation/Hospitalization:  No - Comment as needed Significant Relationships:  Adult Children Lives with:  Facility Resident Do you feel safe going back to the place where you live?  Yes Need for family participation in patient care:  Yes (Comment)  Care giving concerns:  [name]    Social Worker assessment / plan:  CSW spoke with pt's daughter, [name], as pt sleeping and in ICU. Pt is well known to CSW due to previous admissions. [name] Family is very involved and supportive. Pt admitted due to aspiration pneumonia. [name]. She has called [name] this morning regarding a bed hold. [name] pt is nursing level of care and okay to return. Pt generally uses wheelchair at facility.   Employment status:  Retired Health and safety inspectornsurance information:  Medicare PT Recommendations:  Not assessed at this time Information / Referral to community resources:  Other (Comment Required) ([facility]  [name]   Patient/Family's Understanding of and Emotional Response to Diagnosis, Current Treatment, and Prognosis:  Pt's daughter very aware of admission diagnosis and medical history. She requests [name] in anticipated return when medically stable.   Emotional Assessment Appearance:  Appears stated age Attitude/Demeanor/Rapport:  Unable to Assess Affect (typically observed):  Unable to Assess Orientation:  Oriented to Self Alcohol / Substance use:  Not Applicable Psych involvement (Current and /or in the community):  No (Comment)  Discharge Needs  Concerns to be addressed:  Discharge Planning Concerns Readmission within the last 30 days:  No Current discharge risk:  None Barriers to Discharge:  Continued Medical Work up   [name], [name] Bratz [place], LCSW 05/08/2015, 11:11 AM [phone]

## 2015-05-08 NOTE — Progress Notes (Signed)
Subjective: She was transferred from the skilled care facility with what appears to be aspiration pneumonia. She has had episodes of aspiration pneumonia in the past. There was some question about her pain medications but her pain medications have been steadily reduced over the last 2 months. She denies chest pain. She is not on BiPAP now.  Objective: Vital signs in last 24 hours: Temp:  [98.4 F (36.9 C)-100.8 F (38.2 C)] 98.4 F (36.9 C) (12/19 0756) Pulse Rate:  [69-118] 84 (12/19 0808) Resp:  [12-31] 19 (12/19 0808) BP: (88-161)/(37-99) 109/55 mmHg (12/19 0600) SpO2:  [88 %-100 %] 97 % (12/19 0808) FiO2 (%):  [40 %] 40 % (12/19 0158) Weight:  [43.092 kg (95 lb)-43.9 kg (96 lb 12.5 oz)] 43.9 kg (96 lb 12.5 oz) (12/19 0129) Weight change:     Intake/Output from previous day: 12/18 0701 - 12/19 0700 In: 606.3 [I.V.:506.3; IV Piggyback:100] Out: 150 [Urine:150]  PHYSICAL EXAM General appearance: alert, moderate distress and Sleepy. 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 Resp: rales bilaterally Cardio: regular rate and rhythm, S1, S2 normal, no murmur, click, rub or gallop GI: soft, non-tender; bowel sounds normal; no masses,  no organomegaly Extremities: extremities normal, atraumatic, no cyanosis or edema  Lab Results:  Results for orders placed or performed during the hospital encounter of 05/07/15 (from the past 48 hour(s))  Troponin I     Status: Abnormal   Collection Time: 05/07/15  9:11 PM  Result Value Ref Range   Troponin I 0.40 (H) <0.031 ng/mL    Comment:        PERSISTENTLY INCREASED TROPONIN VALUES IN THE RANGE OF 0.04-0.49 ng/mL CAN BE SEEN IN:       -UNSTABLE ANGINA       -CONGESTIVE HEART FAILURE       -MYOCARDITIS       -CHEST TRAUMA       -ARRYHTHMIAS       -LATE PRESENTING MYOCARDIAL INFARCTION       -COPD   CLINICAL FOLLOW-UP RECOMMENDED.   Lactic acid, plasma     Status: None   Collection Time: 05/07/15  9:11 PM  Result Value Ref Range   Lactic Acid, Venous 1.3 0.5 - 2.0 mmol/L  Basic metabolic panel     Status: Abnormal   Collection Time: 05/07/15  9:11 PM  Result Value Ref Range   Sodium 138 135 - 145 mmol/L   Potassium 5.4 (H) 3.5 - 5.1 mmol/L   Chloride 92 (L) 101 - 111 mmol/L   CO2 39 (H) 22 - 32 mmol/L   Glucose, Bld 198 (H) 65 - 99 mg/dL   BUN 50 (H) 6 - 20 mg/dL   Creatinine, Ser 1.97 (H) 0.44 - 1.00 mg/dL   Calcium 9.8 8.9 - 10.3 mg/dL   GFR calc non Af Amer 21 (L) >60 mL/min   GFR calc Af Amer 25 (L) >60 mL/min    Comment: (NOTE) The eGFR has been calculated using the CKD EPI equation. This calculation has not been validated in all clinical situations. eGFR's persistently <60 mL/min signify possible Chronic Kidney Disease.    Anion gap 7 5 - 15  Urinalysis, Routine w reflex microscopic     Status: Abnormal   Collection Time: 05/07/15  9:29 PM  Result Value Ref Range   Color, Urine YELLOW YELLOW   APPearance CLEAR CLEAR   Specific Gravity, Urine 1.015 1.005 - 1.030   pH 6.0 5.0 - 8.0   Glucose, UA NEGATIVE  NEGATIVE mg/dL   Hgb urine dipstick NEGATIVE NEGATIVE   Bilirubin Urine NEGATIVE NEGATIVE   Ketones, ur NEGATIVE NEGATIVE mg/dL   Protein, ur 30 (A) NEGATIVE mg/dL   Nitrite NEGATIVE NEGATIVE   Leukocytes, UA NEGATIVE NEGATIVE  Urine microscopic-add on     Status: Abnormal   Collection Time: 05/07/15  9:29 PM  Result Value Ref Range   Squamous Epithelial / LPF NONE SEEN NONE SEEN   WBC, UA NONE SEEN 0 - 5 WBC/hpf   RBC / HPF 0-5 0 - 5 RBC/hpf   Bacteria, UA NONE SEEN NONE SEEN   Casts GRANULAR CAST (A) NEGATIVE   Urine-Other MUCOUS PRESENT   CBC with Differential     Status: Abnormal   Collection Time: 05/07/15 10:44 PM  Result Value Ref Range   WBC 17.9 (H) 4.0 - 10.5 K/uL   RBC 3.06 (L) 3.87 - 5.11 MIL/uL   Hemoglobin 8.7 (L) 12.0 - 15.0 g/dL   HCT 29.2 (L) 36.0 - 46.0 %   MCV 95.4 78.0 - 100.0 fL   MCH 28.4 26.0 - 34.0 pg   MCHC 29.8 (L) 30.0 - 36.0 g/dL   RDW 16.4 (H) 11.5 - 15.5 %    Platelets 179 150 - 400 K/uL   Neutrophils Relative % 94 %   Neutro Abs 16.8 (H) 1.7 - 7.7 K/uL   Lymphocytes Relative 2 %   Lymphs Abs 0.3 (L) 0.7 - 4.0 K/uL   Monocytes Relative 4 %   Monocytes Absolute 0.7 0.1 - 1.0 K/uL   Eosinophils Relative 0 %   Eosinophils Absolute 0.0 0.0 - 0.7 K/uL   Basophils Relative 0 %   Basophils Absolute 0.0 0.0 - 0.1 K/uL  Lactic acid, plasma     Status: None   Collection Time: 05/07/15 11:36 PM  Result Value Ref Range   Lactic Acid, Venous 1.0 0.5 - 2.0 mmol/L  Troponin I     Status: Abnormal   Collection Time: 05/08/15 12:00 AM  Result Value Ref Range   Troponin I 0.37 (H) <0.031 ng/mL    Comment:        PERSISTENTLY INCREASED TROPONIN VALUES IN THE RANGE OF 0.04-0.49 ng/mL CAN BE SEEN IN:       -UNSTABLE ANGINA       -CONGESTIVE HEART FAILURE       -MYOCARDITIS       -CHEST TRAUMA       -ARRYHTHMIAS       -LATE PRESENTING MYOCARDIAL INFARCTION       -COPD   CLINICAL FOLLOW-UP RECOMMENDED.   CBC     Status: Abnormal   Collection Time: 05/08/15  4:41 AM  Result Value Ref Range   WBC 16.8 (H) 4.0 - 10.5 K/uL   RBC 2.77 (L) 3.87 - 5.11 MIL/uL   Hemoglobin 7.8 (L) 12.0 - 15.0 g/dL   HCT 26.2 (L) 36.0 - 46.0 %   MCV 94.6 78.0 - 100.0 fL   MCH 28.2 26.0 - 34.0 pg   MCHC 29.8 (L) 30.0 - 36.0 g/dL   RDW 16.3 (H) 11.5 - 15.5 %   Platelets 150 150 - 400 K/uL  Comprehensive metabolic panel     Status: Abnormal   Collection Time: 05/08/15  4:41 AM  Result Value Ref Range   Sodium 139 135 - 145 mmol/L   Potassium 5.5 (H) 3.5 - 5.1 mmol/L   Chloride 99 (L) 101 - 111 mmol/L   CO2 35 (H) 22 - 32 mmol/L  Glucose, Bld 113 (H) 65 - 99 mg/dL   BUN 51 (H) 6 - 20 mg/dL   Creatinine, Ser 1.81 (H) 0.44 - 1.00 mg/dL   Calcium 9.2 8.9 - 10.3 mg/dL   Total Protein 6.5 6.5 - 8.1 g/dL   Albumin 2.6 (L) 3.5 - 5.0 g/dL   AST 27 15 - 41 U/L   ALT 10 (L) 14 - 54 U/L   Alkaline Phosphatase 59 38 - 126 U/L   Total Bilirubin 0.4 0.3 - 1.2 mg/dL   GFR  calc non Af Amer 23 (L) >60 mL/min   GFR calc Af Amer 27 (L) >60 mL/min    Comment: (NOTE) The eGFR has been calculated using the CKD EPI equation. This calculation has not been validated in all clinical situations. eGFR's persistently <60 mL/min signify possible Chronic Kidney Disease.    Anion gap 5 5 - 15  Troponin I     Status: Abnormal   Collection Time: 05/08/15  7:15 AM  Result Value Ref Range   Troponin I 0.26 (H) <0.031 ng/mL    Comment:        PERSISTENTLY INCREASED TROPONIN VALUES IN THE RANGE OF 0.04-0.49 ng/mL CAN BE SEEN IN:       -UNSTABLE ANGINA       -CONGESTIVE HEART FAILURE       -MYOCARDITIS       -CHEST TRAUMA       -ARRYHTHMIAS       -LATE PRESENTING MYOCARDIAL INFARCTION       -COPD   CLINICAL FOLLOW-UP RECOMMENDED.     ABGS No results for input(s): PHART, PO2ART, TCO2, HCO3 in the last 72 hours.  Invalid input(s): PCO2 CULTURES No results found for this or any previous visit (from the past 240 hour(s)). Studies/Results: Dg Chest 2 View  05/07/2015  CLINICAL DATA:  Shortness of Breath, cough and congestion EXAM: CHEST - 2 VIEW COMPARISON:  04/18/2015 FINDINGS: Large hiatal hernia is again seen. The cardiac shadow is stable. Multilevel vertebral augmentation is seen. Increasing infiltrate is noted in the bases bilaterally slightly worse on the right than the left. No other focal abnormality is seen. IMPRESSION: Increasing bibasilar infiltrates. Electronically Signed   By: Inez Catalina M.D.   On: 05/07/2015 15:21   Dg Chest Port 1 View  05/07/2015  CLINICAL DATA:  I initial evaluation for acute shortness of breath. EXAM: PORTABLE CHEST 1 VIEW COMPARISON:  Prior study from 05/07/2015. FINDINGS: Patient is markedly rotated to the left. Moderate cardiomegaly is similar to previous. Mediastinal silhouette within normal limits. Prominent atheromatous plaque within the aortic arch. Large hiatal hernia again noted. Lungs are mildly hypoinflated. There are  bibasilar are irregular parenchymal opacities, which may reflect atelectasis, infiltrates, or possibly aspiration. Small right pleural effusion is suspected. Vascular congestion without frank pulmonary edema. No pneumothorax. Osseous structures unchanged. Sequelae of prior vertebral augmentation noted within the thoracolumbar spine. IMPRESSION: 1. Patchy bibasilar airspace opacities, which may reflect atelectasis or sequela of infection or aspiration pneumonitis. 2. Stable cardiomegaly with diffuse pulmonary vascular congestion without overt pulmonary edema. 3. Suspect small right pleural effusion. 4. Large hiatal hernia. Electronically Signed   By: Jeannine Boga M.D.   On: 05/07/2015 22:16    Medications:  Prior to Admission:  Prescriptions prior to admission  Medication Sig Dispense Refill Last Dose  . Amino Acids-Protein Hydrolys (FEEDING SUPPLEMENT, PRO-STAT SUGAR FREE 64,) LIQD Take 30 mLs by mouth 2 (two) times daily.   05/07/2015  . calcium-vitamin D (OSCAL  WITH D) 500-200 MG-UNIT per tablet Take 1 tablet by mouth 3 (three) times daily. For osteoporosis   05/07/2015  . Cranberry-Vitamin C-Probiotic (AZO CRANBERRY PO) Take 1 tablet by mouth 2 (two) times daily.   05/07/2015  . docusate sodium (COLACE) 100 MG capsule Take 100 mg by mouth 2 (two) times daily. For constipation   05/07/2015  . escitalopram (LEXAPRO) 10 MG tablet Take 10 mg by mouth daily. For depression   05/07/2015  . fentaNYL (DURAGESIC - DOSED MCG/HR) 50 MCG/HR Place 50 mcg onto the skin every other day.   Unknown  . furosemide (LASIX) 20 MG tablet Take 20 mg by mouth daily. For HTN/ edema   05/07/2015  . HYDROcodone-acetaminophen (NORCO/VICODIN) 5-325 MG per tablet Take one tablet by mouth three times daily and every 6 hours as needed for pain (Patient taking differently: Take 0.5 tablets by mouth 2 (two) times daily. ) 210 tablet 0 05/07/2015  . ipratropium-albuterol (DUONEB) 0.5-2.5 (3) MG/3ML SOLN Take 3 mLs by  nebulization every 6 (six) hours as needed (Shortness Of Breath).   Unknown  . iron polysaccharides (NIFEREX) 150 MG capsule Take 150 mg by mouth daily.   05/07/2015  . levETIRAcetam (KEPPRA) 250 MG tablet Take 250 mg by mouth every 12 (twelve) hours. For seizures   05/07/2015  . levothyroxine (SYNTHROID, LEVOTHROID) 175 MCG tablet Take 175 mcg by mouth daily. For thyroid therapy   05/07/2015  . loratadine (CLARITIN) 10 MG tablet Take 10 mg by mouth daily. For allergies   05/07/2015  . metoprolol tartrate (LOPRESSOR) 25 MG tablet Take 25 mg by mouth daily. For HTN   05/07/2015 at 1000  . Multiple Vitamin (MULTIVITAMIN WITH MINERALS) TABS Take 1 tablet by mouth daily. For nutritional supplement   05/07/2015  . ranitidine (ZANTAC) 150 MG tablet Take 150 mg by mouth at bedtime.   05/06/2015  . rOPINIRole (REQUIP) 4 MG tablet Take 4 mg by mouth at bedtime. For parkinson disease   05/06/2015  . sennosides-docusate sodium (SENOKOT-S) 8.6-50 MG tablet Take 1 tablet by mouth 2 (two) times daily. For constipation   05/07/2015  . alum & mag hydroxide-simeth (MYLANTA) 200-200-20 MG/5ML suspension Take 30 mLs by mouth every 4 (four) hours as needed for indigestion or heartburn.   Unknown  . clindamycin (CLEOCIN) 300 MG capsule Take 1 capsule (300 mg total) by mouth 3 (three) times daily.   Taking  . hydroxypropyl methylcellulose / hypromellose (ISOPTO TEARS / GONIOVISC) 2.5 % ophthalmic solution Place 1 drop into the right eye as needed for dry eyes.   Unknown  . magnesium hydroxide (MILK OF MAGNESIA) 400 MG/5ML suspension Take 30 mLs by mouth daily as needed for mild constipation.   Unknown  . phenylephrine-shark liver oil-mineral oil-petrolatum (PREPARATION H) 0.25-3-14-71.9 % rectal ointment Place 1 application rectally as needed for hemorrhoids.   Unknown   Scheduled: . sodium chloride   Intravenous STAT  . antiseptic oral rinse  7 mL Mouth Rinse BID  . aspirin EC  81 mg Oral Daily  . docusate sodium   100 mg Oral BID  . enoxaparin (LOVENOX) injection  30 mg Subcutaneous Q24H  . escitalopram  10 mg Oral Daily  . feeding supplement (PRO-STAT SUGAR FREE 64)  30 mL Oral BID  . fentaNYL  50 mcg Transdermal Q72H  . ipratropium-albuterol  3 mL Nebulization Q6H  . iron polysaccharides  150 mg Oral Daily  . levETIRAcetam  250 mg Oral Q12H  . levothyroxine  175 mcg Oral QAC  breakfast  . loratadine  10 mg Oral Daily  . [START ON 05/09/2015] metoprolol tartrate  25 mg Oral Daily  . rOPINIRole  4 mg Oral QHS  . senna-docusate  1 tablet Oral BID  . sodium chloride  500 mL Intravenous Once   Continuous: . sodium chloride 75 mL/hr at 05/08/15 0600   SJG:[id number], ondansetron **OR** ondansetron (ZOFRAN) IV, polyvinyl alcohol  Assesment: She has healthcare associated pneumonia probably from aspiration. She has acute on chronic hypoxic respiratory failure.  Her blood pressure has been somewhat low so I think she may have some element of sepsis.  She has elevated troponin level and her electrocardiogram is not normal. I discussed with her daughter and told her that she is not a candidate for any sort of invasive treatment but they do request that she have cardiology consultation to determine whether she truly does have cardiac disease or whether this is demand ischemia.  She has seizure disorder but no seizures have been noted  She had an infected lesion on her right ankle and this has grown MRSA in the past.  She has confusion when she gets sick but at baseline is still oriented and alert  She is anemic which is chronic  She has acute kidney injury I think from dehydration  Her potassium level is up related to the acute kidney injury and its actually somewhat worse than earlier  She has some element of malnutrition  At baseline she has severe osteoporosis with multiple fractures but none recently Active Problems:   HCAP (healthcare-associated pneumonia)   Acute on chronic respiratory  failure with hypoxia (HCC)   Anemia   Elevated troponin   Pressure ulcer    Plan: Continue current treatments. She is going to have increased fluid replacement. Cardiology consultation as noted. Echocardiogram. This morning she is too sleepy to try to feed her but she could go back on thickened liquids.    LOS: 1 day   Calley Drenning L 05/08/2015, 8:22 AM

## 2015-05-08 NOTE — Progress Notes (Signed)
Patient off BiPAP on 2 lpm / [place] BiPAP on stand by patient is awake.

## 2015-05-08 NOTE — Progress Notes (Signed)
Notified Dr. Juanetta GoslingHawkins regarding patient temp 100.8 new orders given.

## 2015-05-08 NOTE — Progress Notes (Signed)
ANTIBIOTIC CONSULT NOTE - INITIAL  Pharmacy Consult for Vancomycin and Aztreonam Indication: pneumonia  Allergies  Allergen Reactions  . Aricept [Donepezil Hcl]   . Bactrim [Sulfamethoxazole-Trimethoprim]   . Ciprofloxacin   . Darvocet [Propoxyphene N-Acetaminophen]   . Namenda [Memantine Hcl]   . Penicillins   . Vibramycin [Doxycycline Calcium]     Patient Measurements: Height: 4\' 11"  (149.9 cm) Weight: 96 lb 12.5 oz (43.9 kg) IBW/kg (Calculated) : 43.2  Vital Signs: Temp: 98.4 F (36.9 C) (12/19 0756) Temp Source: Core (Comment) (12/19 0756) BP: 109/55 mmHg (12/19 0600) Pulse Rate: 84 (12/19 0808) Intake/Output from previous day: 12/18 0701 - 12/19 0700 In: 606.3 [I.V.:506.3; IV Piggyback:100] Out: 150 [Urine:150] Intake/Output from this shift:    Labs:  Recent Labs  05/07/15 2111 05/07/15 2244 05/08/15 0441  WBC  --  17.9* 16.8*  HGB  --  8.7* 7.8*  PLT  --  179 150  CREATININE 1.97*  --  1.81*   Estimated Creatinine Clearance: 14.1 mL/min (by C-G formula based on Cr of 1.81). No results for input(s): VANCOTROUGH, VANCOPEAK, VANCORANDOM, GENTTROUGH, GENTPEAK, GENTRANDOM, TOBRATROUGH, TOBRAPEAK, TOBRARND, AMIKACINPEAK, AMIKACINTROU, AMIKACIN in the last 72 hours.   Microbiology: Recent Results (from the past 720 hour(s))  Culture, blood (routine x 2)     Status: None (Preliminary result)   Collection Time: 05/07/15 11:36 PM  Result Value Ref Range Status   Specimen Description LEFT ANTECUBITAL  Final   Special Requests BOTTLES DRAWN AEROBIC AND ANAEROBIC 6CC EACH  Final   Culture NO GROWTH < 12 HOURS  Final   Report Status PENDING  Incomplete  Culture, blood (routine x 2)     Status: None (Preliminary result)   Collection Time: 05/07/15 11:46 PM  Result Value Ref Range Status   Specimen Description LEFT ANTECUBITAL  Final   Special Requests BOTTLES DRAWN AEROBIC AND ANAEROBIC 8CC EACH  Final   Culture NO GROWTH < 12 HOURS  Final   Report Status  PENDING  Incomplete    Medical History: Past Medical History  Diagnosis Date  . Seizures (HCC)   . Aspiration pneumonia (HCC)   . Dysphagia   . Osteoporosis   . Hypothyroidism   . Depression   . MRSA (methicillin resistant staph aureus) culture positive   . Respiratory failure (HCC)   . Vertebral compression fracture (HCC)     Thoracic and lumbar  . History of sinus cancer 1970's    Left side  . Chronic pain   . Essential hypertension   . GERD (gastroesophageal reflux disease)   . DNR (do not resuscitate)     Medications:  See med rec  Assessment: 81 yo female SNF resident seen in the ED with respiratory distress and possible aspiration pneumonia. Pt was SOB, and somewhat somnolent.  Elevated WBC, low blood pressure, acute renal failure, and anemic.  Blood cx pending. Empiric antibiotics to be started with Vancomycin and Azactam.   Goal of Therapy:  Vancomycin trough level 15-20 mcg/ml  Plan:  Vancomycin 500mg  IV q48h(dose given in ED at midnight) Aztreonam 1gm IV q8h Measure antibiotic drug levels at steady state Follow up culture results  Elder CyphersLorie Dayannara Pascal, BS Pharm D, BCPS Clinical Pharmacist Pager [phone] 05/08/2015,8:40 AM

## 2015-05-09 LAB — CBC WITH DIFFERENTIAL/PLATELET
BASOS ABS: 0 10*3/uL (ref 0.0–0.1)
Basophils Relative: 0 %
EOS ABS: 0 10*3/uL (ref 0.0–0.7)
Eosinophils Relative: 0 %
HEMATOCRIT: 26 % — AB (ref 36.0–46.0)
HEMOGLOBIN: 7.9 g/dL — AB (ref 12.0–15.0)
Lymphocytes Relative: 6 %
Lymphs Abs: 0.5 10*3/uL — ABNORMAL LOW (ref 0.7–4.0)
MCH: 28.4 pg (ref 26.0–34.0)
MCHC: 30.4 g/dL (ref 30.0–36.0)
MCV: 93.5 fL (ref 78.0–100.0)
MONOS PCT: 3 %
Monocytes Absolute: 0.3 10*3/uL (ref 0.1–1.0)
NEUTROS ABS: 8.8 10*3/uL — AB (ref 1.7–7.7)
NEUTROS PCT: 91 %
Platelets: 157 10*3/uL (ref 150–400)
RBC: 2.78 MIL/uL — ABNORMAL LOW (ref 3.87–5.11)
RDW: 16.4 % — ABNORMAL HIGH (ref 11.5–15.5)
WBC: 9.7 10*3/uL (ref 4.0–10.5)

## 2015-05-09 LAB — URINE CULTURE: CULTURE: NO GROWTH

## 2015-05-09 LAB — BASIC METABOLIC PANEL
ANION GAP: 6 (ref 5–15)
BUN: 37 mg/dL — ABNORMAL HIGH (ref 6–20)
CALCIUM: 8.4 mg/dL — AB (ref 8.9–10.3)
CHLORIDE: 105 mmol/L (ref 101–111)
CO2: 29 mmol/L (ref 22–32)
CREATININE: 1.21 mg/dL — AB (ref 0.44–1.00)
GFR calc non Af Amer: 38 mL/min — ABNORMAL LOW (ref >60)
GFR, EST AFRICAN AMERICAN: 44 mL/min — AB (ref >60)
Glucose, Bld: 85 mg/dL (ref 65–99)
Potassium: 4.6 mmol/L (ref 3.5–5.1)
SODIUM: 140 mmol/L (ref 135–145)

## 2015-05-09 LAB — MRSA PCR SCREENING: MRSA BY PCR: NEGATIVE

## 2015-05-09 MED ORDER — ATORVASTATIN CALCIUM 20 MG PO TABS
20.0000 mg | ORAL_TABLET | Freq: Every day | ORAL | Status: DC
  Administered 2015-05-09 – 2015-05-10 (×2): 20 mg via ORAL
  Filled 2015-05-09 (×2): qty 1

## 2015-05-09 MED ORDER — ENOXAPARIN SODIUM 30 MG/0.3ML ~~LOC~~ SOLN
30.0000 mg | SUBCUTANEOUS | Status: DC
  Administered 2015-05-10 – 2015-05-11 (×2): 30 mg via SUBCUTANEOUS
  Filled 2015-05-09 (×2): qty 0.3

## 2015-05-09 MED ORDER — METHYLPREDNISOLONE SODIUM SUCC 40 MG IJ SOLR
40.0000 mg | Freq: Two times a day (BID) | INTRAMUSCULAR | Status: DC
  Administered 2015-05-09 – 2015-05-11 (×5): 40 mg via INTRAVENOUS
  Filled 2015-05-09 (×5): qty 1

## 2015-05-09 MED ORDER — METOPROLOL TARTRATE 25 MG PO TABS
25.0000 mg | ORAL_TABLET | Freq: Three times a day (TID) | ORAL | Status: DC
  Administered 2015-05-09 – 2015-05-11 (×7): 25 mg via ORAL
  Filled 2015-05-09 (×7): qty 1

## 2015-05-09 NOTE — Clinical Documentation Improvement (Signed)
Pulmonology  Can the diagnosis of pressure ulcer be further specified?   Document if pressure ulcer with stage is Present on Admission   Document Site with laterality - Elbow, Back (upper/lower), Sacral, Hip, Buttock, Ankle, Heel, Head, Other (Specify)  Pressure Ulcer Stage - Stage1, Stage 2, Stage 3, Stage 4, Unstageable, Unspecified, Unable to Clinically Determine  Document any associated diagnoses/conditions such as: with gangrene  Other       Clinically Undetermined  Please update your documentation within the medical record to reflect your response to this query. Thank you.  Supporting Information: "Pressure ulcer"  Please exercise your independent, professional judgment when responding. A specific answer is not anticipated or expected.  Thank You, Nevin BloodgoodJoan B Akirra Lacerda, RN, BSN, CCDS,Clinical Documentation Specialist:  [phone]  [phone]=[facility]- Health Information Management

## 2015-05-09 NOTE — Progress Notes (Signed)
Subjective: She continues to have some fever. Last electrocardiogram showed fairly marked ST-T wave abnormalities with T-wave inversion. Her troponin level is trending down. She has no chest pain. She is still coughing. No other new complaints no seizures.  Objective: Vital signs in last 24 hours: Temp:  [98 F (36.7 C)-101.5 F (38.6 C)] 100.6 F (38.1 C) (12/20 0600) Pulse Rate:  [84-131] 100 (12/20 0600) Resp:  [18-41] 22 (12/20 0600) BP: (114-156)/(53-85) 154/74 mmHg (12/20 0600) SpO2:  [86 %-100 %] 94 % (12/20 0600) FiO2 (%):  [40 %] 40 % (12/20 0400) Weight:  [45.6 kg (100 lb 8.5 oz)] 45.6 kg (100 lb 8.5 oz) (12/20 0600) Weight change: 2.508 kg (5 lb 8.5 oz)    Intake/Output from previous day: 12/19 0701 - 12/20 0700 In: 2500 [I.V.:1800; IV Piggyback:700] Out: 1000 [Urine:1000]  PHYSICAL EXAM General appearance: alert, cooperative, mild distress and 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 Resp: rales bilaterally and rhonchi bilaterally Cardio: regular rate and rhythm, S1, S2 normal, no murmur, click, rub or gallop GI: soft, non-tender; bowel sounds normal; no masses,  no organomegaly Extremities: extremities normal, atraumatic, no cyanosis or edema  Lab Results:  Results for orders placed or performed during the hospital encounter of 05/07/15 (from the past 48 hour(s))  Troponin I     Status: Abnormal   Collection Time: 05/07/15  9:11 PM  Result Value Ref Range   Troponin I 0.40 (H) <0.031 ng/mL    Comment:        PERSISTENTLY INCREASED TROPONIN VALUES IN THE RANGE OF 0.04-0.49 ng/mL CAN BE SEEN IN:       -UNSTABLE ANGINA       -CONGESTIVE HEART FAILURE       -MYOCARDITIS       -CHEST TRAUMA       -ARRYHTHMIAS       -LATE PRESENTING MYOCARDIAL INFARCTION       -COPD   CLINICAL FOLLOW-UP RECOMMENDED.   Lactic acid, plasma     Status: None   Collection Time: 05/07/15  9:11 PM  Result Value Ref Range   Lactic Acid, Venous 1.3 0.5 - 2.0 mmol/L  Basic metabolic  panel     Status: Abnormal   Collection Time: 05/07/15  9:11 PM  Result Value Ref Range   Sodium 138 135 - 145 mmol/L   Potassium 5.4 (H) 3.5 - 5.1 mmol/L   Chloride 92 (L) 101 - 111 mmol/L   CO2 39 (H) 22 - 32 mmol/L   Glucose, Bld 198 (H) 65 - 99 mg/dL   BUN 50 (H) 6 - 20 mg/dL   Creatinine, Ser 1.97 (H) 0.44 - 1.00 mg/dL   Calcium 9.8 8.9 - 10.3 mg/dL   GFR calc non Af Amer 21 (L) >60 mL/min   GFR calc Af Amer 25 (L) >60 mL/min    Comment: (NOTE) The eGFR has been calculated using the CKD EPI equation. This calculation has not been validated in all clinical situations. eGFR's persistently <60 mL/min signify possible Chronic Kidney Disease.    Anion gap 7 5 - 15  Urinalysis, Routine w reflex microscopic     Status: Abnormal   Collection Time: 05/07/15  9:29 PM  Result Value Ref Range   Color, Urine YELLOW YELLOW   APPearance CLEAR CLEAR   Specific Gravity, Urine 1.015 1.005 - 1.030   pH 6.0 5.0 - 8.0   Glucose, UA NEGATIVE NEGATIVE mg/dL   Hgb urine dipstick NEGATIVE NEGATIVE   Bilirubin  Urine NEGATIVE NEGATIVE   Ketones, ur NEGATIVE NEGATIVE mg/dL   Protein, ur 30 (A) NEGATIVE mg/dL   Nitrite NEGATIVE NEGATIVE   Leukocytes, UA NEGATIVE NEGATIVE  Urine microscopic-add on     Status: Abnormal   Collection Time: 05/07/15  9:29 PM  Result Value Ref Range   Squamous Epithelial / LPF NONE SEEN NONE SEEN   WBC, UA NONE SEEN 0 - 5 WBC/hpf   RBC / HPF 0-5 0 - 5 RBC/hpf   Bacteria, UA NONE SEEN NONE SEEN   Casts GRANULAR CAST (A) NEGATIVE   Urine-Other MUCOUS PRESENT   CBC with Differential     Status: Abnormal   Collection Time: 05/07/15 10:44 PM  Result Value Ref Range   WBC 17.9 (H) 4.0 - 10.5 K/uL   RBC 3.06 (L) 3.87 - 5.11 MIL/uL   Hemoglobin 8.7 (L) 12.0 - 15.0 g/dL   HCT 29.2 (L) 36.0 - 46.0 %   MCV 95.4 78.0 - 100.0 fL   MCH 28.4 26.0 - 34.0 pg   MCHC 29.8 (L) 30.0 - 36.0 g/dL   RDW 16.4 (H) 11.5 - 15.5 %   Platelets 179 150 - 400 K/uL   Neutrophils Relative %  94 %   Neutro Abs 16.8 (H) 1.7 - 7.7 K/uL   Lymphocytes Relative 2 %   Lymphs Abs 0.3 (L) 0.7 - 4.0 K/uL   Monocytes Relative 4 %   Monocytes Absolute 0.7 0.1 - 1.0 K/uL   Eosinophils Relative 0 %   Eosinophils Absolute 0.0 0.0 - 0.7 K/uL   Basophils Relative 0 %   Basophils Absolute 0.0 0.0 - 0.1 K/uL  Lactic acid, plasma     Status: None   Collection Time: 05/07/15 11:36 PM  Result Value Ref Range   Lactic Acid, Venous 1.0 0.5 - 2.0 mmol/L  Culture, blood (routine x 2)     Status: None (Preliminary result)   Collection Time: 05/07/15 11:36 PM  Result Value Ref Range   Specimen Description LEFT ANTECUBITAL    Special Requests BOTTLES DRAWN AEROBIC AND ANAEROBIC 6CC EACH    Culture NO GROWTH < 12 HOURS    Report Status PENDING   Culture, blood (routine x 2)     Status: None (Preliminary result)   Collection Time: 05/07/15 11:46 PM  Result Value Ref Range   Specimen Description LEFT ANTECUBITAL    Special Requests BOTTLES DRAWN AEROBIC AND ANAEROBIC 8CC EACH    Culture NO GROWTH < 12 HOURS    Report Status PENDING   Troponin I     Status: Abnormal   Collection Time: 05/08/15 12:00 AM  Result Value Ref Range   Troponin I 0.37 (H) <0.031 ng/mL    Comment:        PERSISTENTLY INCREASED TROPONIN VALUES IN THE RANGE OF 0.04-0.49 ng/mL CAN BE SEEN IN:       -UNSTABLE ANGINA       -CONGESTIVE HEART FAILURE       -MYOCARDITIS       -CHEST TRAUMA       -ARRYHTHMIAS       -LATE PRESENTING MYOCARDIAL INFARCTION       -COPD   CLINICAL FOLLOW-UP RECOMMENDED.   CBC     Status: Abnormal   Collection Time: 05/08/15  4:41 AM  Result Value Ref Range   WBC 16.8 (H) 4.0 - 10.5 K/uL   RBC 2.77 (L) 3.87 - 5.11 MIL/uL   Hemoglobin 7.8 (L) 12.0 - 15.0 g/dL  HCT 26.2 (L) 36.0 - 46.0 %   MCV 94.6 78.0 - 100.0 fL   MCH 28.2 26.0 - 34.0 pg   MCHC 29.8 (L) 30.0 - 36.0 g/dL   RDW 16.3 (H) 11.5 - 15.5 %   Platelets 150 150 - 400 K/uL  Comprehensive metabolic panel     Status: Abnormal    Collection Time: 05/08/15  4:41 AM  Result Value Ref Range   Sodium 139 135 - 145 mmol/L   Potassium 5.5 (H) 3.5 - 5.1 mmol/L   Chloride 99 (L) 101 - 111 mmol/L   CO2 35 (H) 22 - 32 mmol/L   Glucose, Bld 113 (H) 65 - 99 mg/dL   BUN 51 (H) 6 - 20 mg/dL   Creatinine, Ser 1.81 (H) 0.44 - 1.00 mg/dL   Calcium 9.2 8.9 - 10.3 mg/dL   Total Protein 6.5 6.5 - 8.1 g/dL   Albumin 2.6 (L) 3.5 - 5.0 g/dL   AST 27 15 - 41 U/L   ALT 10 (L) 14 - 54 U/L   Alkaline Phosphatase 59 38 - 126 U/L   Total Bilirubin 0.4 0.3 - 1.2 mg/dL   GFR calc non Af Amer 23 (L) >60 mL/min   GFR calc Af Amer 27 (L) >60 mL/min    Comment: (NOTE) The eGFR has been calculated using the CKD EPI equation. This calculation has not been validated in all clinical situations. eGFR's persistently <60 mL/min signify possible Chronic Kidney Disease.    Anion gap 5 5 - 15  Troponin I     Status: Abnormal   Collection Time: 05/08/15  7:15 AM  Result Value Ref Range   Troponin I 0.26 (H) <0.031 ng/mL    Comment:        PERSISTENTLY INCREASED TROPONIN VALUES IN THE RANGE OF 0.04-0.49 ng/mL CAN BE SEEN IN:       -UNSTABLE ANGINA       -CONGESTIVE HEART FAILURE       -MYOCARDITIS       -CHEST TRAUMA       -ARRYHTHMIAS       -LATE PRESENTING MYOCARDIAL INFARCTION       -COPD   CLINICAL FOLLOW-UP RECOMMENDED.   Troponin I     Status: Abnormal   Collection Time: 05/08/15  1:28 PM  Result Value Ref Range   Troponin I 0.16 (H) <0.031 ng/mL    Comment:        PERSISTENTLY INCREASED TROPONIN VALUES IN THE RANGE OF 0.04-0.49 ng/mL CAN BE SEEN IN:       -UNSTABLE ANGINA       -CONGESTIVE HEART FAILURE       -MYOCARDITIS       -CHEST TRAUMA       -ARRYHTHMIAS       -LATE PRESENTING MYOCARDIAL INFARCTION       -COPD   CLINICAL FOLLOW-UP RECOMMENDED.     ABGS No results for input(s): PHART, PO2ART, TCO2, HCO3 in the last 72 hours.  Invalid input(s): PCO2 CULTURES Recent Results (from the past 240 hour(s))  Culture,  blood (routine x 2)     Status: None (Preliminary result)   Collection Time: 05/07/15 11:36 PM  Result Value Ref Range Status   Specimen Description LEFT ANTECUBITAL  Final   Special Requests BOTTLES DRAWN AEROBIC AND ANAEROBIC [place]  Final   Culture NO GROWTH < 12 HOURS  Final   Report Status PENDING  Incomplete  Culture, blood (routine x 2)  Status: None (Preliminary result)   Collection Time: 05/07/15 11:46 PM  Result Value Ref Range Status   Specimen Description LEFT ANTECUBITAL  Final   Special Requests BOTTLES DRAWN AEROBIC AND ANAEROBIC [place]  Final   Culture NO GROWTH < 12 HOURS  Final   Report Status PENDING  Incomplete   Studies/Results: Dg Chest 2 View  05/07/2015  CLINICAL DATA:  Shortness of Breath, cough and congestion EXAM: CHEST - 2 VIEW COMPARISON:  04/18/2015 FINDINGS: Large hiatal hernia is again seen. The cardiac shadow is stable. Multilevel vertebral augmentation is seen. Increasing infiltrate is noted in the bases bilaterally slightly worse on the right than the left. No other focal abnormality is seen. IMPRESSION: Increasing bibasilar infiltrates. Electronically Signed   By: Inez Catalina M.D.   On: 05/07/2015 15:21   Dg Chest Port 1 View  05/07/2015  CLINICAL DATA:  I initial evaluation for acute shortness of breath. EXAM: PORTABLE CHEST 1 VIEW COMPARISON:  Prior study from 05/07/2015. FINDINGS: Patient is markedly rotated to the left. Moderate cardiomegaly is similar to previous. Mediastinal silhouette within normal limits. Prominent atheromatous plaque within the aortic arch. Large hiatal hernia again noted. Lungs are mildly hypoinflated. There are bibasilar are irregular parenchymal opacities, which may reflect atelectasis, infiltrates, or possibly aspiration. Small right pleural effusion is suspected. Vascular congestion without frank pulmonary edema. No pneumothorax. Osseous structures unchanged. Sequelae of prior vertebral augmentation noted within the  thoracolumbar spine. IMPRESSION: 1. Patchy bibasilar airspace opacities, which may reflect atelectasis or sequela of infection or aspiration pneumonitis. 2. Stable cardiomegaly with diffuse pulmonary vascular congestion without overt pulmonary edema. 3. Suspect small right pleural effusion. 4. Large hiatal hernia. Electronically Signed   By: Jeannine Boga M.D.   On: 05/07/2015 22:16    Medications:  Prior to Admission:  Prescriptions prior to admission  Medication Sig Dispense Refill Last Dose  . Amino Acids-Protein Hydrolys (FEEDING SUPPLEMENT, PRO-STAT SUGAR FREE 64,) LIQD Take 30 mLs by mouth 2 (two) times daily.   05/07/2015  . calcium-vitamin D (OSCAL WITH D) 500-200 MG-UNIT per tablet Take 1 tablet by mouth 3 (three) times daily. For osteoporosis   05/07/2015  . Cranberry-Vitamin C-Probiotic (AZO CRANBERRY PO) Take 1 tablet by mouth 2 (two) times daily.   05/07/2015  . docusate sodium (COLACE) 100 MG capsule Take 100 mg by mouth 2 (two) times daily. For constipation   05/07/2015  . escitalopram (LEXAPRO) 10 MG tablet Take 10 mg by mouth daily. For depression   05/07/2015  . fentaNYL (DURAGESIC - DOSED MCG/HR) 50 MCG/HR Place 50 mcg onto the skin every other day.   Unknown  . furosemide (LASIX) 20 MG tablet Take 20 mg by mouth daily. For HTN/ edema   05/07/2015  . HYDROcodone-acetaminophen (NORCO/VICODIN) 5-325 MG per tablet Take one tablet by mouth three times daily and every 6 hours as needed for pain (Patient taking differently: Take 0.5 tablets by mouth 2 (two) times daily. ) 210 tablet 0 05/07/2015  . ipratropium-albuterol (DUONEB) 0.5-2.5 (3) MG/3ML SOLN Take 3 mLs by nebulization every 6 (six) hours as needed (Shortness Of Breath).   Unknown  . iron polysaccharides (NIFEREX) 150 MG capsule Take 150 mg by mouth daily.   05/07/2015  . levETIRAcetam (KEPPRA) 250 MG tablet Take 250 mg by mouth every 12 (twelve) hours. For seizures   05/07/2015  . levothyroxine (SYNTHROID, LEVOTHROID)  175 MCG tablet Take 175 mcg by mouth daily. For thyroid therapy   05/07/2015  . loratadine (CLARITIN) 10  MG tablet Take 10 mg by mouth daily. For allergies   05/07/2015  . metoprolol tartrate (LOPRESSOR) 25 MG tablet Take 25 mg by mouth daily. For HTN   05/07/2015 at 1000  . Multiple Vitamin (MULTIVITAMIN WITH MINERALS) TABS Take 1 tablet by mouth daily. For nutritional supplement   05/07/2015  . ranitidine (ZANTAC) 150 MG tablet Take 150 mg by mouth at bedtime.   05/06/2015  . rOPINIRole (REQUIP) 4 MG tablet Take 4 mg by mouth at bedtime. For parkinson disease   05/06/2015  . sennosides-docusate sodium (SENOKOT-S) 8.6-50 MG tablet Take 1 tablet by mouth 2 (two) times daily. For constipation   05/07/2015  . alum & mag hydroxide-simeth (MYLANTA) 200-200-20 MG/5ML suspension Take 30 mLs by mouth every 4 (four) hours as needed for indigestion or heartburn.   Unknown  . clindamycin (CLEOCIN) 300 MG capsule Take 1 capsule (300 mg total) by mouth 3 (three) times daily.   Taking  . hydroxypropyl methylcellulose / hypromellose (ISOPTO TEARS / GONIOVISC) 2.5 % ophthalmic solution Place 1 drop into the right eye as needed for dry eyes.   Unknown  . magnesium hydroxide (MILK OF MAGNESIA) 400 MG/5ML suspension Take 30 mLs by mouth daily as needed for mild constipation.   Unknown  . phenylephrine-shark liver oil-mineral oil-petrolatum (PREPARATION H) 0.25-3-14-71.9 % rectal ointment Place 1 application rectally as needed for hemorrhoids.   Unknown   Scheduled: . antiseptic oral rinse  7 mL Mouth Rinse BID  . aspirin EC  81 mg Oral Daily  . aztreonam  1 g Intravenous 3 times per day  . docusate sodium  100 mg Oral BID  . enoxaparin (LOVENOX) injection  20 mg Subcutaneous Q24H  . escitalopram  10 mg Oral Daily  . feeding supplement (PRO-STAT SUGAR FREE 64)  30 mL Oral BID  . fentaNYL  50 mcg Transdermal Q72H  . ipratropium-albuterol  3 mL Nebulization Q6H  . iron polysaccharides  150 mg Oral Daily  .  levETIRAcetam  250 mg Oral Q12H  . levothyroxine  175 mcg Oral QAC breakfast  . loratadine  10 mg Oral Daily  . methylPREDNISolone (SOLU-MEDROL) injection  40 mg Intravenous Q12H  . metoprolol tartrate  25 mg Oral Daily  . rOPINIRole  4 mg Oral QHS  . senna-docusate  1 tablet Oral BID  . [START ON 05/10/2015] vancomycin  500 mg Intravenous Q48H   Continuous: . sodium chloride 75 mL/hr at 05/09/15 0600   YQI:[id number], acetaminophen, albuterol, metoprolol, ondansetron **OR** ondansetron (ZOFRAN) IV, polyvinyl alcohol  Assesment: She has healthcare associated pneumonia. I think she may have had mild sepsis which has responded to IV fluids. She is still coughing and congested and still having fever. She is still short of breath and hypoxic  She has chronic aspiration and her pneumonia may very well be from aspiration again. She is using dietary restrictions at the skilled care facility but she is still high risk for aspiration  She has elevated troponin level which is coming down. She has an abnormal electrocardiogram suggestive of ischemia. She does not appear to have acute coronary syndrome. Cardiology consultation is noted and appreciated. Echocardiogram does not show any wall motion abnormalities  She has anemia and CBC is pending  She was dehydrated with elevated BUN and creatinine and acute kidney injury and her basic metabolic profile is pending at this point. She is on IV fluids and has received boluses 2 yesterday  She has some element of malnutrition and has been on feeding  supplements.  She has mild dementia which generally gives her trouble when she is sick and she is pretty clear when she is not  She has had multiple osteoporotic fractures and probably has some ventilation/perfusion mismatch from kyphosis Active Problems:   HCAP (healthcare-associated pneumonia)   Acute on chronic respiratory failure with hypoxia (HCC)   Anemia   Elevated troponin   Pressure  ulcer    Plan: Continue current treatments. Await laboratory work. Depending on results of laboratory work she may need more fluid and may potentially need blood transfusion    LOS: 2 days   [name] L 05/09/2015, 7:34 AM

## 2015-05-09 NOTE — Progress Notes (Signed)
Consulting cardiologist: Dr. Jonelle SidleSamuel G. Tregan Read  Seen for followup: Elevated troponin I  Subjective:    Awake this morning, reports improved cough, still short of breath. No chest pain. Chest is congested.  Objective:   Temp:  [98 F (36.7 C)-101.5 F (38.6 C)] 100.6 F (38.1 C) (12/20 0600) Pulse Rate:  [84-131] 100 (12/20 0600) Resp:  [18-41] 22 (12/20 0600) BP: (114-156)/(53-85) 154/74 mmHg (12/20 0600) SpO2:  [86 %-100 %] 92 % (12/20 0750) FiO2 (%):  [40 %] 40 % (12/20 0400) Weight:  [100 lb 8.5 oz (45.6 kg)] 100 lb 8.5 oz (45.6 kg) (12/20 0600)    Filed Weights   05/07/15 2303 05/08/15 0129 05/09/15 0600  Weight: 95 lb (43.092 kg) 96 lb 12.5 oz (43.9 kg) 100 lb 8.5 oz (45.6 kg)    Intake/Output Summary (Last 24 hours) at 05/09/15 0756 Last data filed at 05/09/15 0600  Gross per 24 hour  Intake   2500 ml  Output   1000 ml  Net   1500 ml    Telemetry: Sinus rhythm and sinus tachycardia, bursts of SVT (some with aberrancy)  Exam:  General: Frail elderly woman, no distress.  Lungs: Diffuse crackles and rhonchi.  Cardiac: RRR without gallop.  Abdomen: NABS.  Extremities: No pitting edema.   Musculoskeletal: Significant kyphoscoliosis.  Lab Results:  Basic Metabolic Panel:  Recent Labs Lab 05/07/15 2111 05/08/15 0441  NA 138 139  K 5.4* 5.5*  CL 92* 99*  CO2 39* 35*  GLUCOSE 198* 113*  BUN 50* 51*  CREATININE 1.97* 1.81*  CALCIUM 9.8 9.2    Liver Function Tests:  Recent Labs Lab 05/08/15 0441  AST 27  ALT 10*  ALKPHOS 59  BILITOT 0.4  PROT 6.5  ALBUMIN 2.6*    CBC:  Recent Labs Lab 05/04/15 0715 05/07/15 2244 05/08/15 0441  WBC 5.1 17.9* 16.8*  HGB 7.8* 8.7* 7.8*  HCT 27.0* 29.2* 26.2*  MCV 95.7 95.4 94.6  PLT 157 179 150    Cardiac Enzymes:  Recent Labs Lab 05/08/15 05/08/15 0715 05/08/15 1328  TROPONINI 0.37* 0.26* 0.16*    Echocardiogram 05/08/2015: Study Conclusions  - Left ventricle: The cavity size  was normal. There was mild focal basal hypertrophy of the septum. Systolic function was vigorous. The estimated ejection fraction was in the range of 65% to 70%. Wall motion was normal; there were no regional wall motion abnormalities. Doppler parameters are consistent with abnormal left ventricular relaxation (grade 1 diastolic dysfunction). - Aortic valve: Mildly calcified annulus. Trileaflet. There was trivial regurgitation. - Mitral valve: Calcified annulus. There was trivial regurgitation. - Left atrium: The atrium was at the upper limits of normal in size. - Right atrium: Central venous pressure (est): 3 mm Hg. - Atrial septum: No defect or patent foramen ovale was identified. - Tricuspid valve: There was mild regurgitation. - Pulmonary arteries: Systolic pressure could not be accurately estimated. - Pericardium, extracardiac: There was no pericardial effusion.  Impressions:  - Mild basal septal hypertrophy with LVEF 65-70% and no significant focal wall motion abnormalities. Grade 1 diastolic dysfunction. Upper normal left atrial chamber size. MAC with trivial mitral regurgitation. Mildly sclerotic aortic valve with trivial aortic regurgitation. Mild tricuspid regurgitation.   Medications:   Scheduled Medications: . antiseptic oral rinse  7 mL Mouth Rinse BID  . aspirin EC  81 mg Oral Daily  . aztreonam  1 g Intravenous 3 times per day  . docusate sodium  100 mg Oral BID  .  enoxaparin (LOVENOX) injection  20 mg Subcutaneous Q24H  . escitalopram  10 mg Oral Daily  . feeding supplement (PRO-STAT SUGAR FREE 64)  30 mL Oral BID  . fentaNYL  50 mcg Transdermal Q72H  . ipratropium-albuterol  3 mL Nebulization Q6H  . iron polysaccharides  150 mg Oral Daily  . levETIRAcetam  250 mg Oral Q12H  . levothyroxine  175 mcg Oral QAC breakfast  . loratadine  10 mg Oral Daily  . methylPREDNISolone (SOLU-MEDROL) injection  40 mg Intravenous Q12H  . metoprolol  tartrate  25 mg Oral Daily  . rOPINIRole  4 mg Oral QHS  . senna-docusate  1 tablet Oral BID  . [START ON 05/10/2015] vancomycin  500 mg Intravenous Q48H     Infusions: . sodium chloride 75 mL/hr at 05/09/15 0600     PRN Medications:  acetaminophen, acetaminophen, albuterol, metoprolol, ondansetron **OR** ondansetron (ZOFRAN) IV, polyvinyl alcohol   Assessment:   1. Mildly elevated troponin I, peak only 0.37, likely indicative of demand ischemia in the face of hypoxic respiratory failure with suspected aspiration pneumonia. Patient almost certainly has some degree of underlying CAD at baseline. She does not report any active chest pain. ECG does show ischemic changes (deep anterolateral TWI). LVEF normal by echocardiogram without wall motion abnormalities.  2. Hypoxic respiratory failure with suspected aspiration pneumonia. Management per primary team and Dr. Juanetta Gosling. She is currently on Aztreonam.  3. Acute on chronic renal insufficiency, creatinine down to 1.8. Possible component of volume contraction. She is being hydrated.  4. Severe osteoporosis and vertebral compression fractures with chronic pain and limited mobility.  5. DNR status.   Plan/Discussion:    Discussed with Dr. Juanetta Gosling. Would continue conservative medical management. Currently on aspirin, Lovenox, and Lopressor. Will increase beta blocker dose. Hold off on ARB or ACE inhibitor with acute renal insufficiency. Empiric statin. No plan for further ischemic cardiac testing at this point.   Jonelle Sidle, M.D., F.A.C.C.

## 2015-05-10 DIAGNOSIS — G40909 Epilepsy, unspecified, not intractable, without status epilepticus: Secondary | ICD-10-CM

## 2015-05-10 DIAGNOSIS — L8992 Pressure ulcer of unspecified site, stage 2: Secondary | ICD-10-CM | POA: Diagnosis present

## 2015-05-10 LAB — BASIC METABOLIC PANEL
Anion gap: 6 (ref 5–15)
BUN: 33 mg/dL — ABNORMAL HIGH (ref 6–20)
CALCIUM: 8.3 mg/dL — AB (ref 8.9–10.3)
CO2: 28 mmol/L (ref 22–32)
CREATININE: 1.13 mg/dL — AB (ref 0.44–1.00)
Chloride: 108 mmol/L (ref 101–111)
GFR calc non Af Amer: 41 mL/min — ABNORMAL LOW (ref >60)
GFR, EST AFRICAN AMERICAN: 48 mL/min — AB (ref >60)
Glucose, Bld: 137 mg/dL — ABNORMAL HIGH (ref 65–99)
Potassium: 4.2 mmol/L (ref 3.5–5.1)
SODIUM: 142 mmol/L (ref 135–145)

## 2015-05-10 LAB — CBC WITH DIFFERENTIAL/PLATELET
BASOS PCT: 0 %
Basophils Absolute: 0 10*3/uL (ref 0.0–0.1)
EOS ABS: 0 10*3/uL (ref 0.0–0.7)
Eosinophils Relative: 0 %
HCT: 26.3 % — ABNORMAL LOW (ref 36.0–46.0)
HEMOGLOBIN: 7.9 g/dL — AB (ref 12.0–15.0)
Lymphocytes Relative: 4 %
Lymphs Abs: 0.3 10*3/uL — ABNORMAL LOW (ref 0.7–4.0)
MCH: 28.1 pg (ref 26.0–34.0)
MCHC: 30 g/dL (ref 30.0–36.0)
MCV: 93.6 fL (ref 78.0–100.0)
MONOS PCT: 2 %
Monocytes Absolute: 0.1 10*3/uL (ref 0.1–1.0)
NEUTROS PCT: 94 %
Neutro Abs: 5.7 10*3/uL (ref 1.7–7.7)
Platelets: 167 10*3/uL (ref 150–400)
RBC: 2.81 MIL/uL — ABNORMAL LOW (ref 3.87–5.11)
RDW: 16.4 % — AB (ref 11.5–15.5)
WBC: 6.1 10*3/uL (ref 4.0–10.5)

## 2015-05-10 NOTE — Progress Notes (Signed)
Subjective: She looks significantly better. No new complaints. She is more alert. She has been able to eat.  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 this is stage II She has a decubitus ulcer on her sacral area which is also stage II  Lab Results:  Results for orders placed or performed during the hospital encounter of 05/07/15 (from the past 48 hour(s))  Troponin I     Status: Abnormal   Collection Time: 05/08/15  1:28 PM  Result Value Ref Range   Troponin I 0.16 (H) <0.031 ng/mL    Comment:        PERSISTENTLY INCREASED TROPONIN VALUES IN THE RANGE OF 0.04-0.49 ng/mL CAN BE SEEN IN:       -UNSTABLE ANGINA       -CONGESTIVE HEART FAILURE       -MYOCARDITIS       -CHEST TRAUMA       -ARRYHTHMIAS       -LATE PRESENTING MYOCARDIAL INFARCTION       -COPD   CLINICAL FOLLOW-UP RECOMMENDED.   CBC with Differential/Platelet     Status: Abnormal   Collection Time: 05/09/15  7:40 AM  Result Value Ref Range   WBC 9.7 4.0 - 10.5 K/uL   RBC 2.78 (L) 3.87 - 5.11 MIL/uL   Hemoglobin 7.9 (L) 12.0 - 15.0 g/dL   HCT 26.0 (L) 36.0 - 46.0 %   MCV 93.5 78.0 - 100.0 fL   MCH 28.4 26.0 -  34.0 pg   MCHC 30.4 30.0 - 36.0 g/dL   RDW 16.4 (H) 11.5 - 15.5 %   Platelets 157 150 - 400 K/uL   Neutrophils Relative % 91 %   Neutro Abs 8.8 (H) 1.7 - 7.7 K/uL   Lymphocytes Relative 6 %   Lymphs Abs 0.5 (L) 0.7 - 4.0 K/uL   Monocytes Relative 3 %   Monocytes Absolute 0.3 0.1 - 1.0 K/uL   Eosinophils Relative 0 %   Eosinophils Absolute 0.0 0.0 - 0.7 K/uL   Basophils Relative 0 %   Basophils Absolute 0.0 0.0 - 0.1 K/uL  Basic metabolic panel     Status: Abnormal   Collection Time: 05/09/15  7:40 AM  Result Value Ref Range   Sodium 140 135 - 145 mmol/L   Potassium 4.6 3.5 - 5.1 mmol/L   Chloride 105 101 - 111 mmol/L   CO2 29 22 - 32 mmol/L   Glucose, Bld 85 65 - 99 mg/dL   BUN 37 (H) 6 - 20 mg/dL   Creatinine, Ser 1.21 (H) 0.44 - 1.00 mg/dL   Calcium 8.4 (L) 8.9 - 10.3 mg/dL   GFR calc non Af Amer 38 (L) >60 mL/min   GFR  calc Af Amer 44 (L) >60 mL/min    Comment: (NOTE) The eGFR has been calculated using the CKD EPI equation. This calculation has not been validated in all clinical situations. eGFR's persistently <60 mL/min signify possible Chronic Kidney Disease.    Anion gap 6 5 - 15  MRSA PCR Screening     Status: None   Collection Time: 05/09/15 11:05 AM  Result Value Ref Range   MRSA by PCR NEGATIVE NEGATIVE    Comment:        The GeneXpert MRSA Assay (FDA approved for NASAL specimens only), is one component of a comprehensive MRSA colonization surveillance program. It is not intended to diagnose MRSA infection nor to guide or monitor treatment for MRSA infections.   CBC with Differential/Platelet     Status: Abnormal   Collection Time: 05/10/15  4:23 AM  Result Value Ref Range   WBC 6.1 4.0 - 10.5 K/uL   RBC 2.81 (L) 3.87 - 5.11 MIL/uL   Hemoglobin 7.9 (L) 12.0 - 15.0 g/dL   HCT 26.3 (L) 36.0 - 46.0 %   MCV 93.6 78.0 - 100.0 fL   MCH 28.1 26.0 - 34.0 pg   MCHC 30.0 30.0 - 36.0 g/dL   RDW 16.4 (H) 11.5 - 15.5 %   Platelets 167 150 - 400 K/uL    Neutrophils Relative % 94 %   Neutro Abs 5.7 1.7 - 7.7 K/uL   Lymphocytes Relative 4 %   Lymphs Abs 0.3 (L) 0.7 - 4.0 K/uL   Monocytes Relative 2 %   Monocytes Absolute 0.1 0.1 - 1.0 K/uL   Eosinophils Relative 0 %   Eosinophils Absolute 0.0 0.0 - 0.7 K/uL   Basophils Relative 0 %   Basophils Absolute 0.0 0.0 - 0.1 K/uL  Basic metabolic panel     Status: Abnormal   Collection Time: 05/10/15  4:23 AM  Result Value Ref Range   Sodium 142 135 - 145 mmol/L   Potassium 4.2 3.5 - 5.1 mmol/L   Chloride 108 101 - 111 mmol/L   CO2 28 22 - 32 mmol/L   Glucose, Bld 137 (H) 65 - 99 mg/dL   BUN 33 (H) 6 - 20 mg/dL   Creatinine, Ser 1.13 (H) 0.44 - 1.00 mg/dL   Calcium 8.3 (L) 8.9 - 10.3 mg/dL   GFR calc non Af Amer 41 (L) >60 mL/min   GFR calc Af Amer 48 (L) >60 mL/min    Comment: (NOTE) The eGFR has been calculated using the CKD EPI equation. This calculation has not been validated in all clinical situations. eGFR's persistently <60 mL/min signify possible Chronic Kidney Disease.    Anion gap 6 5 - 15    ABGS No results for input(s): PHART, PO2ART, TCO2, HCO3 in the last 72 hours.  Invalid input(s): PCO2 CULTURES Recent Results (from the past 240 hour(s))  Urine culture     Status: None   Collection Time: 05/07/15  9:29 PM  Result Value Ref Range Status   Specimen Description URINE, CATHETERIZED  Final   Special Requests NONE  Final   Culture   Final    NO GROWTH 1 DAY Performed at [facility]    Report Status 05/09/2015 FINAL  Final  Culture, blood (routine x 2)     Status: None (Preliminary result)   Collection Time: 05/07/15 11:36 PM  Result Value Ref Range Status   Specimen Description BLOOD LEFT ANTECUBITAL  Final   Special Requests BOTTLES DRAWN AEROBIC AND  ANAEROBIC 6CC EACH  Final   Culture NO GROWTH 1 DAY  Final   Report Status PENDING  Incomplete  Culture, blood (routine x 2)     Status: None (Preliminary result)   Collection Time: 05/07/15 11:46 PM   Result Value Ref Range Status   Specimen Description BLOOD LEFT ANTECUBITAL  Final   Special Requests BOTTLES DRAWN AEROBIC AND ANAEROBIC 8CC EACH  Final   Culture NO GROWTH 1 DAY  Final   Report Status PENDING  Incomplete  MRSA PCR Screening     Status: None   Collection Time: 05/09/15 11:05 AM  Result Value Ref Range Status   MRSA by PCR NEGATIVE NEGATIVE Final    Comment:        The GeneXpert MRSA Assay (FDA approved for NASAL specimens only), is one component of a comprehensive MRSA colonization surveillance program. It is not intended to diagnose MRSA infection nor to guide or monitor treatment for MRSA infections.    Studies/Results: No results found.  Medications:  Prior to Admission:  Prescriptions prior to admission  Medication Sig Dispense Refill Last Dose  . Amino Acids-Protein Hydrolys (FEEDING SUPPLEMENT, PRO-STAT SUGAR FREE 64,) LIQD Take 30 mLs by mouth 2 (two) times daily.   05/07/2015  . calcium-vitamin D (OSCAL WITH D) 500-200 MG-UNIT per tablet Take 1 tablet by mouth 3 (three) times daily. For osteoporosis   05/07/2015  . Cranberry-Vitamin C-Probiotic (AZO CRANBERRY PO) Take 1 tablet by mouth 2 (two) times daily.   05/07/2015  . docusate sodium (COLACE) 100 MG capsule Take 100 mg by mouth 2 (two) times daily. For constipation   05/07/2015  . escitalopram (LEXAPRO) 10 MG tablet Take 10 mg by mouth daily. For depression   05/07/2015  . fentaNYL (DURAGESIC - DOSED MCG/HR) 50 MCG/HR Place 50 mcg onto the skin every other day.   Unknown  . furosemide (LASIX) 20 MG tablet Take 20 mg by mouth daily. For HTN/ edema   05/07/2015  . HYDROcodone-acetaminophen (NORCO/VICODIN) 5-325 MG per tablet Take one tablet by mouth three times daily and every 6 hours as needed for pain (Patient taking differently: Take 0.5 tablets by mouth 2 (two) times daily. ) 210 tablet 0 05/07/2015  . ipratropium-albuterol (DUONEB) 0.5-2.5 (3) MG/3ML SOLN Take 3 mLs by nebulization every 6 (six)  hours as needed (Shortness Of Breath).   Unknown  . iron polysaccharides (NIFEREX) 150 MG capsule Take 150 mg by mouth daily.   05/07/2015  . levETIRAcetam (KEPPRA) 250 MG tablet Take 250 mg by mouth every 12 (twelve) hours. For seizures   05/07/2015  . levothyroxine (SYNTHROID, LEVOTHROID) 175 MCG tablet Take 175 mcg by mouth daily. For thyroid therapy   05/07/2015  . loratadine (CLARITIN) 10 MG tablet Take 10 mg by mouth daily. For allergies   05/07/2015  . metoprolol tartrate (LOPRESSOR) 25 MG tablet Take 25 mg by mouth daily. For HTN   05/07/2015 at 1000  . Multiple Vitamin (MULTIVITAMIN WITH MINERALS) TABS Take 1 tablet by mouth daily. For nutritional supplement   05/07/2015  . ranitidine (ZANTAC) 150 MG tablet Take 150 mg by mouth at bedtime.   05/06/2015  . rOPINIRole (REQUIP) 4 MG tablet Take 4 mg by mouth at bedtime. For parkinson disease   05/06/2015  . sennosides-docusate sodium (SENOKOT-S) 8.6-50 MG tablet Take 1 tablet by mouth 2 (two) times daily. For constipation   05/07/2015  . alum & mag hydroxide-simeth (MYLANTA) 332-951-88 MG/5ML suspension Take 30 mLs by mouth every 4 (four)  hours as needed for indigestion or heartburn.   Unknown  . clindamycin (CLEOCIN) 300 MG capsule Take 1 capsule (300 mg total) by mouth 3 (three) times daily.   Taking  . hydroxypropyl methylcellulose / hypromellose (ISOPTO TEARS / GONIOVISC) 2.5 % ophthalmic solution Place 1 drop into the right eye as needed for dry eyes.   Unknown  . magnesium hydroxide (MILK OF MAGNESIA) 400 MG/5ML suspension Take 30 mLs by mouth daily as needed for mild constipation.   Unknown  . phenylephrine-shark liver oil-mineral oil-petrolatum (PREPARATION H) 0.25-3-14-71.9 % rectal ointment Place 1 application rectally as needed for hemorrhoids.   Unknown   Scheduled: . antiseptic oral rinse  7 mL Mouth Rinse BID  . aspirin EC  81 mg Oral Daily  . atorvastatin  20 mg Oral q1800  . aztreonam  1 g Intravenous 3 times per day  .  docusate sodium  100 mg Oral BID  . enoxaparin (LOVENOX) injection  30 mg Subcutaneous Q24H  . escitalopram  10 mg Oral Daily  . feeding supplement (PRO-STAT SUGAR FREE 64)  30 mL Oral BID  . fentaNYL  50 mcg Transdermal Q72H  . ipratropium-albuterol  3 mL Nebulization Q6H  . iron polysaccharides  150 mg Oral Daily  . levETIRAcetam  250 mg Oral Q12H  . levothyroxine  175 mcg Oral QAC breakfast  . loratadine  10 mg Oral Daily  . methylPREDNISolone (SOLU-MEDROL) injection  40 mg Intravenous Q12H  . metoprolol tartrate  25 mg Oral TID  . rOPINIRole  4 mg Oral QHS  . senna-docusate  1 tablet Oral BID  . vancomycin  500 mg Intravenous Q48H   Continuous: . sodium chloride 75 mL/hr at 05/09/15 1900   EBR:[id number], acetaminophen, albuterol, metoprolol, ondansetron **OR** ondansetron (ZOFRAN) IV, polyvinyl alcohol  Assesment: She was admitted with healthcare associated pneumonia likely from aspiration. She is improving. She had acute on chronic hypoxic respiratory failure initially requiring BiPAP but now on nasal oxygen.  She has chronic aspiration syndrome at baseline  She has seizure disorder but no seizures have been seen and she did not have a documented seizure prior to her pneumonia  She has chronic ulceration on her sacrum and her ankle  She had elevated troponin and had abnormal electrocardiogram and this is being treated conservatively  She has chronic anemia which is stable Active Problems:   HCAP (healthcare-associated pneumonia)   Acute on chronic respiratory failure with hypoxia (HCC)   Anemia   Elevated troponin   Pressure ulcer   Pressure ulcer stage II    Plan: Transfer to floor    LOS: 3 days   Sebastain Fishbaugh L 05/10/2015, 7:58 AM

## 2015-05-10 NOTE — Care Management Note (Signed)
Case Management Note  Patient Details  Name: Harrell GaveLillian C Fausto MRN: [id number] Date of Birth: [birth year]  Subjective/Objective:                  Pt admitted for HCAP. [facility] CSW has been referred.   Action/Plan: Anticipate pt will return to SNF. CSW will arrange for return to facility when appropriate. No CM needs anticipated.   Expected Discharge Date:  05/11/15               Expected Discharge Plan:  Skilled Nursing Facility  In-House Referral:  Clinical Social Work  Discharge planning Services  CM Consult  Post Acute Care Choice:  NA Choice offered to:  NA  DME Arranged:    DME Agency:     HH Arranged:    HH Agency:     Status of Service:  Completed, signed off  Medicare Important Message Given:    Date Medicare IM Given:    Medicare IM give by:    Date Additional Medicare IM Given:    Additional Medicare Important Message give by:     If discussed at Long Length of Stay Meetings, dates discussed:    Additional Comments:  Malcolm MetroChildress, Dustine Stickler Demske, RN 05/10/2015, 11:24 AM

## 2015-05-10 NOTE — NC FL2 (Signed)
[place] MEDICAID FL2 LEVEL OF CARE SCREENING TOOL     IDENTIFICATION  Patient Name: [name] Birthdate: 1922/10/10 Sex: female Admission Date (Current Location): 05/07/2015  [place] and [place] Number: Aaron Edelman  [id number] P Facility and Address:  [facility],  [address] [place] [address]      Provider Number: [id number]  Attending Physician Name and Address:  Kari Baars, MD  Relative Name and Phone Number:       Current Level of Care: Hospital Recommended Level of Care: Skilled Nursing Facility Prior Approval Number:    Date Approved/Denied:   PASRR Number: [id number]  Discharge Plan: SNF    Current Diagnoses: Patient Active Problem List   Diagnosis Date Noted  . Pressure ulcer stage II 05/10/2015  . Seizure disorder (HCC) 05/10/2015  . Elevated troponin 05/08/2015  . Pressure ulcer 05/08/2015  . Anemia 04/27/2015  . Protein-calorie malnutrition, severe (HCC) 09/13/2014  . Healthcare-associated pneumonia 09/10/2014  . Essential hypertension 09/10/2014  . Insomnia 09/10/2014  . Protein calorie malnutrition (HCC) 09/10/2014  . Stasis leg ulcer (HCC) 08/15/2014  . COPD exacerbation (HCC) 08/12/2014  . Acute on chronic respiratory failure with hypercapnia (HCC) 08/12/2014  . Acute on chronic respiratory failure with hypoxia (HCC) 08/12/2014  . Convulsions/seizures (HCC) 03/30/2014  . Hypothyroidism 03/30/2014  . Osteoporosis 03/30/2014  . Depression 03/30/2014  . Hospital acquired PNA 06/09/2013  . HCAP (healthcare-associated pneumonia) 06/09/2013    Orientation RESPIRATION BLADDER Height & Weight    Self  O2 (2L) Indwelling catheter  (149.9 cm) 96 lbs.  BEHAVIORAL SYMPTOMS/MOOD NEUROLOGICAL BOWEL NUTRITION STATUS  Other (Comment) (n/a) Convulsions/Seizures (history) Incontinent Diet (Dysphagia 2 with nectar thick liquids)  AMBULATORY STATUS COMMUNICATION OF NEEDS Skin   Total Care Verbally Bruising, Other (Comment),  PU Stage and Appropriate Care (Moisture associated skin damage) PU Stage 1 Dressing: Daily (Left heel with foam dressing) PU Stage 2 Dressing: Daily (Sacrum with foam dressing)                   Personal Care Assistance Level of Assistance  Bathing, Feeding, Dressing Bathing Assistance: Maximum assistance Feeding assistance: Maximum assistance Dressing Assistance: Maximum assistance     Functional Limitations Info  Sight, Hearing, Speech Sight Info: Impaired Hearing Info: Adequate Speech Info: Adequate    SPECIAL CARE FACTORS FREQUENCY                       Contractures      Additional Factors Info  Code Status, Allergies Code Status Info: DNR Allergies Info: Aricept, Bactrim, Ciprofloxacin, Darvocet, Namenda, Penicillins, Vibramycin           Current Medications (05/10/2015):  This is the current hospital active medication list Current Facility-Administered Medications  Medication Dose Route Frequency Provider Last Rate Last Dose  . 0.9 %  sodium chloride infusion   Intravenous Continuous Samuel Jester, DO 75 mL/hr at 05/10/15 0800    . acetaminophen (TYLENOL) suppository 650 mg  650 mg Rectal Q4H PRN Kari Baars, MD   650 mg at 05/09/15 0013  . acetaminophen (TYLENOL) tablet 650 mg  650 mg Oral Q6H PRN Kari Baars, MD      . albuterol (PROVENTIL) (2.5 MG/3ML) 0.083% nebulizer solution 2.5 mg  2.5 mg Nebulization Q2H PRN Kari Baars, MD      . antiseptic oral rinse (CPC / CETYLPYRIDINIUM CHLORIDE 0.05%) solution 7 mL  7 mL Mouth Rinse BID Meredeth Ide, MD   7 mL at 05/09/15  2200  . aspirin EC tablet 81 mg  81 mg Oral Daily Meredeth IdeGagan S Lama, MD   81 mg at 05/09/15 0900  . atorvastatin (LIPITOR) tablet 20 mg  20 mg Oral q1800 Jonelle SidleSamuel G McDowell, MD   20 mg at 05/09/15 1711  . aztreonam (AZACTAM) 1 g in dextrose 5 % 50 mL IVPB  1 g Intravenous 3 times per day Kari BaarsEdward Hawkins, MD   1 g at 05/10/15 0422  . docusate sodium (COLACE) capsule 100 mg  100 mg Oral  BID Meredeth IdeGagan S Lama, MD   100 mg at 05/09/15 2200  . enoxaparin (LOVENOX) injection 30 mg  30 mg Subcutaneous Q24H Kari BaarsEdward Hawkins, MD      . escitalopram (LEXAPRO) tablet 10 mg  10 mg Oral Daily Meredeth IdeGagan S Lama, MD   10 mg at 05/09/15 0900  . feeding supplement (PRO-STAT SUGAR FREE 64) liquid 30 mL  30 mL Oral BID Meredeth IdeGagan S Lama, MD   30 mL at 05/09/15 2258  . fentaNYL (DURAGESIC - dosed mcg/hr) 50 mcg  50 mcg Transdermal Q72H Meredeth IdeGagan S Lama, MD   50 mcg at 05/08/15 1051  . ipratropium-albuterol (DUONEB) 0.5-2.5 (3) MG/3ML nebulizer solution 3 mL  3 mL Nebulization Q6H Kari BaarsEdward Hawkins, MD   3 mL at 05/10/15 0805  . iron polysaccharides (NIFEREX) capsule 150 mg  150 mg Oral Daily Meredeth IdeGagan S Lama, MD   150 mg at 05/09/15 0900  . levETIRAcetam (KEPPRA) tablet 250 mg  250 mg Oral Q12H Meredeth IdeGagan S Lama, MD   250 mg at 05/09/15 2255  . levothyroxine (SYNTHROID, LEVOTHROID) tablet 175 mcg  175 mcg Oral QAC breakfast Meredeth IdeGagan S Lama, MD   175 mcg at 05/10/15 0758  . loratadine (CLARITIN) tablet 10 mg  10 mg Oral Daily Meredeth IdeGagan S Lama, MD   10 mg at 05/09/15 0900  . methylPREDNISolone sodium succinate (SOLU-MEDROL) 40 mg/mL injection 40 mg  40 mg Intravenous Q12H Kari BaarsEdward Hawkins, MD   40 mg at 05/10/15 0758  . metoprolol (LOPRESSOR) injection 5 mg  5 mg Intravenous Q4H PRN Wilson SingerNimish C Gosrani, MD   5 mg at 05/09/15 0317  . metoprolol tartrate (LOPRESSOR) tablet 25 mg  25 mg Oral TID Jonelle SidleSamuel G McDowell, MD   25 mg at 05/09/15 2255  . ondansetron (ZOFRAN) tablet 4 mg  4 mg Oral Q6H PRN Meredeth IdeGagan S Lama, MD       Or  . ondansetron (ZOFRAN) injection 4 mg  4 mg Intravenous Q6H PRN Meredeth IdeGagan S Lama, MD      . polyvinyl alcohol (LIQUIFILM TEARS) 1.4 % ophthalmic solution 1 drop  1 drop Right Eye PRN Kari BaarsEdward Hawkins, MD      . rOPINIRole (REQUIP) tablet 4 mg  4 mg Oral QHS Meredeth IdeGagan S Lama, MD   4 mg at 05/09/15 2255  . senna-docusate (Senokot-S) tablet 1 tablet  1 tablet Oral BID Meredeth IdeGagan S Lama, MD   1 tablet at 05/09/15 2200  . vancomycin (VANCOCIN) 500  mg in sodium chloride 0.9 % 100 mL IVPB  500 mg Intravenous Q48H Kari BaarsEdward Hawkins, MD   500 mg at 05/10/15 0105     Discharge Medications: Please see discharge summary for a list of discharge medications.  Relevant Imaging Results:  Relevant Lab Results:   Additional Information    Karn CassisStultz, Eldred Lievanos [place], [place] [phone]

## 2015-05-11 ENCOUNTER — Inpatient Hospital Stay
Admission: RE | Admit: 2015-05-11 | Discharge: 2015-05-14 | Disposition: A | Discharge status: Other Acute Inpatient Hospital | Payer: Medicare Other | Source: Ambulatory Visit | Attending: Internal Medicine | Admitting: Internal Medicine

## 2015-05-11 MED ORDER — CLINDAMYCIN HCL 300 MG PO CAPS: 300.0000 mg | ORAL_CAPSULE | Freq: Four times a day (QID) | ORAL | Status: DC

## 2015-05-11 MED ORDER — HYDROCODONE-ACETAMINOPHEN 5-325 MG PO TABS: 0.5000 | ORAL_TABLET | Freq: Two times a day (BID) | ORAL | Status: AC

## 2015-05-11 MED ORDER — LEVETIRACETAM 100 MG/ML PO SOLN
250.0000 mg | Freq: Two times a day (BID) | ORAL | Status: DC
  Administered 2015-05-11: 250 mg via ORAL
  Filled 2015-05-11 (×3): qty 2.5

## 2015-05-11 MED ORDER — PREDNISONE 10 MG PO TABS: 10.0000 mg | ORAL_TABLET | Freq: Every day | ORAL | Status: AC

## 2015-05-11 NOTE — Clinical Social Work Note (Signed)
[facility] Daughter, Cardell PeachGay and facility aware and agreeable. Will transfer with staff.   Derenda FennelKara Gwenna Fuston, LCSW [phone]

## 2015-05-11 NOTE — Plan of Care (Signed)
Problem: Discharge Progression Outcomes Goal: Other Discharge Outcomes/Goals Outcome: Completed/Met Date Met:  05/11/15 Trnsfred to [place]

## 2015-05-11 NOTE — Discharge Summary (Signed)
Physician Discharge Summary  Patient ID: [name] MRN: [id number] DOB/AGE: 1923/04/04 81 y.o. Primary Care Physician:Logann Whitebread L, MD Admit date: 05/07/2015 Discharge date: 05/11/2015    Discharge Diagnoses:   Active Problems:   HCAP (healthcare-associated pneumonia)   Acute on chronic respiratory failure with hypoxia (HCC)   Protein-calorie malnutrition, severe (HCC)   Anemia   Elevated troponin   Pressure ulcer   Pressure ulcer stage II   Seizure disorder (HCC)     Medication List    TAKE these medications        AZO CRANBERRY PO  Take 1 tablet by mouth 2 (two) times daily.     calcium-vitamin D 500-200 MG-UNIT tablet  Commonly known as:  OSCAL WITH D  Take 1 tablet by mouth 3 (three) times daily. For osteoporosis     clindamycin 300 MG capsule  Commonly known as:  CLEOCIN  Take 1 capsule (300 mg total) by mouth 4 (four) times daily.     docusate sodium 100 MG capsule  Commonly known as:  COLACE  Take 100 mg by mouth 2 (two) times daily. For constipation     escitalopram 10 MG tablet  Commonly known as:  LEXAPRO  Take 10 mg by mouth daily. For depression     feeding supplement (PRO-STAT SUGAR FREE 64) Liqd  Take 30 mLs by mouth 2 (two) times daily.     fentaNYL 50 MCG/HR  Commonly known as:  DURAGESIC - dosed mcg/hr  Place 50 mcg onto the skin every other day.     furosemide 20 MG tablet  Commonly known as:  LASIX  Take 20 mg by mouth daily. For HTN/ edema     HYDROcodone-acetaminophen 5-325 MG tablet  Commonly known as:  NORCO/VICODIN  Take 0.5 tablets by mouth 2 (two) times daily.     hydroxypropyl methylcellulose / hypromellose 2.5 % ophthalmic solution  Commonly known as:  ISOPTO TEARS / GONIOVISC  Place 1 drop into the right eye as needed for dry eyes.     ipratropium-albuterol 0.5-2.5 (3) MG/3ML Soln  Commonly known as:  DUONEB  Take 3 mLs by nebulization every 6 (six) hours as needed (Shortness Of Breath).     iron  polysaccharides 150 MG capsule  Commonly known as:  NIFEREX  Take 150 mg by mouth daily.     levETIRAcetam 250 MG tablet  Commonly known as:  KEPPRA  Take 250 mg by mouth every 12 (twelve) hours. For seizures     levothyroxine 175 MCG tablet  Commonly known as:  SYNTHROID, LEVOTHROID  Take 175 mcg by mouth daily. For thyroid therapy     loratadine 10 MG tablet  Commonly known as:  CLARITIN  Take 10 mg by mouth daily. For allergies     magnesium hydroxide 400 MG/5ML suspension  Commonly known as:  MILK OF MAGNESIA  Take 30 mLs by mouth daily as needed for mild constipation.     metoprolol tartrate 25 MG tablet  Commonly known as:  LOPRESSOR  Take 25 mg by mouth daily. For HTN     multivitamin with minerals Tabs tablet  Take 1 tablet by mouth daily. For nutritional supplement     MYLANTA 200-200-20 MG/5ML suspension  Generic drug:  alum & mag hydroxide-simeth  Take 30 mLs by mouth every 4 (four) hours as needed for indigestion or heartburn.     predniSONE 10 MG tablet  Commonly known as:  DELTASONE  Take 1 tablet (10 mg total) by mouth daily  with breakfast. Take 40 mg daily 3 days 303 days 203 days 103 days     PREPARATION H 0.25-3-14-71.9 % rectal ointment  Generic drug:  phenylephrine-shark liver oil-mineral oil-petrolatum  Place 1 application rectally as needed for hemorrhoids.     ranitidine 150 MG tablet  Commonly known as:  ZANTAC  Take 150 mg by mouth at bedtime.     rOPINIRole 4 MG tablet  Commonly known as:  REQUIP  Take 4 mg by mouth at bedtime. For parkinson disease     sennosides-docusate sodium 8.6-50 MG tablet  Commonly known as:  SENOKOT-S  Take 1 tablet by mouth 2 (two) times daily. For constipation        Discharged Condition: Improved    Consults: None  Significant Diagnostic Studies: Dg Chest 1 View  04/18/2015  CLINICAL DATA:  Cough, congestion EXAM: CHEST 1 VIEW COMPARISON:  Chest x-ray of 03/24/2015 FINDINGS: The current film was  somewhat rotated. However the vague opacity in the right mid lung and right lung base noted on the prior chest x-ray is not definitely seen and may have represented pneumonia which has cleared. The lungs are not well aerated. The heart is enlarged and stable. A large hiatal hernia again is noted. The bones are osteopenic. IMPRESSION: 1. No definite pneumonia or effusion. 2. Very rotated film. 3. Stable cardiomegaly and large hiatal hernia. Electronically Signed   By: Dwyane DeePaul  Barry M.D.   On: 04/18/2015 15:24   Dg Chest 2 View  05/07/2015  CLINICAL DATA:  Shortness of Breath, cough and congestion EXAM: CHEST - 2 VIEW COMPARISON:  04/18/2015 FINDINGS: Large hiatal hernia is again seen. The cardiac shadow is stable. Multilevel vertebral augmentation is seen. Increasing infiltrate is noted in the bases bilaterally slightly worse on the right than the left. No other focal abnormality is seen. IMPRESSION: Increasing bibasilar infiltrates. Electronically Signed   By: Alcide CleverMark  Lukens M.D.   On: 05/07/2015 15:21   Dg Chest Port 1 View  05/07/2015  CLINICAL DATA:  I initial evaluation for acute shortness of breath. EXAM: PORTABLE CHEST 1 VIEW COMPARISON:  Prior study from 05/07/2015. FINDINGS: Patient is markedly rotated to the left. Moderate cardiomegaly is similar to previous. Mediastinal silhouette within normal limits. Prominent atheromatous plaque within the aortic arch. Large hiatal hernia again noted. Lungs are mildly hypoinflated. There are bibasilar are irregular parenchymal opacities, which may reflect atelectasis, infiltrates, or possibly aspiration. Small right pleural effusion is suspected. Vascular congestion without frank pulmonary edema. No pneumothorax. Osseous structures unchanged. Sequelae of prior vertebral augmentation noted within the thoracolumbar spine. IMPRESSION: 1. Patchy bibasilar airspace opacities, which may reflect atelectasis or sequela of infection or aspiration pneumonitis. 2. Stable  cardiomegaly with diffuse pulmonary vascular congestion without overt pulmonary edema. 3. Suspect small right pleural effusion. 4. Large hiatal hernia. Electronically Signed   By: Rise MuBenjamin  McClintock M.D.   On: 05/07/2015 22:16    Lab Results: Basic Metabolic Panel:  Recent Labs  16/02/9611/20/16 0740 05/10/15 0423  NA 140 142  K 4.6 4.2  CL 105 108  CO2 29 28  GLUCOSE 85 137*  BUN 37* 33*  CREATININE 1.21* 1.13*  CALCIUM 8.4* 8.3*   Liver Function Tests: No results for input(s): AST, ALT, ALKPHOS, BILITOT, PROT, ALBUMIN in the last 72 hours.   CBC:  Recent Labs  05/09/15 0740 05/10/15 0423  WBC 9.7 6.1  NEUTROABS 8.8* 5.7  HGB 7.9* 7.9*  HCT 26.0* 26.3*  MCV 93.5 93.6  PLT 157 167  Recent Results (from the past 240 hour(s))  Urine culture     Status: None   Collection Time: 05/07/15  9:29 PM  Result Value Ref Range Status   Specimen Description URINE, CATHETERIZED  Final   Special Requests NONE  Final   Culture   Final    NO GROWTH 1 DAY Performed at [facility]    Report Status 05/09/2015 FINAL  Final  Culture, blood (routine x 2)     Status: None (Preliminary result)   Collection Time: 05/07/15 11:36 PM  Result Value Ref Range Status   Specimen Description BLOOD LEFT ANTECUBITAL  Final   Special Requests BOTTLES DRAWN AEROBIC AND ANAEROBIC 6CC EACH  Final   Culture NO GROWTH 2 DAYS  Final   Report Status PENDING  Incomplete  Culture, blood (routine x 2)     Status: None (Preliminary result)   Collection Time: 05/07/15 11:46 PM  Result Value Ref Range Status   Specimen Description BLOOD LEFT ANTECUBITAL  Final   Special Requests BOTTLES DRAWN AEROBIC AND ANAEROBIC 8CC EACH  Final   Culture NO GROWTH 2 DAYS  Final   Report Status PENDING  Incomplete  MRSA PCR Screening     Status: None   Collection Time: 05/09/15 11:05 AM  Result Value Ref Range Status   MRSA by PCR NEGATIVE NEGATIVE Final    Comment:        The GeneXpert MRSA Assay  (FDA approved for NASAL specimens only), is one component of a comprehensive MRSA colonization surveillance program. It is not intended to diagnose MRSA infection nor to guide or monitor treatment for MRSA infections.      Hospital Course: This is a 81 year old who has been a long-term resident of a skilled care facility. She had been in her usual state of poor health when she developed increasing shortness of breath and became poorly responsive. She was brought to the emergency department and was found to have pneumonia. This appeared to be from aspiration. She has multiple bouts of aspiration. She was treated with intravenous antibiotics and improved. She briefly required BiPAP. She is back at baseline as far as her breathing is concerned and had some confusion which is thought to be from being out of her normal environment. I discussed with her family and were going to send her back to the nursing home to finish her course of antibiotics which can be done by mouth now. I think this will help her mental status.  Discharge Exam: Blood pressure 166/69, pulse 99, temperature 97.9 F (36.6 C), temperature source Oral, resp. rate 17, height  (1.499 m), weight 45.6 kg (100 lb 8.5 oz), SpO2 93 %. She is awake and alert and mildly confused. Her chest is clearer than on admission but still has some rhonchi.  Disposition: Return to skilled care facility. She will be on oxygen. This will be at 2 L/m. She will require oral clindamycin 300 mg 4 times a day for 10 days. She will have chest x-ray after 10 days to evaluate clearing of her pneumonia. She will be on prednisone in a tapering dose. She will continue with her other treatments. She will be on dysphagia diet. She will have thickened fluids. She has DO NOT RESUSCITATE status she will be on scheduled nebulizer treatments 4 times a day for the next 10 days.      Discharge Instructions    Discharge to SNF when bed available    Complete by:  As  directed              Signed: Thayer Inabinet L   05/11/2015, 9:37 AM

## 2015-05-11 NOTE — Progress Notes (Signed)
She is more confused today. I think it's because she is out of her normal environment. She looks pretty much back to baseline otherwise. Her chest is clearing. I think we should get her back to her skilled care facility and it will help her mental status. I will plan to discharge today. Please see discharge summary for details

## 2015-05-11 NOTE — Care Management Important Message (Signed)
Important Message  Patient Details  Name: [name] MRN: [id number] Date of Birth: 12/15/1922   Medicare Important Message Given:  Yes    Cheryl FlashBlackwell, Shrika Milos Crowder, RN 05/11/2015, 9:14 AM

## 2015-05-11 NOTE — Progress Notes (Signed)
Transferred via wheelchair to [facility]

## 2015-05-11 NOTE — Care Management Note (Signed)
Case Management Note  Patient Details  Name: [name] MRN: [id number] Date of Birth: 28-Mar-1923  Subjective/Objective:                    Action/Plan:   Expected Discharge Date:  05/11/15               Expected Discharge Plan:  Skilled Nursing Facility  In-House Referral:  Clinical Social Work  Discharge planning Services  CM Consult  Post Acute Care Choice:  NA Choice offered to:  NA  DME Arranged:    DME Agency:     HH Arranged:    HH Agency:     Status of Service:  Completed, signed off  Medicare Important Message Given:    Date Medicare IM Given:    Medicare IM give by:    Date Additional Medicare IM Given:    Additional Medicare Important Message give by:     If discussed at Long Length of Stay Meetings, dates discussed:    Additional Comments: [facility]. No Cm needs noted. CSw to arrange discharge to facility. Arlyss QueenBlackwell, [name] Keeler Columbusrowder, RN 05/11/2015, 9:14 AM

## 2015-05-13 LAB — CULTURE, BLOOD (ROUTINE X 2)
CULTURE: NO GROWTH
Culture: NO GROWTH

## 2015-05-14 ENCOUNTER — Encounter (HOSPITAL_COMMUNITY): Payer: Self-pay | Admitting: Emergency Medicine

## 2015-05-14 ENCOUNTER — Emergency Department (HOSPITAL_COMMUNITY): Payer: Medicare Other

## 2015-05-14 ENCOUNTER — Inpatient Hospital Stay (HOSPITAL_COMMUNITY)
Admission: EM | Admit: 2015-05-14 | Discharge: 2015-05-18 | DRG: 177 | Disposition: A | Discharge status: Skilled Nursing Facility | Payer: Medicare Other | Attending: Pulmonary Disease | Admitting: Pulmonary Disease

## 2015-05-14 DIAGNOSIS — Z7189 Other specified counseling: Secondary | ICD-10-CM | POA: Insufficient documentation

## 2015-05-14 DIAGNOSIS — E039 Hypothyroidism, unspecified: Secondary | ICD-10-CM | POA: Diagnosis present

## 2015-05-14 DIAGNOSIS — Z9181 History of falling: Secondary | ICD-10-CM

## 2015-05-14 DIAGNOSIS — J9621 Acute and chronic respiratory failure with hypoxia: Secondary | ICD-10-CM | POA: Diagnosis present

## 2015-05-14 DIAGNOSIS — J690 Pneumonitis due to inhalation of food and vomit: Secondary | ICD-10-CM | POA: Insufficient documentation

## 2015-05-14 DIAGNOSIS — Z792 Long term (current) use of antibiotics: Secondary | ICD-10-CM

## 2015-05-14 DIAGNOSIS — J441 Chronic obstructive pulmonary disease with (acute) exacerbation: Secondary | ICD-10-CM | POA: Diagnosis present

## 2015-05-14 DIAGNOSIS — Z515 Encounter for palliative care: Secondary | ICD-10-CM | POA: Diagnosis not present

## 2015-05-14 DIAGNOSIS — E43 Unspecified severe protein-calorie malnutrition: Secondary | ICD-10-CM | POA: Diagnosis present

## 2015-05-14 DIAGNOSIS — K219 Gastro-esophageal reflux disease without esophagitis: Secondary | ICD-10-CM | POA: Diagnosis present

## 2015-05-14 DIAGNOSIS — M810 Age-related osteoporosis without current pathological fracture: Secondary | ICD-10-CM | POA: Diagnosis present

## 2015-05-14 DIAGNOSIS — Z8522 Personal history of malignant neoplasm of nasal cavities, middle ear, and accessory sinuses: Secondary | ICD-10-CM

## 2015-05-14 DIAGNOSIS — G40909 Epilepsy, unspecified, not intractable, without status epilepticus: Secondary | ICD-10-CM | POA: Diagnosis present

## 2015-05-14 DIAGNOSIS — J189 Pneumonia, unspecified organism: Secondary | ICD-10-CM | POA: Diagnosis present

## 2015-05-14 DIAGNOSIS — J440 Chronic obstructive pulmonary disease with acute lower respiratory infection: Secondary | ICD-10-CM | POA: Diagnosis present

## 2015-05-14 DIAGNOSIS — R0602 Shortness of breath: Secondary | ICD-10-CM | POA: Diagnosis present

## 2015-05-14 DIAGNOSIS — Z66 Do not resuscitate: Secondary | ICD-10-CM | POA: Diagnosis present

## 2015-05-14 LAB — CBC WITH DIFFERENTIAL/PLATELET
Basophils Absolute: 0 10*3/uL (ref 0.0–0.1)
Basophils Relative: 0 %
EOS ABS: 0 10*3/uL (ref 0.0–0.7)
EOS PCT: 0 %
HCT: 30.2 % — ABNORMAL LOW (ref 36.0–46.0)
HEMOGLOBIN: 9 g/dL — AB (ref 12.0–15.0)
LYMPHS ABS: 0.5 10*3/uL — AB (ref 0.7–4.0)
LYMPHS PCT: 3 %
MCH: 28 pg (ref 26.0–34.0)
MCHC: 29.8 g/dL — AB (ref 30.0–36.0)
MCV: 94.1 fL (ref 78.0–100.0)
MONO ABS: 0.4 10*3/uL (ref 0.1–1.0)
MONOS PCT: 2 %
Neutro Abs: 17.9 10*3/uL — ABNORMAL HIGH (ref 1.7–7.7)
Neutrophils Relative %: 95 %
PLATELETS: 247 10*3/uL (ref 150–400)
RBC: 3.21 MIL/uL — AB (ref 3.87–5.11)
RDW: 16.5 % — AB (ref 11.5–15.5)
WBC: 18.8 10*3/uL — AB (ref 4.0–10.5)

## 2015-05-14 LAB — BASIC METABOLIC PANEL
Anion gap: 7 (ref 5–15)
BUN: 34 mg/dL — AB (ref 6–20)
CALCIUM: 9.8 mg/dL (ref 8.9–10.3)
CHLORIDE: 101 mmol/L (ref 101–111)
CO2: 33 mmol/L — ABNORMAL HIGH (ref 22–32)
CREATININE: 1.37 mg/dL — AB (ref 0.44–1.00)
GFR calc Af Amer: 38 mL/min — ABNORMAL LOW (ref >60)
GFR calc non Af Amer: 33 mL/min — ABNORMAL LOW (ref >60)
Glucose, Bld: 139 mg/dL — ABNORMAL HIGH (ref 65–99)
Potassium: 3.7 mmol/L (ref 3.5–5.1)
SODIUM: 141 mmol/L (ref 135–145)

## 2015-05-14 LAB — TROPONIN I: TROPONIN I: 0.06 ng/mL — AB (ref <0.031)

## 2015-05-14 LAB — BLOOD GAS, ARTERIAL
Acid-Base Excess: 6.6 mmol/L — ABNORMAL HIGH (ref 0.0–2.0)
BICARBONATE: 30 meq/L — AB (ref 20.0–24.0)
DRAWN BY: 234301
O2 Content: 5 L/min
O2 Saturation: 95.4 %
PCO2 ART: 55 mmHg — AB (ref 35.0–45.0)
PH ART: 7.378 (ref 7.350–7.450)
pO2, Arterial: 80.1 mmHg (ref 80.0–100.0)

## 2015-05-14 LAB — I-STAT CG4 LACTIC ACID, ED: Lactic Acid, Venous: 1.62 mmol/L (ref 0.5–2.0)

## 2015-05-14 LAB — BRAIN NATRIURETIC PEPTIDE: B Natriuretic Peptide: 394 pg/mL — ABNORMAL HIGH (ref 0.0–100.0)

## 2015-05-14 LAB — TSH: TSH: 3.47 u[IU]/mL (ref 0.350–4.500)

## 2015-05-14 MED ORDER — ESCITALOPRAM OXALATE 10 MG PO TABS
10.0000 mg | ORAL_TABLET | Freq: Every day | ORAL | Status: DC
  Administered 2015-05-14 – 2015-05-18 (×5): 10 mg via ORAL
  Filled 2015-05-14 (×4): qty 1

## 2015-05-14 MED ORDER — METOPROLOL TARTRATE 25 MG PO TABS
25.0000 mg | ORAL_TABLET | Freq: Every day | ORAL | Status: DC
  Administered 2015-05-14 – 2015-05-18 (×5): 25 mg via ORAL
  Filled 2015-05-14 (×4): qty 1

## 2015-05-14 MED ORDER — ALBUTEROL SULFATE (2.5 MG/3ML) 0.083% IN NEBU
2.5000 mg | INHALATION_SOLUTION | Freq: Four times a day (QID) | RESPIRATORY_TRACT | Status: DC
  Administered 2015-05-14 (×2): 2.5 mg via RESPIRATORY_TRACT
  Filled 2015-05-14 (×2): qty 3

## 2015-05-14 MED ORDER — LEVOTHYROXINE SODIUM 75 MCG PO TABS
175.0000 ug | ORAL_TABLET | Freq: Every day | ORAL | Status: DC
  Administered 2015-05-14 – 2015-05-18 (×5): 175 ug via ORAL
  Filled 2015-05-14 (×4): qty 1

## 2015-05-14 MED ORDER — IPRATROPIUM-ALBUTEROL 0.5-2.5 (3) MG/3ML IN SOLN: 3.0000 mL | Freq: Four times a day (QID) | RESPIRATORY_TRACT | Status: DC | PRN

## 2015-05-14 MED ORDER — PHENYLEPH-SHARK LIV OIL-MO-PET 0.25-3-14-71.9 % RE OINT
1.0000 "application " | TOPICAL_OINTMENT | RECTAL | Status: DC | PRN
  Filled 2015-05-14: qty 28.4

## 2015-05-14 MED ORDER — ALBUTEROL SULFATE (2.5 MG/3ML) 0.083% IN NEBU
2.5000 mg | INHALATION_SOLUTION | RESPIRATORY_TRACT | Status: DC | PRN
  Administered 2015-05-17: 2.5 mg via RESPIRATORY_TRACT
  Filled 2015-05-14: qty 3

## 2015-05-14 MED ORDER — VANCOMYCIN HCL 500 MG IV SOLR
500.0000 mg | INTRAVENOUS | Status: DC
  Filled 2015-05-14: qty 500

## 2015-05-14 MED ORDER — ADULT MULTIVITAMIN W/MINERALS CH
1.0000 | ORAL_TABLET | Freq: Every day | ORAL | Status: DC
  Administered 2015-05-15 – 2015-05-18 (×4): 1 via ORAL
  Filled 2015-05-14 (×4): qty 1

## 2015-05-14 MED ORDER — DOCUSATE SODIUM 100 MG PO CAPS
100.0000 mg | ORAL_CAPSULE | Freq: Two times a day (BID) | ORAL | Status: DC
  Administered 2015-05-14 – 2015-05-15 (×3): 100 mg via ORAL
  Filled 2015-05-14 (×3): qty 1

## 2015-05-14 MED ORDER — LORATADINE 10 MG PO TABS
10.0000 mg | ORAL_TABLET | Freq: Every day | ORAL | Status: DC
  Administered 2015-05-15 – 2015-05-18 (×4): 10 mg via ORAL
  Filled 2015-05-14 (×4): qty 1

## 2015-05-14 MED ORDER — FENTANYL 50 MCG/HR TD PT72
50.0000 ug | MEDICATED_PATCH | TRANSDERMAL | Status: DC
  Administered 2015-05-16: 50 ug via TRANSDERMAL
  Filled 2015-05-14: qty 1

## 2015-05-14 MED ORDER — SENNOSIDES-DOCUSATE SODIUM 8.6-50 MG PO TABS
1.0000 | ORAL_TABLET | Freq: Two times a day (BID) | ORAL | Status: DC
  Administered 2015-05-14 – 2015-05-16 (×5): 1 via ORAL
  Filled 2015-05-14 (×4): qty 1

## 2015-05-14 MED ORDER — CALCIUM CARBONATE-VITAMIN D 500-200 MG-UNIT PO TABS
1.0000 | ORAL_TABLET | Freq: Three times a day (TID) | ORAL | Status: DC
  Administered 2015-05-14 – 2015-05-18 (×11): 1 via ORAL
  Filled 2015-05-14 (×12): qty 1

## 2015-05-14 MED ORDER — VANCOMYCIN HCL IN DEXTROSE 1-5 GM/200ML-% IV SOLN
1000.0000 mg | Freq: Once | INTRAVENOUS | Status: AC
  Administered 2015-05-14: 1000 mg via INTRAVENOUS
  Filled 2015-05-14: qty 200

## 2015-05-14 MED ORDER — METHYLPREDNISOLONE SODIUM SUCC 40 MG IJ SOLR
40.0000 mg | Freq: Four times a day (QID) | INTRAMUSCULAR | Status: DC
Start: 2015-05-14 — End: 2015-05-18
  Administered 2015-05-14 – 2015-05-18 (×16): 40 mg via INTRAVENOUS
  Filled 2015-05-14 (×15): qty 1

## 2015-05-14 MED ORDER — ALBUTEROL SULFATE (2.5 MG/3ML) 0.083% IN NEBU
2.5000 mg | INHALATION_SOLUTION | Freq: Three times a day (TID) | RESPIRATORY_TRACT | Status: DC
  Administered 2015-05-15 – 2015-05-18 (×10): 2.5 mg via RESPIRATORY_TRACT
  Filled 2015-05-14 (×10): qty 3

## 2015-05-14 MED ORDER — ROPINIROLE HCL 1 MG PO TABS
4.0000 mg | ORAL_TABLET | Freq: Every day | ORAL | Status: DC
  Administered 2015-05-14 – 2015-05-17 (×4): 4 mg via ORAL
  Filled 2015-05-14 (×4): qty 4

## 2015-05-14 MED ORDER — AZTREONAM 1 G IJ SOLR
1.0000 g | Freq: Three times a day (TID) | INTRAMUSCULAR | Status: DC
  Administered 2015-05-14 – 2015-05-18 (×12): 1 g via INTRAVENOUS
  Filled 2015-05-14 (×16): qty 1

## 2015-05-14 MED ORDER — ALBUTEROL (5 MG/ML) CONTINUOUS INHALATION SOLN
10.0000 mg/h | INHALATION_SOLUTION | Freq: Once | RESPIRATORY_TRACT | Status: AC
  Administered 2015-05-14: 10 mg/h via RESPIRATORY_TRACT
  Filled 2015-05-14: qty 20

## 2015-05-14 MED ORDER — LEVETIRACETAM 250 MG PO TABS
250.0000 mg | ORAL_TABLET | Freq: Two times a day (BID) | ORAL | Status: DC
  Administered 2015-05-14 – 2015-05-15 (×4): 250 mg via ORAL
  Filled 2015-05-14 (×3): qty 1

## 2015-05-14 NOTE — ED Notes (Signed)
Pt from [facility]. EMS reports increasing respiratory distress over the past 1-2 days. Upon EMS arrival, RR 35, HR 128, sats 81% on 2L nasal cannula. Pt started on NRB. Pt is a DNR. MD Fayrene FearingJames at bedside.

## 2015-05-14 NOTE — Progress Notes (Signed)
ANTIBIOTIC CONSULT NOTE - INITIAL  Pharmacy Consult for Vancomycin & Azactam Indication: pneumonia  Allergies  Allergen Reactions  . Aricept [Donepezil Hcl]   . Bactrim [Sulfamethoxazole-Trimethoprim]   . Ciprofloxacin   . Darvocet [Propoxyphene N-Acetaminophen]   . Namenda [Memantine Hcl]   . Penicillins   . Vibramycin [Doxycycline Calcium]     Patient Measurements:   Adjusted Body Weight:   Vital Signs: BP: 121/53 mmHg (12/25 1100) Pulse Rate: 119 (12/25 1015) Intake/Output from previous day:   Intake/Output from this shift:    Labs:  Recent Labs  05/14/15 0923  WBC 18.8*  HGB 9.0*  PLT 247  CREATININE 1.37*   Estimated Creatinine Clearance: 18.6 mL/min (by C-G formula based on Cr of 1.37). No results for input(s): VANCOTROUGH, VANCOPEAK, VANCORANDOM, GENTTROUGH, GENTPEAK, GENTRANDOM, TOBRATROUGH, TOBRAPEAK, TOBRARND, AMIKACINPEAK, AMIKACINTROU, AMIKACIN in the last 72 hours.   Microbiology: Recent Results (from the past 720 hour(s))  Urine culture     Status: None   Collection Time: 05/07/15  9:29 PM  Result Value Ref Range Status   Specimen Description URINE, CATHETERIZED  Final   Special Requests NONE  Final   Culture   Final    NO GROWTH 1 DAY [facility]    Report Status 05/09/2015 FINAL  Final  Culture, blood (routine x 2)     Status: None   Collection Time: 05/07/15 11:36 PM  Result Value Ref Range Status   Specimen Description BLOOD LEFT ANTECUBITAL  Final   Special Requests BOTTLES DRAWN AEROBIC AND ANAEROBIC 6CC EACH  Final   Culture NO GROWTH 5 DAYS  Final   Report Status 05/13/2015 FINAL  Final  Culture, blood (routine x 2)     Status: None   Collection Time: 05/07/15 11:46 PM  Result Value Ref Range Status   Specimen Description BLOOD LEFT ANTECUBITAL  Final   Special Requests BOTTLES DRAWN AEROBIC AND ANAEROBIC 8CC EACH  Final   Culture NO GROWTH 5 DAYS  Final   Report Status 05/13/2015 FINAL  Final  MRSA PCR  Screening     Status: None   Collection Time: 05/09/15 11:05 AM  Result Value Ref Range Status   MRSA by PCR NEGATIVE NEGATIVE Final    Comment:        The GeneXpert MRSA Assay (FDA approved for NASAL specimens only), is one component of a comprehensive MRSA colonization surveillance program. It is not intended to diagnose MRSA infection nor to guide or monitor treatment for MRSA infections.     Medical History: Past Medical History  Diagnosis Date  . Seizures (HCC)   . Aspiration pneumonia (HCC)   . Dysphagia   . Osteoporosis   . Hypothyroidism   . Depression   . MRSA (methicillin resistant staph aureus) culture positive   . Respiratory failure (HCC)   . Vertebral compression fracture (HCC)     Thoracic and lumbar  . History of sinus cancer 1970's    Left side  . Chronic pain   . Essential hypertension   . GERD (gastroesophageal reflux disease)   . DNR (do not resuscitate)     Medications:  Scheduled:   Assessment: 81 yo female ED patient, [facility], increasing respiratory distress over past 1-2 days. Reduced renal function   Goal of Therapy:  Vancomycin trough level 15-20 mcg/ml  Plan:  Vancomycin 1 GM IV loading dose, then Vancomycin 500 mg IV every 48 hours Azactam 1 GM IV every 8 hours Monitor renal function  Vancomycin trough at steady state  [address], [place] 05/14/2015,11:36 AM

## 2015-05-14 NOTE — ED Notes (Signed)
Patient maintaining 02 of 100% on 5 liters [place]. Per daughter, she normally wears 4 liters continuously at [facility]. MD updated.

## 2015-05-14 NOTE — H&P (Signed)
[facility] History and Physical  LUCIANA CAMMARATA ZOX:[id number] DOB: Oct 25, 1922    PCP:   Fredirick Maudlin, MD   Chief Complaint: SOB and coughs.   HPI: [name] is an 81 y.o. female patient of Dr Juanetta Gosling, with hx of recurrent aspiration PNA and HCAP, hx of hypothyroidism, prior hypercapneic hypoxemic respiratory failure, recently admitted for HCAP, discharged three days ago, brought back to the ER with SOB and wheezing.  She was requiring higher level of oxygenation.   She improved with neb in the ER. Evalaution in the ER showed leukocytosis with WBC of 18K (please note steroids), CXR showed LLL PNA, and Cr stable at 1.4.  Hospitalist was asked to admit her for aspiration PNA.   She is a DNR.   Rewiew of Systems:  Constitutional: Negative for malaise, fever and chills. No significant weight loss or weight gain Eyes: Negative for eye pain, redness and discharge, diplopia, visual changes, or flashes of light. ENMT: Negative for ear pain, hoarseness, nasal congestion, sinus pressure and sore throat. No headaches; tinnitus, drooling, or problem swallowing. Cardiovascular: Negative for chest pain, palpitations, diaphoresis,  and peripheral edema. ; No orthopnea, PND Respiratory: Negative for cough, hemoptysis, wheezing and stridor. No pleuritic chestpain. Gastrointestinal: Negative for nausea, vomiting, diarrhea, constipation, abdominal pain, melena, blood in stool, hematemesis, jaundice and rectal bleeding.    Genitourinary: Negative for frequency, dysuria, incontinence,flank pain and hematuria; Musculoskeletal: Negative for back pain and neck pain. Negative for swelling and trauma.;  Skin: . Negative for pruritus, rash, abrasions, bruising and skin lesion.; ulcerations Neuro: Negative for headache, lightheadedness and neck stiffness. Negative for weakness, altered level of consciousness , altered mental status, extremity weakness, burning feet, involuntary movement, seizure and  syncope.  Psych: negative for anxiety, depression, insomnia, tearfulness, panic attacks, hallucinations, paranoia, suicidal or homicidal ideation    Past Medical History  Diagnosis Date  . Seizures (HCC)   . Aspiration pneumonia (HCC)   . Dysphagia   . Osteoporosis   . Hypothyroidism   . Depression   . MRSA (methicillin resistant staph aureus) culture positive   . Respiratory failure (HCC)   . Vertebral compression fracture (HCC)     Thoracic and lumbar  . History of sinus cancer 1970's    Left side  . Chronic pain   . Essential hypertension   . GERD (gastroesophageal reflux disease)   . DNR (do not resuscitate)     Past Surgical History  Procedure Laterality Date  . Kyphoplasty      Lumbar and thoracic  . Palate surgery  1970's    Removal of hard and soft palate on left side  . Enucleation Left 1970's  . Cholecystectomy    . Appendectomy    . Cesarean section      Medications:  HOME MEDS: Prior to Admission medications   Medication Sig Start Date End Date Taking? Authorizing Provider  alum & mag hydroxide-simeth (MYLANTA) 200-200-20 MG/5ML suspension Take 30 mLs by mouth every 4 (four) hours as needed for indigestion or heartburn.    Historical Provider, MD  Amino Acids-Protein Hydrolys (FEEDING SUPPLEMENT, PRO-STAT SUGAR FREE 64,) LIQD Take 30 mLs by mouth 2 (two) times daily.    Historical Provider, MD  calcium-vitamin D (OSCAL WITH D) 500-200 MG-UNIT per tablet Take 1 tablet by mouth 3 (three) times daily. For osteoporosis    Historical Provider, MD  clindamycin (CLEOCIN) 300 MG capsule Take 1 capsule (300 mg total) by mouth 4 (four) times daily. 05/11/15  Kari Baars, MD  Cranberry-Vitamin C-Probiotic (AZO CRANBERRY PO) Take 1 tablet by mouth 2 (two) times daily.    Historical Provider, MD  docusate sodium (COLACE) 100 MG capsule Take 100 mg by mouth 2 (two) times daily. For constipation    Historical Provider, MD  escitalopram (LEXAPRO) 10 MG tablet Take 10 mg  by mouth daily. For depression    Historical Provider, MD  fentaNYL (DURAGESIC - DOSED MCG/HR) 50 MCG/HR Place 50 mcg onto the skin every other day.    Historical Provider, MD  furosemide (LASIX) 20 MG tablet Take 20 mg by mouth daily. For HTN/ edema    Historical Provider, MD  HYDROcodone-acetaminophen (NORCO/VICODIN) 5-325 MG tablet Take 0.5 tablets by mouth 2 (two) times daily. 05/11/15   Kari Baars, MD  hydroxypropyl methylcellulose / hypromellose (ISOPTO TEARS / GONIOVISC) 2.5 % ophthalmic solution Place 1 drop into the right eye as needed for dry eyes.    Historical Provider, MD  ipratropium-albuterol (DUONEB) 0.5-2.5 (3) MG/3ML SOLN Take 3 mLs by nebulization every 6 (six) hours as needed (Shortness Of Breath).    Historical Provider, MD  iron polysaccharides (NIFEREX) 150 MG capsule Take 150 mg by mouth daily.    Historical Provider, MD  levETIRAcetam (KEPPRA) 250 MG tablet Take 250 mg by mouth every 12 (twelve) hours. For seizures    Historical Provider, MD  levothyroxine (SYNTHROID, LEVOTHROID) 175 MCG tablet Take 175 mcg by mouth daily. For thyroid therapy    Historical Provider, MD  loratadine (CLARITIN) 10 MG tablet Take 10 mg by mouth daily. For allergies    Historical Provider, MD  magnesium hydroxide (MILK OF MAGNESIA) 400 MG/5ML suspension Take 30 mLs by mouth daily as needed for mild constipation.    Historical Provider, MD  metoprolol tartrate (LOPRESSOR) 25 MG tablet Take 25 mg by mouth daily. For HTN    Historical Provider, MD  Multiple Vitamin (MULTIVITAMIN WITH MINERALS) TABS Take 1 tablet by mouth daily. For nutritional supplement    Historical Provider, MD  phenylephrine-shark liver oil-mineral oil-petrolatum (PREPARATION H) 0.25-3-14-71.9 % rectal ointment Place 1 application rectally as needed for hemorrhoids.    Historical Provider, MD  predniSONE (DELTASONE) 10 MG tablet Take 1 tablet (10 mg total) by mouth daily with breakfast. Take 40 mg daily 3 days 303 days 203  days 103 days 05/11/15   Kari Baars, MD  ranitidine (ZANTAC) 150 MG tablet Take 150 mg by mouth at bedtime.    Historical Provider, MD  rOPINIRole (REQUIP) 4 MG tablet Take 4 mg by mouth at bedtime. For parkinson disease    Historical Provider, MD  sennosides-docusate sodium (SENOKOT-S) 8.6-50 MG tablet Take 1 tablet by mouth 2 (two) times daily. For constipation    Historical Provider, MD     Allergies:  Allergies  Allergen Reactions  . Aricept [Donepezil Hcl]   . Bactrim [Sulfamethoxazole-Trimethoprim]   . Ciprofloxacin   . Darvocet [Propoxyphene N-Acetaminophen]   . Namenda [Memantine Hcl]   . Penicillins   . Vibramycin [Doxycycline Calcium]     Social History:   reports that she has never smoked. She does not have any smokeless tobacco history on file. She reports that she does not drink alcohol or use illicit drugs.  Family History: Family History  Problem Relation Age of Onset  . Cancer Father      Physical Exam: Filed Vitals:   05/14/15 1000 05/14/15 1015 05/14/15 1100 05/14/15 1130  BP:   121/53 132/55  Pulse: 115 119  Resp: 28 29 24 22   SpO2: 100% 99%     Blood pressure 132/55, pulse 119, resp. rate 22, SpO2 99 %.  GEN:  Pleasant  patient lying in the stretcher in no acute distress; cooperative with exam. PSYCH:  alert and oriented x4; does not appear anxious or depressed; affect is appropriate. HEENT: Mucous membranes pink and anicteric;  no cervical lymphadenopathy nor thyromegaly or carotid bruit; no JVD; There were no stridor. Neck is very supple. Breasts:: Not examined CHEST WALL: No tenderness CHEST: Normal respiration, clear to auscultation bilaterally.  HEART: Regular rate and rhythm.  There are no murmur, rub, or gallops.   BACK: No kyphosis or scoliosis; no CVA tenderness ABDOMEN: soft and non-tender; no masses, no organomegaly, normal abdominal bowel sounds; no pannus; no intertriginous candida. There is no rebound and no distention. Rectal  Exam: Not done EXTREMITIES: No bone or joint deformity; age-appropriate arthropathy of the hands and knees; no edema; no ulcerations.  There is no calf tenderness. Genitalia: not examined PULSES: 2+ and symmetric SKIN: Normal hydration no rash or ulceration CNS: Cranial nerves 2-12 grossly intact no focal lateralizing neurologic deficit.  Speech is fluent; uvula elevated with phonation, facial symmetry and tongue midline. DTR are normal bilaterally, cerebella exam is intact, barbinski is negative and strengths are equaled bilaterally.  No sensory loss.   Labs on Admission:  Basic Metabolic Panel:  Recent Labs Lab 05/07/15 2111 05/08/15 0441 05/09/15 0740 05/10/15 0423 05/14/15 0923  NA 138 139 140 142 141  K 5.4* 5.5* 4.6 4.2 3.7  CL 92* 99* 105 108 101  CO2 39* 35* 29 28 33*  GLUCOSE 198* 113* 85 137* 139*  BUN 50* 51* 37* 33* 34*  CREATININE 1.97* 1.81* 1.21* 1.13* 1.37*  CALCIUM 9.8 9.2 8.4* 8.3* 9.8   Liver Function Tests:  Recent Labs Lab 05/08/15 0441  AST 27  ALT 10*  ALKPHOS 59  BILITOT 0.4  PROT 6.5  ALBUMIN 2.6*   CBC:  Recent Labs Lab 05/07/15 2244 05/08/15 0441 05/09/15 0740 05/10/15 0423 05/14/15 0923  WBC 17.9* 16.8* 9.7 6.1 18.8*  NEUTROABS 16.8*  --  8.8* 5.7 17.9*  HGB 8.7* 7.8* 7.9* 7.9* 9.0*  HCT 29.2* 26.2* 26.0* 26.3* 30.2*  MCV 95.4 94.6 93.5 93.6 94.1  PLT 179 150 157 167 247   Cardiac Enzymes:  Recent Labs Lab 05/07/15 2111 05/08/15 05/08/15 0715 05/08/15 1328 05/14/15 0923  TROPONINI 0.40* 0.37* 0.26* 0.16* 0.06*   Radiological Exams on Admission: Dg Chest Port 1 View  05/14/2015  CLINICAL DATA:  Gradually worsening respiratory distress for 24 hours EXAM: PORTABLE CHEST 1 VIEW COMPARISON:  May 07, 2015 FINDINGS: The heart size is enlarged. The mediastinal contour is stable. There is consolidation of the left mid and lung base. There is left pleural effusion. The right lung is clear. The bony structures are stable.  IMPRESSION: Pneumonia of the left mid and lung base with left pleural effusion. Electronically Signed   By: Sherian ReinWei-Chen  Lin M.D.   On: 05/14/2015 09:55   Assessment/Plan Present on Admission:  . Pneumonia . Healthcare-associated pneumonia . COPD exacerbation (HCC) . Acute on chronic respiratory failure with hypoxia (HCC) . Hypothyroidism . Osteoporosis . HCAP (healthcare-associated pneumonia)  PLAN: Healthcare associated pneumonia We re- admit the patient again and continue with IV Van/Aztreonam per pharmacy.  I broached the subject to palliative care consultation, but will defer to Dr Juanetta GoslingHawkins, who knows her well.  Option for recurrent aspiration PNA is limited without PEG,  and I don't think family would like to proceed with it.  She is currently stable, and will give IV Sterids, neb, and IV antibiotics.    Chronic anemia Patient has chronic anemia, today hemoglobin is 9.0 Check CBC in a.m.  Hypothyrodiism:  Continue with supplement, check TSH.   Other plans as per orders. Code Status: DNR.    Houston Siren, MD. FACP [facility] Pager [phone] 7pm to 7am.  05/14/2015, 1:10 PM

## 2015-05-14 NOTE — ED Notes (Signed)
Patient 100% on NRB at 15 liters. MD notified, [facility] and [facility] how 02 sats maintain. Patient placed on 5 liters [place]. RT at bedside. Will continue to monitor closely.

## 2015-05-14 NOTE — ED Provider Notes (Signed)
History  By signing my name below, I, Karle Plumber, attest that this documentation has been prepared under the direction and in the presence of Rolland Porter, MD. Electronically Signed: Karle Plumber, ED Scribe. 05/14/2015. 10:38 AM  Chief Complaint  Patient presents with  . Respiratory Distress   The history is provided by the patient, medical records, the EMS personnel and a caregiver. No language interpreter was used.    HPI Comments:  [name] is a 81 y.o. female with a DNR order brought in by EMS from the [facility] nursing facility, who presents to the Emergency Department complaining of gradually worsening respiratory distress that began at least 24 hours ago. Family reports the patient was recently admitted about one week ago for respiratory distress.  Discharged on the 22nd.  She reports some central anterior CP but is unable to specify when it began. Her family states she fell a few days ago and has been complaining of the pain since. EMS states her respiratory rate was 81% on room air and when placed on a nonrebreather, she did not improve at all.   Past Medical History  Diagnosis Date  . Seizures (HCC)   . Aspiration pneumonia (HCC)   . Dysphagia   . Osteoporosis   . Hypothyroidism   . Depression   . MRSA (methicillin resistant staph aureus) culture positive   . Respiratory failure (HCC)   . Vertebral compression fracture (HCC)     Thoracic and lumbar  . History of sinus cancer 1970's    Left side  . Chronic pain   . Essential hypertension   . GERD (gastroesophageal reflux disease)   . DNR (do not resuscitate)    Past Surgical History  Procedure Laterality Date  . Kyphoplasty      Lumbar and thoracic  . Palate surgery  1970's    Removal of hard and soft palate on left side  . Enucleation Left 1970's  . Cholecystectomy    . Appendectomy    . Cesarean section     Family History  Problem Relation Age of Onset  . Cancer Father    Social History   Substance Use Topics  . Smoking status: Never Smoker   . Smokeless tobacco: None  . Alcohol Use: No   OB History    No data available     Review of Systems  Constitutional: Negative for fever, chills, diaphoresis, appetite change and fatigue.  HENT: Negative for mouth sores, sore throat and trouble swallowing.   Eyes: Negative for visual disturbance.  Respiratory: Positive for shortness of breath. Negative for cough, chest tightness and wheezing.   Cardiovascular: Positive for chest pain.  Gastrointestinal: Negative for nausea, vomiting, abdominal pain, diarrhea and abdominal distention.  Endocrine: Negative for polydipsia, polyphagia and polyuria.  Genitourinary: Negative for dysuria, frequency and hematuria.  Musculoskeletal: Negative for gait problem.  Skin: Negative for color change, pallor and rash.  Neurological: Negative for dizziness, syncope, light-headedness and headaches.  Hematological: Does not bruise/bleed easily.  Psychiatric/Behavioral: Negative for behavioral problems and confusion.    Allergies  Aricept; Bactrim; Ciprofloxacin; Darvocet; Namenda; Penicillins; and Vibramycin  Home Medications   Prior to Admission medications   Medication Sig Start Date End Date Taking? Authorizing Provider  Amino Acids-Protein Hydrolys (FEEDING SUPPLEMENT, PRO-STAT SUGAR FREE 64,) LIQD Take 30 mLs by mouth 2 (two) times daily.   Yes Historical Provider, MD  calcium-vitamin D (OSCAL WITH D) 500-200 MG-UNIT per tablet Take 1 tablet by mouth 3 (three) times  daily. For osteoporosis   Yes Historical Provider, MD  docusate sodium (COLACE) 100 MG capsule Take 100 mg by mouth 2 (two) times daily. For constipation   Yes Historical Provider, MD  escitalopram (LEXAPRO) 10 MG tablet Take 10 mg by mouth daily. For depression   Yes Historical Provider, MD  fentaNYL (DURAGESIC - DOSED MCG/HR) 50 MCG/HR Place 50 mcg onto the skin every other day.   Yes Historical Provider, MD  furosemide  (LASIX) 20 MG tablet Take 20 mg by mouth daily. For HTN/ edema   Yes Historical Provider, MD  HYDROcodone-acetaminophen (NORCO/VICODIN) 5-325 MG tablet Take 0.5 tablets by mouth 2 (two) times daily. 05/11/15  Yes Kari Baars, MD  ipratropium-albuterol (DUONEB) 0.5-2.5 (3) MG/3ML SOLN Take 3 mLs by nebulization every 6 (six) hours as needed (Shortness Of Breath).   Yes Historical Provider, MD  iron polysaccharides (NIFEREX) 150 MG capsule Take 150 mg by mouth daily.   Yes Historical Provider, MD  levETIRAcetam (KEPPRA) 250 MG tablet Take 250 mg by mouth every 12 (twelve) hours. For seizures   Yes Historical Provider, MD  levothyroxine (SYNTHROID, LEVOTHROID) 175 MCG tablet Take 175 mcg by mouth daily. For thyroid therapy   Yes Historical Provider, MD  magnesium hydroxide (MILK OF MAGNESIA) 400 MG/5ML suspension Take 30 mLs by mouth daily as needed for mild constipation.   Yes Historical Provider, MD  metoprolol tartrate (LOPRESSOR) 25 MG tablet Take 25 mg by mouth daily. For HTN   Yes Historical Provider, MD  predniSONE (DELTASONE) 10 MG tablet Take 1 tablet (10 mg total) by mouth daily with breakfast. Take 40 mg daily 3 days 303 days 203 days 103 days 05/11/15  Yes Kari Baars, MD  ranitidine (ZANTAC) 150 MG tablet Take 150 mg by mouth at bedtime.   Yes Historical Provider, MD  rOPINIRole (REQUIP) 4 MG tablet Take 4 mg by mouth at bedtime. For parkinson disease   Yes Historical Provider, MD  sennosides-docusate sodium (SENOKOT-S) 8.6-50 MG tablet Take 1 tablet by mouth 2 (two) times daily. For constipation   Yes Historical Provider, MD  aztreonam 1 g in dextrose 5 % 50 mL Inject 1 g into the vein every 8 (eight) hours. 05/06/2015   Kari Baars, MD  hydroxypropyl methylcellulose / hypromellose (ISOPTO TEARS / GONIOVISC) 2.5 % ophthalmic solution Place 1 drop into the right eye as needed for dry eyes.    Historical Provider, MD  vancomycin 500 mg in sodium chloride 0.9 % 100 mL Inject 500 mg  into the vein daily. 04/23/2015   Kari Baars, MD   BP 153/80 mmHg  Pulse 90  Temp(Src) 98.1 F (36.7 C) (Oral)  Resp 16  Ht  (1.499 m)  Wt 97 lb (43.999 kg)  BMI 19.58 kg/m2  SpO2 91% Physical Exam  Constitutional: She is oriented to person, place, and time. No distress. Face mask in place.  Thin and frail Globally diminished Speaking 1-3 words at a time  HENT:  Head: Normocephalic.  Eyes: Conjunctivae are normal. Pupils are equal, round, and reactive to light. No scleral icterus.  Patch on left eye  Neck: Normal range of motion. Neck supple. No thyromegaly present.  Cardiovascular: Normal rate and regular rhythm.  Exam reveals no gallop and no friction rub.   No murmur heard. Pulmonary/Chest: Tachypnea noted. No respiratory distress. She has wheezes. She has rales.  Rhonchirous breath sounds Globally diminished  Abdominal: Soft. Bowel sounds are normal. She exhibits no distension. There is no tenderness. There is no  rebound.  Musculoskeletal: Normal range of motion. She exhibits no edema.  Neurological: She is alert and oriented to person, place, and time.  Skin: Skin is warm and dry. No rash noted.  Psychiatric: She has a normal mood and affect. Her behavior is normal.    ED Course  Procedures (including critical care time) DIAGNOSTIC STUDIES: Oxygen Saturation is 96% on facemask 5 L/min, adequate by my interpretation.   COORDINATION OF CARE: 9:26 AM- Will order labs, continuous nebulizer treatment and CXR. Pt verbalizes understanding and agrees to plan.  Medications  albuterol (PROVENTIL,VENTOLIN) solution continuous neb (10 mg/hr Nebulization Given 05/14/15 0943)  vancomycin (VANCOCIN) IVPB 1000 mg/200 mL premix (0 mg Intravenous Stopped 05/14/15 1318)  heparin lock flush 100 unit/mL (250 Units Intracatheter Given 05/08/2015 1740)    Labs Review Labs Reviewed  CBC WITH DIFFERENTIAL/PLATELET - Abnormal; Notable for the following:    WBC 18.8 (*)    RBC 3.21  (*)    Hemoglobin 9.0 (*)    HCT 30.2 (*)    MCHC 29.8 (*)    RDW 16.5 (*)    Neutro Abs 17.9 (*)    Lymphs Abs 0.5 (*)    All other components within normal limits  BASIC METABOLIC PANEL - Abnormal; Notable for the following:    CO2 33 (*)    Glucose, Bld 139 (*)    BUN 34 (*)    Creatinine, Ser 1.37 (*)    GFR calc non Af Amer 33 (*)    GFR calc Af Amer 38 (*)    All other components within normal limits  TROPONIN I - Abnormal; Notable for the following:    Troponin I 0.06 (*)    All other components within normal limits  BRAIN NATRIURETIC PEPTIDE - Abnormal; Notable for the following:    B Natriuretic Peptide 394.0 (*)    All other components within normal limits  BLOOD GAS, ARTERIAL - Abnormal; Notable for the following:    pCO2 arterial 55.0 (*)    Bicarbonate 30.0 (*)    Acid-Base Excess 6.6 (*)    All other components within normal limits  CBC - Abnormal; Notable for the following:    WBC 17.8 (*)    RBC 2.60 (*)    Hemoglobin 7.5 (*)    HCT 24.3 (*)    RDW 16.3 (*)    All other components within normal limits  COMPREHENSIVE METABOLIC PANEL - Abnormal; Notable for the following:    CO2 35 (*)    Glucose, Bld 138 (*)    BUN 36 (*)    Creatinine, Ser 1.19 (*)    Total Protein 6.0 (*)    Albumin 2.3 (*)    GFR calc non Af Amer 39 (*)    GFR calc Af Amer 45 (*)    Anion gap 3 (*)    All other components within normal limits  BASIC METABOLIC PANEL - Abnormal; Notable for the following:    CO2 34 (*)    Glucose, Bld 112 (*)    BUN 42 (*)    Creatinine, Ser 1.11 (*)    GFR calc non Af Amer 42 (*)    GFR calc Af Amer 49 (*)    All other components within normal limits  CBC - Abnormal; Notable for the following:    WBC 17.9 (*)    RBC 2.73 (*)    Hemoglobin 8.3 (*)    HCT 25.9 (*)    RDW 16.2 (*)  All other components within normal limits  BASIC METABOLIC PANEL - Abnormal; Notable for the following:    Chloride 100 (*)    CO2 37 (*)    Glucose, Bld 112  (*)    BUN 38 (*)    GFR calc non Af Amer 51 (*)    GFR calc Af Amer 59 (*)    Anion gap 4 (*)    All other components within normal limits  CBC - Abnormal; Notable for the following:    WBC 17.9 (*)    RBC 2.80 (*)    Hemoglobin 7.9 (*)    HCT 26.2 (*)    RDW 15.7 (*)    All other components within normal limits  CULTURE, BLOOD (ROUTINE X 2)  CULTURE, BLOOD (ROUTINE X 2)  MRSA PCR SCREENING  TSH  I-STAT CG4 LACTIC ACID, ED    Imaging Review No results found. I have personally reviewed and evaluated these images and lab results as part of my medical decision-making.   EKG Interpretation   Date/Time:  Sunday May 14 2015 09:16:45 EST Ventricular Rate:  119 PR Interval:  107 QRS Duration: 80 QT Interval:  301 QTC Calculation: 423 R Axis:   -18 Text Interpretation:  Sinus tachycardia Borderline left axis deviation  Repol abnrm suggests ischemia, anterolateral ED PHYSICIAN INTERPRETATION  AVAILABLE IN [facility] Confirmed by TEST, Record ([id number]) on  05/15/2015 7:02:34 AM      MDM   Final diagnoses:  Aspiration pneumonia of left lung, unspecified aspiration pneumonia type, unspecified part of lung (HCC)      I personally performed the services described in this documentation, which was scribed in my presence. The recorded information has been reviewed and is accurate.     Rolland PorterMark Lucia Harm, MD 05/23/15 [phone]

## 2015-05-15 LAB — COMPREHENSIVE METABOLIC PANEL
ALBUMIN: 2.3 g/dL — AB (ref 3.5–5.0)
ALT: 14 U/L (ref 14–54)
ANION GAP: 3 — AB (ref 5–15)
AST: 15 U/L (ref 15–41)
Alkaline Phosphatase: 63 U/L (ref 38–126)
BUN: 36 mg/dL — ABNORMAL HIGH (ref 6–20)
CO2: 35 mmol/L — AB (ref 22–32)
Calcium: 9.5 mg/dL (ref 8.9–10.3)
Chloride: 103 mmol/L (ref 101–111)
Creatinine, Ser: 1.19 mg/dL — ABNORMAL HIGH (ref 0.44–1.00)
GFR calc Af Amer: 45 mL/min — ABNORMAL LOW (ref >60)
GFR calc non Af Amer: 39 mL/min — ABNORMAL LOW (ref >60)
GLUCOSE: 138 mg/dL — AB (ref 65–99)
POTASSIUM: 4.5 mmol/L (ref 3.5–5.1)
SODIUM: 141 mmol/L (ref 135–145)
TOTAL PROTEIN: 6 g/dL — AB (ref 6.5–8.1)
Total Bilirubin: 0.3 mg/dL (ref 0.3–1.2)

## 2015-05-15 LAB — MRSA PCR SCREENING: MRSA BY PCR: NEGATIVE

## 2015-05-15 LAB — CBC
HCT: 24.3 % — ABNORMAL LOW (ref 36.0–46.0)
Hemoglobin: 7.5 g/dL — ABNORMAL LOW (ref 12.0–15.0)
MCH: 28.8 pg (ref 26.0–34.0)
MCHC: 30.9 g/dL (ref 30.0–36.0)
MCV: 93.5 fL (ref 78.0–100.0)
PLATELETS: 231 10*3/uL (ref 150–400)
RBC: 2.6 MIL/uL — ABNORMAL LOW (ref 3.87–5.11)
RDW: 16.3 % — ABNORMAL HIGH (ref 11.5–15.5)
WBC: 17.8 10*3/uL — ABNORMAL HIGH (ref 4.0–10.5)

## 2015-05-15 MED ORDER — VANCOMYCIN HCL 500 MG IV SOLR
500.0000 mg | INTRAVENOUS | Status: DC
  Administered 2015-05-15 – 2015-05-18 (×3): 500 mg via INTRAVENOUS
  Filled 2015-05-15 (×5): qty 500

## 2015-05-15 NOTE — Progress Notes (Signed)
Subjective: Patient was admitted due cough and shortness of breath due to Pneumonia and COPD. Patient has history of recurrent aspiration.  Objective: Vital signs in last 24 hours: Temp:  [97.5 F (36.4 C)-97.9 F (36.6 C)] 97.8 F (36.6 C) (12/26 1641) Pulse Rate:  [72-107] 72 (12/26 1641) Resp:  [22-24] 22 (12/26 1641) BP: (115-142)/(52-81) 131/52 mmHg (12/26 1641) SpO2:  [80 %-100 %] 99 % (12/26 1641) Weight change:  Last BM Date: 05/11/15  Intake/Output from previous day:    PHYSICAL EXAM General appearance: mild distress and slowed mentation Resp: diminished breath sounds bilaterally and rhonchi bilaterally Cardio: S1, S2 normal GI: soft, non-tender; bowel sounds normal; no masses,  no organomegaly Extremities: extremities normal, atraumatic, no cyanosis or edema  Lab Results:  Results for orders placed or performed during the hospital encounter of 05/14/15 (from the past 48 hour(s))  CBC with Differential/Platelet     Status: Abnormal   Collection Time: 05/14/15  9:23 AM  Result Value Ref Range   WBC 18.8 (H) 4.0 - 10.5 K/uL   RBC 3.21 (L) 3.87 - 5.11 MIL/uL   Hemoglobin 9.0 (L) 12.0 - 15.0 g/dL   HCT 30.2 (L) 36.0 - 46.0 %   MCV 94.1 78.0 - 100.0 fL   MCH 28.0 26.0 - 34.0 pg   MCHC 29.8 (L) 30.0 - 36.0 g/dL   RDW 16.5 (H) 11.5 - 15.5 %   Platelets 247 150 - 400 K/uL   Neutrophils Relative % 95 %   Neutro Abs 17.9 (H) 1.7 - 7.7 K/uL   Lymphocytes Relative 3 %   Lymphs Abs 0.5 (L) 0.7 - 4.0 K/uL   Monocytes Relative 2 %   Monocytes Absolute 0.4 0.1 - 1.0 K/uL   Eosinophils Relative 0 %   Eosinophils Absolute 0.0 0.0 - 0.7 K/uL   Basophils Relative 0 %   Basophils Absolute 0.0 0.0 - 0.1 K/uL  Basic metabolic panel     Status: Abnormal   Collection Time: 05/14/15  9:23 AM  Result Value Ref Range   Sodium 141 135 - 145 mmol/L   Potassium 3.7 3.5 - 5.1 mmol/L   Chloride 101 101 - 111 mmol/L   CO2 33 (H) 22 - 32 mmol/L   Glucose, Bld 139 (H) 65 - 99 mg/dL   BUN 34 (H) 6 - 20 mg/dL   Creatinine, Ser 1.37 (H) 0.44 - 1.00 mg/dL   Calcium 9.8 8.9 - 10.3 mg/dL   GFR calc non Af Amer 33 (L) >60 mL/min   GFR calc Af Amer 38 (L) >60 mL/min    Comment: (NOTE) The eGFR has been calculated using the CKD EPI equation. This calculation has not been validated in all clinical situations. eGFR's persistently <60 mL/min signify possible Chronic Kidney Disease.    Anion gap 7 5 - 15  Troponin I     Status: Abnormal   Collection Time: 05/14/15  9:23 AM  Result Value Ref Range   Troponin I 0.06 (H) <0.031 ng/mL    Comment:        PERSISTENTLY INCREASED TROPONIN VALUES IN THE RANGE OF 0.04-0.49 ng/mL CAN BE SEEN IN:       -UNSTABLE ANGINA       -CONGESTIVE HEART FAILURE       -MYOCARDITIS       -CHEST TRAUMA       -ARRYHTHMIAS       -LATE PRESENTING MYOCARDIAL INFARCTION       -COPD   CLINICAL FOLLOW-UP RECOMMENDED.  Brain natriuretic peptide     Status: Abnormal   Collection Time: 05/14/15  9:23 AM  Result Value Ref Range   B Natriuretic Peptide 394.0 (H) 0.0 - 100.0 pg/mL  I-Stat CG4 Lactic Acid, ED     Status: None   Collection Time: 05/14/15  9:32 AM  Result Value Ref Range   Lactic Acid, Venous 1.62 0.5 - 2.0 mmol/L  Blood gas, arterial     Status: Abnormal   Collection Time: 05/14/15 10:30 AM  Result Value Ref Range   O2 Content 5.0 L/min   Delivery systems NASAL CANNULA    pH, Arterial 7.378 7.350 - 7.450   pCO2 arterial 55.0 (H) 35.0 - 45.0 mmHg   pO2, Arterial 80.1 80.0 - 100.0 mmHg   Bicarbonate 30.0 (H) 20.0 - 24.0 mEq/L   Acid-Base Excess 6.6 (H) 0.0 - 2.0 mmol/L   O2 Saturation 95.4 %   Collection site RIGHT RADIAL    Drawn by [phone]    Sample type ARTERIAL    Allens test (pass/fail) PASS PASS  TSH     Status: None   Collection Time: 05/14/15  2:39 PM  Result Value Ref Range   TSH 3.470 0.350 - 4.500 uIU/mL  CBC     Status: Abnormal   Collection Time: 05/15/15  6:41 AM  Result Value Ref Range   WBC 17.8 (H) 4.0 - 10.5  K/uL   RBC 2.60 (L) 3.87 - 5.11 MIL/uL   Hemoglobin 7.5 (L) 12.0 - 15.0 g/dL   HCT 24.3 (L) 36.0 - 46.0 %   MCV 93.5 78.0 - 100.0 fL   MCH 28.8 26.0 - 34.0 pg   MCHC 30.9 30.0 - 36.0 g/dL   RDW 16.3 (H) 11.5 - 15.5 %   Platelets 231 150 - 400 K/uL  Comprehensive metabolic panel     Status: Abnormal   Collection Time: 05/15/15  6:41 AM  Result Value Ref Range   Sodium 141 135 - 145 mmol/L   Potassium 4.5 3.5 - 5.1 mmol/L    Comment: DELTA CHECK NOTED   Chloride 103 101 - 111 mmol/L   CO2 35 (H) 22 - 32 mmol/L   Glucose, Bld 138 (H) 65 - 99 mg/dL   BUN 36 (H) 6 - 20 mg/dL   Creatinine, Ser 1.19 (H) 0.44 - 1.00 mg/dL   Calcium 9.5 8.9 - 10.3 mg/dL   Total Protein 6.0 (L) 6.5 - 8.1 g/dL   Albumin 2.3 (L) 3.5 - 5.0 g/dL   AST 15 15 - 41 U/L   ALT 14 14 - 54 U/L   Alkaline Phosphatase 63 38 - 126 U/L   Total Bilirubin 0.3 0.3 - 1.2 mg/dL   GFR calc non Af Amer 39 (L) >60 mL/min   GFR calc Af Amer 45 (L) >60 mL/min    Comment: (NOTE) The eGFR has been calculated using the CKD EPI equation. This calculation has not been validated in all clinical situations. eGFR's persistently <60 mL/min signify possible Chronic Kidney Disease.    Anion gap 3 (L) 5 - 15    ABGS  Recent Labs  05/14/15 1030  PHART 7.378  PO2ART 80.1  HCO3 30.0*   CULTURES Recent Results (from the past 240 hour(s))  Urine culture     Status: None   Collection Time: 05/07/15  9:29 PM  Result Value Ref Range Status   Specimen Description URINE, CATHETERIZED  Final   Special Requests NONE  Final   Culture   Final  NO GROWTH 1 DAY Performed at [facility]    Report Status 05/09/2015 FINAL  Final  Culture, blood (routine x 2)     Status: None   Collection Time: 05/07/15 11:36 PM  Result Value Ref Range Status   Specimen Description BLOOD LEFT ANTECUBITAL  Final   Special Requests BOTTLES DRAWN AEROBIC AND ANAEROBIC [place]  Final   Culture NO GROWTH 5 DAYS  Final   Report Status 05/13/2015  FINAL  Final  Culture, blood (routine x 2)     Status: None   Collection Time: 05/07/15 11:46 PM  Result Value Ref Range Status   Specimen Description BLOOD LEFT ANTECUBITAL  Final   Special Requests BOTTLES DRAWN AEROBIC AND ANAEROBIC [place]  Final   Culture NO GROWTH 5 DAYS  Final   Report Status 05/13/2015 FINAL  Final  MRSA PCR Screening     Status: None   Collection Time: 05/09/15 11:05 AM  Result Value Ref Range Status   MRSA by PCR NEGATIVE NEGATIVE Final    Comment:        The GeneXpert MRSA Assay (FDA approved for NASAL specimens only), is one component of a comprehensive MRSA colonization surveillance program. It is not intended to diagnose MRSA infection nor to guide or monitor treatment for MRSA infections.    Studies/Results: Dg Chest Port 1 View  05/14/2015  CLINICAL DATA:  Gradually worsening respiratory distress for 24 hours EXAM: PORTABLE CHEST 1 VIEW COMPARISON:  May 07, 2015 FINDINGS: The heart size is enlarged. The mediastinal contour is stable. There is consolidation of the left mid and lung base. There is left pleural effusion. The right lung is clear. The bony structures are stable. IMPRESSION: Pneumonia of the left mid and lung base with left pleural effusion. Electronically Signed   By: Abelardo Diesel M.D.   On: 05/14/2015 09:55    Medications: I have reviewed the patient's current medications.  Assesment:   Principal Problem:   Healthcare-associated pneumonia Active Problems:   HCAP (healthcare-associated pneumonia)   Hypothyroidism   Osteoporosis   COPD exacerbation ([place])   Acute on chronic respiratory failure with hypoxia ([place])   Seizure disorder ([place])   Pneumonia    Plan:  Medications reviewed Continue iv antibiotics Agree with palliative care consult Continue current treatment    LOS: 1 day   Marwah Disbro 05/15/2015, 7:22 PM

## 2015-05-15 NOTE — Care Management Note (Signed)
Case Management Note  Patient Details  Name: [name] MRN: [id number] Date of Birth: Jun 09, 1922  Subjective/Objective:                  [facility] with [facility]. Pt will return to facility at discharge.  Action/Plan: CSW is aware and will arrange discharge to facility when medically stable.  Expected Discharge Date:                  Expected Discharge Plan:  Skilled Nursing Facility  In-House Referral:  Clinical Social Work  Discharge planning Services  CM Consult  Post Acute Care Choice:  NA Choice offered to:  NA  DME Arranged:    DME Agency:     HH Arranged:    HH Agency:     Status of Service:  Completed, signed off  Medicare Important Message Given:    Date Medicare IM Given:    Medicare IM give by:    Date Additional Medicare IM Given:    Additional Medicare Important Message give by:     If discussed at Long Length of Stay Meetings, dates discussed:    Additional Comments:  Cheryl FlashBlackwell, Lundyn Coste Crowder, RN 05/15/2015, 11:37 AM

## 2015-05-15 NOTE — Progress Notes (Signed)
ANTIBIOTIC CONSULT NOTE -Follow up Pharmacy Consult for Vancomycin & Azactam Indication: pneumonia  Allergies  Allergen Reactions  . Aricept [Donepezil Hcl]   . Bactrim [Sulfamethoxazole-Trimethoprim]   . Ciprofloxacin   . Darvocet [Propoxyphene N-Acetaminophen]   . Namenda [Memantine Hcl]   . Penicillins   . Vibramycin [Doxycycline Calcium]     Patient Measurements: Height:  (149.9 cm) Weight: 97 lb (43.999 kg) IBW/kg (Calculated) : 43.2 Adjusted Body Weight:   Vital Signs: Temp: 97.5 F (36.4 C) (12/26 0645) Temp Source: Oral (12/26 0645) BP: 142/57 mmHg (12/26 0645) Pulse Rate: 87 (12/26 0645) Intake/Output from previous day:   Intake/Output from this shift:    Labs:  Recent Labs  05/14/15 0923 05/15/15 0641  WBC 18.8* 17.8*  HGB 9.0* 7.5*  PLT 247 231  CREATININE 1.37* 1.19*   Estimated Creatinine Clearance: 21.4 mL/min (by C-G formula based on Cr of 1.19). No results for input(s): VANCOTROUGH, VANCOPEAK, VANCORANDOM, GENTTROUGH, GENTPEAK, GENTRANDOM, TOBRATROUGH, TOBRAPEAK, TOBRARND, AMIKACINPEAK, AMIKACINTROU, AMIKACIN in the last 72 hours.   Microbiology: Recent Results (from the past 720 hour(s))  Urine culture     Status: None   Collection Time: 05/07/15  9:29 PM  Result Value Ref Range Status   Specimen Description URINE, CATHETERIZED  Final   Special Requests NONE  Final   Culture   Final    NO GROWTH 1 DAY Performed at [facility]    Report Status 05/09/2015 FINAL  Final  Culture, blood (routine x 2)     Status: None   Collection Time: 05/07/15 11:36 PM  Result Value Ref Range Status   Specimen Description BLOOD LEFT ANTECUBITAL  Final   Special Requests BOTTLES DRAWN AEROBIC AND ANAEROBIC 6CC EACH  Final   Culture NO GROWTH 5 DAYS  Final   Report Status 05/13/2015 FINAL  Final  Culture, blood (routine x 2)     Status: None   Collection Time: 05/07/15 11:46 PM  Result Value Ref Range Status   Specimen Description BLOOD LEFT  ANTECUBITAL  Final   Special Requests BOTTLES DRAWN AEROBIC AND ANAEROBIC 8CC EACH  Final   Culture NO GROWTH 5 DAYS  Final   Report Status 05/13/2015 FINAL  Final  MRSA PCR Screening     Status: None   Collection Time: 05/09/15 11:05 AM  Result Value Ref Range Status   MRSA by PCR NEGATIVE NEGATIVE Final    Comment:        The GeneXpert MRSA Assay (FDA approved for NASAL specimens only), is one component of a comprehensive MRSA colonization surveillance program. It is not intended to diagnose MRSA infection nor to guide or monitor treatment for MRSA infections.     Medical History: Past Medical History  Diagnosis Date  . Seizures (HCC)   . Aspiration pneumonia (HCC)   . Dysphagia   . Osteoporosis   . Hypothyroidism   . Depression   . MRSA (methicillin resistant staph aureus) culture positive   . Respiratory failure (HCC)   . Vertebral compression fracture (HCC)     Thoracic and lumbar  . History of sinus cancer 1970's    Left side  . Chronic pain   . Essential hypertension   . GERD (gastroesophageal reflux disease)   . DNR (do not resuscitate)     Medications:  Scheduled:  . albuterol  2.5 mg Nebulization TID  . aztreonam  1 g Intravenous Q8H  . calcium-vitamin D  1 tablet Oral TID  . docusate sodium  100 mg Oral BID  . escitalopram  10 mg Oral Daily  . fentaNYL  50 mcg Transdermal QODAY  . levETIRAcetam  250 mg Oral Q12H  . levothyroxine  175 mcg Oral QAC breakfast  . loratadine  10 mg Oral Daily  . methylPREDNISolone (SOLU-MEDROL) injection  40 mg Intravenous Q6H  . metoprolol tartrate  25 mg Oral Daily  . multivitamin with minerals  1 tablet Oral Daily  . rOPINIRole  4 mg Oral QHS  . senna-docusate  1 tablet Oral BID  . vancomycin  500 mg Intravenous Q24H   Assessment: 81 yo female ED patient, from [facility], increasing respiratory distress over past 1-2 days. Reduced renal function, slight improvement to CrCL 21.4 ml/min   Goal of Therapy:   Vancomycin trough level 15-20 mcg/ml  Plan:  Change Vancomycin 500 mg IV every 24 hours Continue Azactam 1 GM IV every 8 hours Monitor renal function Vancomycin trough at steady state  Delbert Vu Bennett 05/15/2015,10:10 AM

## 2015-05-15 NOTE — Clinical Social Work Note (Signed)
Clinical Social Work Assessment  Patient Details  Name: [name] MRN: [id number] Date of Birth: 09-10-1922  Date of referral:  05/15/15               Reason for consult:  Discharge Planning                Permission sought to share information with:    Permission granted to share information::     Name::        Agency::     Relationship::     Contact Information:     Housing/Transportation Living arrangements for the past 2 months:  Skilled Nursing Facility Source of Information:  Adult Children Patient Interpreter Needed:  None Criminal Activity/Legal Involvement Pertinent to Current Situation/Hospitalization:  No - Comment as needed Significant Relationships:  Adult Children Lives with:  Facility Resident Do you feel safe going back to the place where you live?  Yes Need for family participation in patient care:  Yes (Comment)  Care giving concerns:  [facility]    Social Worker assessment / plan:  CSW attempted to meet with pt at bedside, but pt sleeping. CSW spoke with pt's daughter, Tobe SosGay Kaiser. Pt is very well known to CSW and was just in hospital last week. She returned due to SOB and increased congestion and is admitted with pneumonia. Pt also fell Friday night at SNF. [facility] Family is very involved and supportive. [name] when they return to work regarding bed hold. Per Alvis Lemmingsawn, supervisor in charge, pt okay to return. Anticipate no need for FL2, but will confirm tomorrow with Child psychotherapistsocial worker.   Employment status:  Retired Health and safety inspectornsurance information:  Medicare PT Recommendations:  Not assessed at this time Information / Referral to community resources:  Other (Comment Required) ([facility]  Patient/Family's Response to care:  [facility]   Patient/Family's Understanding of and Emotional Response to Diagnosis, Current Treatment, and  Prognosis:  Pt's daughter is very aware of medical history and states that she returned to hospital due to worsening pneumonia. She is concerned that pt may have broken something with fall last week and plans to discuss with RN or MD today.   Emotional Assessment Appearance:  Appears stated age Attitude/Demeanor/Rapport:  Unable to Assess Affect (typically observed):  Unable to Assess Orientation:    Alcohol / Substance use:  Not Applicable Psych involvement (Current and /or in the community):  No (Comment)  Discharge Needs  Concerns to be addressed:  Discharge Planning Concerns Readmission within the last 30 days:  No Current discharge risk:  None Barriers to Discharge:  Continued Medical Work up   Karn CassisStultz, Madell Heino [place], LCSW 05/15/2015, 9:18 AM [phone]

## 2015-05-16 DIAGNOSIS — Z7189 Other specified counseling: Secondary | ICD-10-CM | POA: Insufficient documentation

## 2015-05-16 DIAGNOSIS — J9621 Acute and chronic respiratory failure with hypoxia: Secondary | ICD-10-CM

## 2015-05-16 DIAGNOSIS — J189 Pneumonia, unspecified organism: Secondary | ICD-10-CM

## 2015-05-16 DIAGNOSIS — J690 Pneumonitis due to inhalation of food and vomit: Principal | ICD-10-CM

## 2015-05-16 DIAGNOSIS — Z515 Encounter for palliative care: Secondary | ICD-10-CM | POA: Diagnosis not present

## 2015-05-16 DIAGNOSIS — M810 Age-related osteoporosis without current pathological fracture: Secondary | ICD-10-CM

## 2015-05-16 LAB — BASIC METABOLIC PANEL
Anion gap: 6 (ref 5–15)
BUN: 42 mg/dL — AB (ref 6–20)
CALCIUM: 9.9 mg/dL (ref 8.9–10.3)
CO2: 34 mmol/L — ABNORMAL HIGH (ref 22–32)
CREATININE: 1.11 mg/dL — AB (ref 0.44–1.00)
Chloride: 102 mmol/L (ref 101–111)
GFR calc Af Amer: 49 mL/min — ABNORMAL LOW (ref >60)
GFR, EST NON AFRICAN AMERICAN: 42 mL/min — AB (ref >60)
GLUCOSE: 112 mg/dL — AB (ref 65–99)
POTASSIUM: 3.9 mmol/L (ref 3.5–5.1)
Sodium: 142 mmol/L (ref 135–145)

## 2015-05-16 LAB — CBC
HCT: 25.9 % — ABNORMAL LOW (ref 36.0–46.0)
Hemoglobin: 8.3 g/dL — ABNORMAL LOW (ref 12.0–15.0)
MCH: 30.4 pg (ref 26.0–34.0)
MCHC: 32 g/dL (ref 30.0–36.0)
MCV: 94.9 fL (ref 78.0–100.0)
PLATELETS: 308 10*3/uL (ref 150–400)
RBC: 2.73 MIL/uL — ABNORMAL LOW (ref 3.87–5.11)
RDW: 16.2 % — AB (ref 11.5–15.5)
WBC: 17.9 10*3/uL — AB (ref 4.0–10.5)

## 2015-05-16 MED ORDER — SENNOSIDES 8.8 MG/5ML PO SYRP
5.0000 mL | ORAL_SOLUTION | Freq: Two times a day (BID) | ORAL | Status: DC
  Administered 2015-05-16 – 2015-05-18 (×5): 5 mL via ORAL
  Filled 2015-05-16 (×7): qty 5

## 2015-05-16 MED ORDER — DOCUSATE SODIUM 50 MG/5ML PO LIQD: 100.0000 mg | Freq: Two times a day (BID) | ORAL | Status: DC

## 2015-05-16 MED ORDER — LEVETIRACETAM 100 MG/ML PO SOLN
250.0000 mg | Freq: Two times a day (BID) | ORAL | Status: DC
  Administered 2015-05-16 – 2015-05-18 (×5): 250 mg via ORAL
  Filled 2015-05-16 (×7): qty 2.5

## 2015-05-16 NOTE — Clinical Social Work Note (Signed)
[facility] would not need a FL2.    Annice NeedySettle, Nayab Aten D, [place] [phone]

## 2015-05-16 NOTE — Consult Note (Signed)
Consultation Note Date: 05/16/2015   Patient Name: [name]  DOB: 13-Apr-1923  MRN: [id number]  Age / Sex: 81 y.o., female  PCP: [name] BaarsEdward Hawkins, MD Referring Physician: Kari BaarsEdward Hawkins, MD  Reason for Consultation: Establishing goals of care and Psychosocial/spiritual support    Clinical Assessment/Narrative: Mrs. [name] is resting quietly in bed.  She asks for a sip of water and coughs afterwards.  She tells me that she has pain at her sternum, asking if she is bruised.  I share that this may be her PNE, she states, "really? That can kill people".  I share that it does, sometimes.  She asks me to turn down the lighting in her room.   Call to daughter/HCPOA [name], and she and her husband [name] CordsBruce come to the hospital for a meeting.   They share that Mrs. [name] has been in the SNF for 10-11 years, and 6 times they have been called to bedside because she was thought to be dying.  We talk about [name] chronic illnesses, and her disease trajectory.  We discuss the chronic illness trajectory ([name] states "that's her").   We also talk about her recurrent aspiration PNE, labs, chest xray, medications, expected testing tomorrow, and the return of their trusted family doctor, [name] tomorrow.   [name] and [name] share that Mrs. [name] told them "I'm so tired", and asks "What did I do wrong to deserve this?".  [name] states, "she has no quality".    We talk about tube feedings ( no longer reccommended for aspiration as people continue to aspirate, no more by mouth feedings, and body image disturbance).  We talk about continuing to feed her by mouth, understanding that she will likely continue to aspirate.  I share the MOST form and we discuss this in detail.    We discuss the concepts of do not treat the next infection or illness (use of IV fluids/antibiotics).   [name] states this would be difficult for her because of [name]'s  alertness.  [name] and [name] talk about the passing of his father in the hospital and their focus on comfort.   We discuss what comfort measures would look like.   [name] shares that her goal is for [name] to be transferred home [name] as soon as possible, continuing course of treatment.  She voices multiple times that she does not want her mother to suffer and that she is concerned about recurrent health problems. [name] states, "I think she's ready to go."  She shares that she needs to talk with her sister, and states she would like for her sister to also talk with palliative medicine team.  I encourage her to call at any time, even after she is transferred home [name]   Contacts/Participants in Discussion: Daughter, [name] and SIL [name] Kaiser Primary Decision Maker: [name] is Oncologistprimary decision maker, she shares that she makes 80-90% of health decisions for her mother.    Relationship to Patient Daughter HCPOA: yes, Daughter, [name]  SUMMARY OF RECOMMENDATIONS  Code Status/Advance Care Planning: DNR    Code Status Orders        Start     Ordered   05/14/15 1416  Do not attempt resuscitation (DNR)   Continuous    Question Answer Comment  In the event of cardiac or respiratory ARREST Do not call a "code blue"   In the event of cardiac or respiratory ARREST Do not perform Intubation, CPR, defibrillation or ACLS  In the event of cardiac or respiratory ARREST Use medication by any route, position, wound care, and other measures to relive pain and suffering. May use oxygen, suction and manual treatment of airway obstruction as needed for comfort.      05/14/15 1415    Advance Directive Documentation        Most Recent Value   Type of Advance Directive  Out of facility DNR (pink MOST or yellow form)   Pre-existing out of facility DNR order (yellow form or pink MOST form)      "MOST" Form in Place?        Other Directives:MOST Form discussed today.   Symptom Management:   Duragesic  50 mcg TD Q 72 hours  Palliative Prophylaxis:   Aspiration and Bowel Regimen, Senakot 8.8 mg BID  Additional Recommendations (Limitations, Scope, Preferences):  We discuss the concepts of do not treat the nexdt infection/illness, do not rehospitalize.   Psycho-social/Spiritual:  Support System: Strong Desire for further Chaplaincy support:Not today.  Additional Recommendations: Caregiving  Support/Resources  Prognosis: Unable to determine, dependant on outcomes.   Discharge Planning: Skilled Nursing Facility for rehab with Palliative care service follow-up   Chief Complaint/ Primary Diagnoses: Present on Admission:  . Pneumonia . Healthcare-associated pneumonia . COPD exacerbation (HCC) . Acute on chronic respiratory failure with hypoxia (HCC) . Hypothyroidism . Osteoporosis . HCAP (healthcare-associated pneumonia)  I have reviewed the medical record, interviewed the patient and family, and examined the patient. The following aspects are pertinent.  Past Medical History  Diagnosis Date  . Seizures (HCC)   . Aspiration pneumonia (HCC)   . Dysphagia   . Osteoporosis   . Hypothyroidism   . Depression   . MRSA (methicillin resistant staph aureus) culture positive   . Respiratory failure (HCC)   . Vertebral compression fracture (HCC)     Thoracic and lumbar  . History of sinus cancer 1970's    Left side  . Chronic pain   . Essential hypertension   . GERD (gastroesophageal reflux disease)   . DNR (do not resuscitate)    Social History   Social History  . Marital Status: Widowed    Spouse Name: N/A  . Number of Children: N/A  . Years of Education: N/A   Social History Main Topics  . Smoking status: Never Smoker   . Smokeless tobacco: None  . Alcohol Use: No  . Drug Use: No  . Sexual Activity: No   Other Topics Concern  . None   Social History Narrative   Family History  Problem Relation Age of Onset  . Cancer Father    Scheduled Meds: . albuterol   2.5 mg Nebulization TID  . aztreonam  1 g Intravenous Q8H  . calcium-vitamin D  1 tablet Oral TID  . escitalopram  10 mg Oral Daily  . fentaNYL  50 mcg Transdermal QODAY  . levETIRAcetam  250 mg Oral Q12H  . levothyroxine  175 mcg Oral QAC breakfast  . loratadine  10 mg Oral Daily  . methylPREDNISolone (SOLU-MEDROL) injection  40 mg Intravenous Q6H  . metoprolol tartrate  25 mg Oral Daily  . multivitamin with minerals  1 tablet Oral Daily  . rOPINIRole  4 mg Oral QHS  . sennosides  5 mL Oral BID  . vancomycin  500 mg Intravenous Q24H   Continuous Infusions:  PRN Meds:.albuterol, ipratropium-albuterol, phenylephrine-shark liver oil-mineral oil-petrolatum Medications Prior to Admission:  Prior to Admission medications   Medication Sig Start Date End  Date Taking? Authorizing Provider  alum & mag hydroxide-simeth (MYLANTA) 200-200-20 MG/5ML suspension Take 30 mLs by mouth every 4 (four) hours as needed for indigestion or heartburn.   Yes Historical Provider, MD  Amino Acids-Protein Hydrolys (FEEDING SUPPLEMENT, PRO-STAT SUGAR FREE 64,) LIQD Take 30 mLs by mouth 2 (two) times daily.   Yes Historical Provider, MD  calcium-vitamin D (OSCAL WITH D) 500-200 MG-UNIT per tablet Take 1 tablet by mouth 3 (three) times daily. For osteoporosis   Yes Historical Provider, MD  clindamycin (CLEOCIN) 300 MG capsule Take 1 capsule (300 mg total) by mouth 4 (four) times daily. 05/11/15  Yes [name] Baars, MD  Cranberry-Vitamin C-Probiotic (AZO CRANBERRY PO) Take 1 tablet by mouth 2 (two) times daily.   Yes Historical Provider, MD  docusate sodium (COLACE) 100 MG capsule Take 100 mg by mouth 2 (two) times daily. For constipation   Yes Historical Provider, MD  escitalopram (LEXAPRO) 10 MG tablet Take 10 mg by mouth daily. For depression   Yes Historical Provider, MD  fentaNYL (DURAGESIC - DOSED MCG/HR) 50 MCG/HR Place 50 mcg onto the skin every other day.   Yes Historical Provider, MD  furosemide (LASIX) 20 MG  tablet Take 20 mg by mouth daily. For HTN/ edema   Yes Historical Provider, MD  HYDROcodone-acetaminophen (NORCO/VICODIN) 5-325 MG tablet Take 0.5 tablets by mouth 2 (two) times daily. 05/11/15  Yes [name] Baars, MD  ipratropium-albuterol (DUONEB) 0.5-2.5 (3) MG/3ML SOLN Take 3 mLs by nebulization every 6 (six) hours as needed (Shortness Of Breath).   Yes Historical Provider, MD  iron polysaccharides (NIFEREX) 150 MG capsule Take 150 mg by mouth daily.   Yes Historical Provider, MD  levETIRAcetam (KEPPRA) 250 MG tablet Take 250 mg by mouth every 12 (twelve) hours. For seizures   Yes Historical Provider, MD  levothyroxine (SYNTHROID, LEVOTHROID) 175 MCG tablet Take 175 mcg by mouth daily. For thyroid therapy   Yes Historical Provider, MD  loratadine (CLARITIN) 10 MG tablet Take 10 mg by mouth daily. For allergies   Yes Historical Provider, MD  magnesium hydroxide (MILK OF MAGNESIA) 400 MG/5ML suspension Take 30 mLs by mouth daily as needed for mild constipation.   Yes Historical Provider, MD  metoprolol tartrate (LOPRESSOR) 25 MG tablet Take 25 mg by mouth daily. For HTN   Yes Historical Provider, MD  Multiple Vitamin (MULTIVITAMIN WITH MINERALS) TABS Take 1 tablet by mouth daily. For nutritional supplement   Yes Historical Provider, MD  phenylephrine-shark liver oil-mineral oil-petrolatum (PREPARATION H) 0.25-3-14-71.9 % rectal ointment Place 1 application rectally as needed for hemorrhoids.   Yes Historical Provider, MD  predniSONE (DELTASONE) 10 MG tablet Take 1 tablet (10 mg total) by mouth daily with breakfast. Take 40 mg daily 3 days 303 days 203 days 103 days 05/11/15  Yes [name] Baars, MD  ranitidine (ZANTAC) 150 MG tablet Take 150 mg by mouth at bedtime.   Yes Historical Provider, MD  rOPINIRole (REQUIP) 4 MG tablet Take 4 mg by mouth at bedtime. For parkinson disease   Yes Historical Provider, MD  sennosides-docusate sodium (SENOKOT-S) 8.6-50 MG tablet Take 1 tablet by mouth 2 (two)  times daily. For constipation   Yes Historical Provider, MD  hydroxypropyl methylcellulose / hypromellose (ISOPTO TEARS / GONIOVISC) 2.5 % ophthalmic solution Place 1 drop into the right eye as needed for dry eyes.    Historical Provider, MD   Allergies  Allergen Reactions  . Aricept [Donepezil Hcl]   . Bactrim [Sulfamethoxazole-Trimethoprim]   . Ciprofloxacin   .  Darvocet [Propoxyphene N-Acetaminophen]   . Namenda [Memantine Hcl]   . Penicillins   . Vibramycin [Doxycycline Calcium]     Review of Systems  Physical Exam  Constitutional:  Frail and thin  HENT:  Head: Normocephalic.  Eyes:  Left eye patched. Sunken orbits BL  Cardiovascular: Normal rate and regular rhythm.   Respiratory: Effort normal. No respiratory distress. She exhibits tenderness.  Productive cough, increased after sipping thickened water.   GI: Soft. She exhibits no distension. There is no tenderness. There is no rebound.  Musculoskeletal:  Muscle wasting. Marked kyphosis.   Neurological: She is alert.    Vital Signs: BP 161/59 mmHg  Pulse 73  Temp(Src) 98 F (36.7 C) (Oral)  Resp 20  Ht 4\' 11"  (1.499 m)  Wt 43.999 kg (97 lb)  BMI 19.58 kg/m2  SpO2 100%  SpO2: SpO2: 100 % O2 Device:SpO2: 100 % O2 Flow Rate: .O2 Flow Rate (L/min): 4 L/min  IO: Intake/output summary:  Intake/Output Summary (Last 24 hours) at 05/16/15 1330 Last data filed at 05/16/15 1322  Gross per 24 hour  Intake    360 ml  Output    900 ml  Net   -540 ml    LBM: Last BM Date: 05/11/15 Baseline Weight: Weight: 43.999 kg (97 lb) Most recent weight: Weight: 43.999 kg (97 lb)      Palliative Assessment/Data:  Flowsheet Rows        Most Recent Value   Intake Tab    Referral Department  Hospitalist   Unit at Time of Referral  Med/Surg Unit   Palliative Care Primary Diagnosis  Pulmonary   Date Notified  05/15/15   Palliative Care Type  New Palliative care   Reason for referral  Clarify Goals of Care, Psychosocial or  Spiritual support, Advance Care Planning   Date of Admission  05/14/15   Date first seen by Palliative Care  05/16/15   # of days Palliative referral response time  1 Day(s)   # of days IP prior to Palliative referral  1   Clinical Assessment    Palliative Performance Scale Score  30%   Pain Max last 24 hours  Not able to report   Pain Min Last 24 hours  Not able to report   Dyspnea Max Last 24 Hours  Not able to report   Dyspnea Min Last 24 hours  Not able to report   Psychosocial & Spiritual Assessment    Social Work Plan of Care  Staff support   Palliative Care Outcomes    Patient/Family meeting held?  Yes   Who was at the meeting?  daughter and sil, Cardell Peach and Jacqualine Mau.    Patient/Family wishes: Interventions discontinued/not started   Mechanical Ventilation   Palliative Care follow-up planned  Yes, Facility      Additional Data Reviewed:  CBC:    Component Value Date/Time   WBC 17.9* 05/16/2015 0612   HGB 8.3* 05/16/2015 0612   HCT 25.9* 05/16/2015 0612   HCT 28.8* 08/11/2014 0745   PLT 308 05/16/2015 0612   MCV 94.9 05/16/2015 0612   NEUTROABS 17.9* 05/14/2015 0923   LYMPHSABS 0.5* 05/14/2015 0923   MONOABS 0.4 05/14/2015 0923   EOSABS 0.0 05/14/2015 0923   BASOSABS 0.0 05/14/2015 0923   Comprehensive Metabolic Panel:    Component Value Date/Time   NA 142 05/16/2015 0612   K 3.9 05/16/2015 0612   CL 102 05/16/2015 0612   CO2 34* 05/16/2015 0612   BUN 42*  05/16/2015 0612   CREATININE 1.11* 05/16/2015 0612   GLUCOSE 112* 05/16/2015 0612   CALCIUM 9.9 05/16/2015 0612   AST 15 05/15/2015 0641   ALT 14 05/15/2015 0641   ALKPHOS 63 05/15/2015 0641   BILITOT 0.3 05/15/2015 0641   PROT 6.0* 05/15/2015 0641   ALBUMIN 2.3* 05/15/2015 0641     Time In: 1315 Time Out: 1530 Time Total: 135 minutes Greater than 50%  of this time was spent counseling and coordinating care related to the above assessment and plan.  Signed by: Katheran Awe, NP  Katheran Awe, NP    05/16/2015, 1:30 PM  Please contact Palliative Medicine Team phone at [phone] for questions and concerns.

## 2015-05-16 NOTE — Progress Notes (Signed)
Subjective: Patient is awake but slow and acutely sick looking. Her breathing is improving and less labored.  Objective: Vital signs in last 24 hours: Temp:  [97.8 F (36.6 C)-98.2 F (36.8 C)] 98.2 F (36.8 C) (12/27 0602) Pulse Rate:  [67-72] 69 (12/27 0602) Resp:  [20-22] 20 (12/27 0602) BP: (130-147)/(52-65) 147/64 mmHg (12/27 0602) SpO2:  [80 %-100 %] 99 % (12/27 0602) Weight change:  Last BM Date: 05/11/15  Intake/Output from previous day: 12/26 0701 - 12/27 0700 In: 120 [P.O.:120] Out: 700 [Urine:700]  PHYSICAL EXAM General appearance: no distress, mild distress and slowed mentation Resp: diminished breath sounds bilaterally and rhonchi bilaterally Cardio: S1, S2 normal GI: soft, non-tender; bowel sounds normal; no masses,  no organomegaly Extremities: extremities normal, atraumatic, no cyanosis or edema  Lab Results:  Results for orders placed or performed during the hospital encounter of 05/14/15 (from the past 48 hour(s))  CBC with Differential/Platelet     Status: Abnormal   Collection Time: 05/14/15  9:23 AM  Result Value Ref Range   WBC 18.8 (H) 4.0 - 10.5 K/uL   RBC 3.21 (L) 3.87 - 5.11 MIL/uL   Hemoglobin 9.0 (L) 12.0 - 15.0 g/dL   HCT 30.2 (L) 36.0 - 46.0 %   MCV 94.1 78.0 - 100.0 fL   MCH 28.0 26.0 - 34.0 pg   MCHC 29.8 (L) 30.0 - 36.0 g/dL   RDW 16.5 (H) 11.5 - 15.5 %   Platelets 247 150 - 400 K/uL   Neutrophils Relative % 95 %   Neutro Abs 17.9 (H) 1.7 - 7.7 K/uL   Lymphocytes Relative 3 %   Lymphs Abs 0.5 (L) 0.7 - 4.0 K/uL   Monocytes Relative 2 %   Monocytes Absolute 0.4 0.1 - 1.0 K/uL   Eosinophils Relative 0 %   Eosinophils Absolute 0.0 0.0 - 0.7 K/uL   Basophils Relative 0 %   Basophils Absolute 0.0 0.0 - 0.1 K/uL  Basic metabolic panel     Status: Abnormal   Collection Time: 05/14/15  9:23 AM  Result Value Ref Range   Sodium 141 135 - 145 mmol/L   Potassium 3.7 3.5 - 5.1 mmol/L   Chloride 101 101 - 111 mmol/L   CO2 33 (H) 22 - 32  mmol/L   Glucose, Bld 139 (H) 65 - 99 mg/dL   BUN 34 (H) 6 - 20 mg/dL   Creatinine, Ser 1.37 (H) 0.44 - 1.00 mg/dL   Calcium 9.8 8.9 - 10.3 mg/dL   GFR calc non Af Amer 33 (L) >60 mL/min   GFR calc Af Amer 38 (L) >60 mL/min    Comment: (NOTE) The eGFR has been calculated using the CKD EPI equation. This calculation has not been validated in all clinical situations. eGFR's persistently <60 mL/min signify possible Chronic Kidney Disease.    Anion gap 7 5 - 15  Troponin I     Status: Abnormal   Collection Time: 05/14/15  9:23 AM  Result Value Ref Range   Troponin I 0.06 (H) <0.031 ng/mL    Comment:        PERSISTENTLY INCREASED TROPONIN VALUES IN THE RANGE OF 0.04-0.49 ng/mL CAN BE SEEN IN:       -UNSTABLE ANGINA       -CONGESTIVE HEART FAILURE       -MYOCARDITIS       -CHEST TRAUMA       -ARRYHTHMIAS       -LATE PRESENTING MYOCARDIAL INFARCTION       -  COPD   CLINICAL FOLLOW-UP RECOMMENDED.   Brain natriuretic peptide     Status: Abnormal   Collection Time: 05/14/15  9:23 AM  Result Value Ref Range   B Natriuretic Peptide 394.0 (H) 0.0 - 100.0 pg/mL  I-Stat CG4 Lactic Acid, ED     Status: None   Collection Time: 05/14/15  9:32 AM  Result Value Ref Range   Lactic Acid, Venous 1.62 0.5 - 2.0 mmol/L  Blood gas, arterial     Status: Abnormal   Collection Time: 05/14/15 10:30 AM  Result Value Ref Range   O2 Content 5.0 L/min   Delivery systems NASAL CANNULA    pH, Arterial 7.378 7.350 - 7.450   pCO2 arterial 55.0 (H) 35.0 - 45.0 mmHg   pO2, Arterial 80.1 80.0 - 100.0 mmHg   Bicarbonate 30.0 (H) 20.0 - 24.0 mEq/L   Acid-Base Excess 6.6 (H) 0.0 - 2.0 mmol/L   O2 Saturation 95.4 %   Collection site RIGHT RADIAL    Drawn by [phone]    Sample type ARTERIAL    Allens test (pass/fail) PASS PASS  TSH     Status: None   Collection Time: 05/14/15  2:39 PM  Result Value Ref Range   TSH 3.470 0.350 - 4.500 uIU/mL  CBC     Status: Abnormal   Collection Time: 05/15/15  6:41 AM   Result Value Ref Range   WBC 17.8 (H) 4.0 - 10.5 K/uL   RBC 2.60 (L) 3.87 - 5.11 MIL/uL   Hemoglobin 7.5 (L) 12.0 - 15.0 g/dL   HCT 24.3 (L) 36.0 - 46.0 %   MCV 93.5 78.0 - 100.0 fL   MCH 28.8 26.0 - 34.0 pg   MCHC 30.9 30.0 - 36.0 g/dL   RDW 16.3 (H) 11.5 - 15.5 %   Platelets 231 150 - 400 K/uL  Comprehensive metabolic panel     Status: Abnormal   Collection Time: 05/15/15  6:41 AM  Result Value Ref Range   Sodium 141 135 - 145 mmol/L   Potassium 4.5 3.5 - 5.1 mmol/L    Comment: DELTA CHECK NOTED   Chloride 103 101 - 111 mmol/L   CO2 35 (H) 22 - 32 mmol/L   Glucose, Bld 138 (H) 65 - 99 mg/dL   BUN 36 (H) 6 - 20 mg/dL   Creatinine, Ser 1.19 (H) 0.44 - 1.00 mg/dL   Calcium 9.5 8.9 - 10.3 mg/dL   Total Protein 6.0 (L) 6.5 - 8.1 g/dL   Albumin 2.3 (L) 3.5 - 5.0 g/dL   AST 15 15 - 41 U/L   ALT 14 14 - 54 U/L   Alkaline Phosphatase 63 38 - 126 U/L   Total Bilirubin 0.3 0.3 - 1.2 mg/dL   GFR calc non Af Amer 39 (L) >60 mL/min   GFR calc Af Amer 45 (L) >60 mL/min    Comment: (NOTE) The eGFR has been calculated using the CKD EPI equation. This calculation has not been validated in all clinical situations. eGFR's persistently <60 mL/min signify possible Chronic Kidney Disease.    Anion gap 3 (L) 5 - 15  MRSA PCR Screening     Status: None   Collection Time: 05/15/15  6:00 PM  Result Value Ref Range   MRSA by PCR NEGATIVE NEGATIVE    Comment:        The GeneXpert MRSA Assay (FDA approved for NASAL specimens only), is one component of a comprehensive MRSA colonization surveillance program. It is not intended  to diagnose MRSA infection nor to guide or monitor treatment for MRSA infections.     ABGS  Recent Labs  05/14/15 1030  PHART 7.378  PO2ART 80.1  HCO3 30.0*   CULTURES Recent Results (from the past 240 hour(s))  Urine culture     Status: None   Collection Time: 05/07/15  9:29 PM  Result Value Ref Range Status   Specimen Description URINE, CATHETERIZED   Final   Special Requests NONE  Final   Culture   Final    NO GROWTH 1 DAY Performed at [facility]    Report Status 05/09/2015 FINAL  Final  Culture, blood (routine x 2)     Status: None   Collection Time: 05/07/15 11:36 PM  Result Value Ref Range Status   Specimen Description BLOOD LEFT ANTECUBITAL  Final   Special Requests BOTTLES DRAWN AEROBIC AND ANAEROBIC [place]  Final   Culture NO GROWTH 5 DAYS  Final   Report Status 05/13/2015 FINAL  Final  Culture, blood (routine x 2)     Status: None   Collection Time: 05/07/15 11:46 PM  Result Value Ref Range Status   Specimen Description BLOOD LEFT ANTECUBITAL  Final   Special Requests BOTTLES DRAWN AEROBIC AND ANAEROBIC [place]  Final   Culture NO GROWTH 5 DAYS  Final   Report Status 05/13/2015 FINAL  Final  MRSA PCR Screening     Status: None   Collection Time: 05/09/15 11:05 AM  Result Value Ref Range Status   MRSA by PCR NEGATIVE NEGATIVE Final    Comment:        The GeneXpert MRSA Assay (FDA approved for NASAL specimens only), is one component of a comprehensive MRSA colonization surveillance program. It is not intended to diagnose MRSA infection nor to guide or monitor treatment for MRSA infections.   MRSA PCR Screening     Status: None   Collection Time: 05/15/15  6:00 PM  Result Value Ref Range Status   MRSA by PCR NEGATIVE NEGATIVE Final    Comment:        The GeneXpert MRSA Assay (FDA approved for NASAL specimens only), is one component of a comprehensive MRSA colonization surveillance program. It is not intended to diagnose MRSA infection nor to guide or monitor treatment for MRSA infections.    Studies/Results: Dg Chest Port 1 View  05/14/2015  CLINICAL DATA:  Gradually worsening respiratory distress for 24 hours EXAM: PORTABLE CHEST 1 VIEW COMPARISON:  May 07, 2015 FINDINGS: The heart size is enlarged. The mediastinal contour is stable. There is consolidation of the left mid and lung base.  There is left pleural effusion. The right lung is clear. The bony structures are stable. IMPRESSION: Pneumonia of the left mid and lung base with left pleural effusion. Electronically Signed   By: Abelardo Diesel M.D.   On: 05/14/2015 09:55    Medications: I have reviewed the patient's current medications.  Assesment:   Principal Problem:   Healthcare-associated pneumonia Active Problems:   HCAP (healthcare-associated pneumonia)   Hypothyroidism   Osteoporosis   COPD exacerbation ([place])   Acute on chronic respiratory failure with hypoxia ([place])   Seizure disorder ([place])   Pneumonia    Plan:  Medications reviewed Continue iv antibiotics Will monitor CBC/BMP Continue current treatment    LOS: 2 days   Tiara Bartoli 05/16/2015, 7:38 AM

## 2015-05-17 LAB — BASIC METABOLIC PANEL
ANION GAP: 4 — AB (ref 5–15)
BUN: 38 mg/dL — AB (ref 6–20)
CALCIUM: 9.7 mg/dL (ref 8.9–10.3)
CO2: 37 mmol/L — ABNORMAL HIGH (ref 22–32)
CREATININE: 0.95 mg/dL (ref 0.44–1.00)
Chloride: 100 mmol/L — ABNORMAL LOW (ref 101–111)
GFR calc non Af Amer: 51 mL/min — ABNORMAL LOW (ref >60)
GFR, EST AFRICAN AMERICAN: 59 mL/min — AB (ref >60)
GLUCOSE: 112 mg/dL — AB (ref 65–99)
Potassium: 4.2 mmol/L (ref 3.5–5.1)
SODIUM: 141 mmol/L (ref 135–145)

## 2015-05-17 LAB — CBC
HCT: 26.2 % — ABNORMAL LOW (ref 36.0–46.0)
Hemoglobin: 7.9 g/dL — ABNORMAL LOW (ref 12.0–15.0)
MCH: 28.2 pg (ref 26.0–34.0)
MCHC: 30.2 g/dL (ref 30.0–36.0)
MCV: 93.6 fL (ref 78.0–100.0)
PLATELETS: 342 10*3/uL (ref 150–400)
RBC: 2.8 MIL/uL — AB (ref 3.87–5.11)
RDW: 15.7 % — ABNORMAL HIGH (ref 11.5–15.5)
WBC: 17.9 10*3/uL — ABNORMAL HIGH (ref 4.0–10.5)

## 2015-05-17 NOTE — Progress Notes (Signed)
Daily Progress Note   Patient Name: [name]       Date: 05/17/2015 DOB: 09/04/1922  Age: 81 y.o. MRN#: [id number] Attending Physician: Kari Baars, MD Primary Care Physician: Fredirick Maudlin, MD Admit Date: 05/14/2015  Reason for Consultation/Follow-up: Establishing goals of care and Psychosocial/spiritual support  Subjective: [name] is resting quietly in bed.  She greets me as I enter, and makes and keeps eye contact.   I give her a sip of thickened apple juice and she coughs afterwards.   She tells me that she is having pain at her sternum.  We talk about her recent falls, aspiration, and PNE.   She asks about aspiration PNE and I share that we are treating her for this now.  She tells me that she wants to be treated.  [name] coughs and clears her throat, this is likely d/t recent sip of thickened apple juice.   [name] asks me to help arrange her feet/legs and she seems comfortable when I leave.    Call to daughter Tobe Sos, we talk about the current treatment plan.  [name] shares her meeting with Dr. Juanetta Gosling and "the beginning of her end", d/t aspiration.  I share that Mrs. Delcid told me that she wants to continue treatments at this time.  We talk about getting PICC line placed and back to [facility] on bedrest as soon as she is ready.  We also talk about WBC staying the same at 17.9, but Mrs. Smotherman telling her daughters that she feels better and hopes the IV antibiotics will make her well.  I encourage [name] to reach out or visit at any time.   Length of Stay: 3 days  Current Medications: Scheduled Meds:  . albuterol  2.5 mg Nebulization TID  . aztreonam  1 g Intravenous Q8H  . calcium-vitamin D  1 tablet Oral TID  . escitalopram  10 mg Oral Daily  . fentaNYL  50 mcg  Transdermal QODAY  . levETIRAcetam  250 mg Oral Q12H  . levothyroxine  175 mcg Oral QAC breakfast  . loratadine  10 mg Oral Daily  . methylPREDNISolone (SOLU-MEDROL) injection  40 mg Intravenous Q6H  . metoprolol tartrate  25 mg Oral Daily  . multivitamin with minerals  1 tablet Oral Daily  . rOPINIRole  4  mg Oral QHS  . sennosides  5 mL Oral BID  . vancomycin  500 mg Intravenous Q24H    Continuous Infusions:    PRN Meds: albuterol, ipratropium-albuterol, phenylephrine-shark liver oil-mineral oil-petrolatum  Physical Exam: Physical Exam  Constitutional:  Frail, elderly. Makes and keeps eye contact.   HENT:  Left side facial droop d/t cranofacial surgery. L eye patched.   Cardiovascular: Normal rate and regular rhythm.   Pulmonary/Chest: Effort normal. No respiratory distress. She has rales. She exhibits tenderness.  Abdominal: Soft. Bowel sounds are normal. There is no tenderness. There is no guarding.  Neurological: She is alert.  Skin: Skin is warm and dry.  Psychiatric: Her behavior is normal.                Vital Signs: BP 164/66 mmHg  Pulse 92  Temp(Src) 97.2 F (36.2 C) (Oral)  Resp 16  Ht  (1.499 m)  Wt 43.999 kg (97 lb)  BMI 19.58 kg/m2  SpO2 90% SpO2: SpO2: 90 % O2 Device: O2 Device: Nasal Cannula O2 Flow Rate: O2 Flow Rate (L/min): 4 L/min  Intake/output summary:  Intake/Output Summary (Last 24 hours) at 05/17/15 1148 Last data filed at 05/17/15 0900  Gross per 24 hour  Intake    600 ml  Output   1050 ml  Net   -450 ml   LBM: Last BM Date: 05/11/15 Baseline Weight: Weight: 43.999 kg (97 lb) Most recent weight: Weight: 43.999 kg (97 lb)       Palliative Assessment/Data: Flowsheet Rows        Most Recent Value   Intake Tab    Referral Department  Hospitalist   Unit at Time of Referral  Med/Surg Unit   Palliative Care Primary Diagnosis  Pulmonary   Date Notified  05/15/15   Palliative Care Type  New Palliative care   Reason for referral   Clarify Goals of Care, Psychosocial or Spiritual support, Advance Care Planning   Date of Admission  05/14/15   Date first seen by Palliative Care  05/16/15   # of days Palliative referral response time  1 Day(s)   # of days IP prior to Palliative referral  1   Clinical Assessment    Palliative Performance Scale Score  30%   Pain Max last 24 hours  Not able to report   Pain Min Last 24 hours  Not able to report   Dyspnea Max Last 24 Hours  Not able to report   Dyspnea Min Last 24 hours  Not able to report   Psychosocial & Spiritual Assessment    Social Work Plan of Care  Staff support   Palliative Care Outcomes    Patient/Family meeting held?  Yes   Who was at the meeting?  daughter and sil, Cardell Peach and Jacqualine Mau.    Patient/Family wishes: Interventions discontinued/not started   Mechanical Ventilation   Palliative Care follow-up planned  Yes, Facility      Additional Data Reviewed: CBC    Component Value Date/Time   WBC 17.9* 05/17/2015 0557   RBC 2.80* 05/17/2015 0557   RBC 3.06* 04/27/2015 1607   HGB 7.9* 05/17/2015 0557   HCT 26.2* 05/17/2015 0557   HCT 28.8* 08/11/2014 0745   PLT 342 05/17/2015 0557   MCV 93.6 05/17/2015 0557   MCH 28.2 05/17/2015 0557   MCHC 30.2 05/17/2015 0557   RDW 15.7* 05/17/2015 0557   LYMPHSABS 0.5* 05/14/2015 0923   MONOABS 0.4 05/14/2015 0923   EOSABS  0.0 05/14/2015 0923   BASOSABS 0.0 05/14/2015 0923    CMP     Component Value Date/Time   NA 141 05/17/2015 0557   K 4.2 05/17/2015 0557   CL 100* 05/17/2015 0557   CO2 37* 05/17/2015 0557   GLUCOSE 112* 05/17/2015 0557   BUN 38* 05/17/2015 0557   CREATININE 0.95 05/17/2015 0557   CALCIUM 9.7 05/17/2015 0557   PROT 6.0* 05/15/2015 0641   ALBUMIN 2.3* 05/15/2015 0641   AST 15 05/15/2015 0641   ALT 14 05/15/2015 0641   ALKPHOS 63 05/15/2015 0641   BILITOT 0.3 05/15/2015 0641   GFRNONAA 51* 05/17/2015 0557   GFRAA 59* 05/17/2015 0557       Problem List:  Patient Active Problem  List   Diagnosis Date Noted  . Palliative care encounter   . DNR (do not resuscitate) discussion   . Aspiration pneumonia of left lung (HCC)   . Pneumonia 05/14/2015  . Pressure ulcer stage II 05/10/2015  . Seizure disorder (HCC) 05/10/2015  . Elevated troponin 05/08/2015  . Pressure ulcer 05/08/2015  . Anemia 04/27/2015  . Protein-calorie malnutrition, severe (HCC) 09/13/2014  . Healthcare-associated pneumonia 09/10/2014  . Essential hypertension 09/10/2014  . Insomnia 09/10/2014  . Protein calorie malnutrition (HCC) 09/10/2014  . Stasis leg ulcer (HCC) 08/15/2014  . COPD exacerbation (HCC) 08/12/2014  . Acute on chronic respiratory failure with hypercapnia (HCC) 08/12/2014  . Acute on chronic respiratory failure with hypoxia (HCC) 08/12/2014  . Convulsions/seizures (HCC) 03/30/2014  . Hypothyroidism 03/30/2014  . Osteoporosis 03/30/2014  . Depression 03/30/2014  . Hospital acquired PNA 06/09/2013  . HCAP (healthcare-associated pneumonia) 06/09/2013     Palliative Care Assessment & Plan    1.Code Status:  DNR    Code Status Orders        Start     Ordered   05/14/15 1416  Do not attempt resuscitation (DNR)   Continuous    Question Answer Comment  In the event of cardiac or respiratory ARREST Do not call a "code blue"   In the event of cardiac or respiratory ARREST Do not perform Intubation, CPR, defibrillation or ACLS   In the event of cardiac or respiratory ARREST Use medication by any route, position, wound care, and other measures to relive pain and suffering. May use oxygen, suction and manual treatment of airway obstruction as needed for comfort.      05/14/15 1415    Advance Directive Documentation        Most Recent Value   Type of Advance Directive  Out of facility DNR (pink MOST or yellow form)   Pre-existing out of facility DNR order (yellow form or pink MOST form)     "MOST" Form in Place?         2. Goals of Care/Additional  Recommendations:  Treat this illness/infection.  Determine use of IV antibiotics and fluids in the future on a case by case need. Family is aware of the likelihood of recurrent aspiration PNE.   Limitations on Scope of Treatment: as above  Desire for further Chaplaincy support:  Not discussed today.   Psycho-social Needs: Caregiving  Support/Resources  3. Symptom Management:      1.Duragesic 50 mcg TD Q 72 hours  4. Palliative Prophylaxis:   Aspiration and Bowel Regimen, Senakot 8.8 mg BID.  5. Prognosis: Unable to determine, likely less than 6 months d/t recurrent aspiration. Possibly less than 1 month d/t current infection.   6. Discharge Planning:  Skilled Nursing  Facility for rehab with Palliative care service follow-up   Care plan was discussed with nursing staff, CM, SW, and Dr. Juanetta GoslingHawkins.   Thank you for allowing the Palliative Medicine Team to assist in the care of this patient.   Time In: 0840 Time Out: 0910 Total Time 30 minutes Prolonged Time Billed  no         Katheran Aweasha A Dove, NP  05/17/2015, 11:48 AM  Please contact Palliative Medicine Team phone at [phone] for questions and concerns.

## 2015-05-17 NOTE — Progress Notes (Signed)
Subjective: She was admitted with another episode of aspiration pneumonia. Palliative care consultation has been requested and is underway. I discussed her situation with her daughters and told her that I think this is going to be an ongoing problem and that a feeding tube will not help.  Objective: Vital signs in last 24 hours: Temp:  [97.2 F (36.2 C)-98 F (36.7 C)] 97.2 F (36.2 C) (12/27 2016) Pulse Rate:  [73-92] 92 (12/27 2016) Resp:  [16-20] 16 (12/27 2016) BP: (161-164)/(59-66) 164/66 mmHg (12/27 2016) SpO2:  [88 %-100 %] 90 % (12/28 0855) Weight change:  Last BM Date: 05/11/15  Intake/Output from previous day: 12/27 0701 - 12/28 0700 In: 600 [P.O.:600] Out: 1050 [Urine:1050]  PHYSICAL EXAM General appearance: alert, cooperative and no distress Resp: rhonchi bilaterally Cardio: regular rate and rhythm, S1, S2 normal, no murmur, click, rub or gallop GI: soft, non-tender; bowel sounds normal; no masses,  no organomegaly Extremities: extremities normal, atraumatic, no cyanosis or edema  Lab Results:  Results for orders placed or performed during the hospital encounter of 05/14/15 (from the past 48 hour(s))  MRSA PCR Screening     Status: None   Collection Time: 05/15/15  6:00 PM  Result Value Ref Range   MRSA by PCR NEGATIVE NEGATIVE    Comment:        The GeneXpert MRSA Assay (FDA approved for NASAL specimens only), is one component of a comprehensive MRSA colonization surveillance program. It is not intended to diagnose MRSA infection nor to guide or monitor treatment for MRSA infections.   Basic metabolic panel     Status: Abnormal   Collection Time: 05/16/15  6:12 AM  Result Value Ref Range   Sodium 142 135 - 145 mmol/L   Potassium 3.9 3.5 - 5.1 mmol/L   Chloride 102 101 - 111 mmol/L   CO2 34 (H) 22 - 32 mmol/L   Glucose, Bld 112 (H) 65 - 99 mg/dL   BUN 42 (H) 6 - 20 mg/dL   Creatinine, Ser 1.11 (H) 0.44 - 1.00 mg/dL   Calcium 9.9 8.9 - 10.3 mg/dL    GFR calc non Af Amer 42 (L) >60 mL/min   GFR calc Af Amer 49 (L) >60 mL/min    Comment: (NOTE) The eGFR has been calculated using the CKD EPI equation. This calculation has not been validated in all clinical situations. eGFR's persistently <60 mL/min signify possible Chronic Kidney Disease.    Anion gap 6 5 - 15  CBC     Status: Abnormal   Collection Time: 05/16/15  6:12 AM  Result Value Ref Range   WBC 17.9 (H) 4.0 - 10.5 K/uL   RBC 2.73 (L) 3.87 - 5.11 MIL/uL   Hemoglobin 8.3 (L) 12.0 - 15.0 g/dL   HCT 25.9 (L) 36.0 - 46.0 %   MCV 94.9 78.0 - 100.0 fL   MCH 30.4 26.0 - 34.0 pg   MCHC 32.0 30.0 - 36.0 g/dL   RDW 16.2 (H) 11.5 - 15.5 %   Platelets 308 150 - 400 K/uL  Basic metabolic panel     Status: Abnormal   Collection Time: 05/17/15  5:57 AM  Result Value Ref Range   Sodium 141 135 - 145 mmol/L   Potassium 4.2 3.5 - 5.1 mmol/L   Chloride 100 (L) 101 - 111 mmol/L   CO2 37 (H) 22 - 32 mmol/L   Glucose, Bld 112 (H) 65 - 99 mg/dL   BUN 38 (H) 6 - 20 mg/dL  Creatinine, Ser 0.95 0.44 - 1.00 mg/dL   Calcium 9.7 8.9 - 10.3 mg/dL   GFR calc non Af Amer 51 (L) >60 mL/min   GFR calc Af Amer 59 (L) >60 mL/min    Comment: (NOTE) The eGFR has been calculated using the CKD EPI equation. This calculation has not been validated in all clinical situations. eGFR's persistently <60 mL/min signify possible Chronic Kidney Disease.    Anion gap 4 (L) 5 - 15  CBC     Status: Abnormal   Collection Time: 05/17/15  5:57 AM  Result Value Ref Range   WBC 17.9 (H) 4.0 - 10.5 K/uL   RBC 2.80 (L) 3.87 - 5.11 MIL/uL   Hemoglobin 7.9 (L) 12.0 - 15.0 g/dL   HCT 26.2 (L) 36.0 - 46.0 %   MCV 93.6 78.0 - 100.0 fL   MCH 28.2 26.0 - 34.0 pg   MCHC 30.2 30.0 - 36.0 g/dL   RDW 15.7 (H) 11.5 - 15.5 %   Platelets 342 150 - 400 K/uL    ABGS  Recent Labs  05/14/15 1030  PHART 7.378  PO2ART 80.1  HCO3 30.0*   CULTURES Recent Results (from the past 240 hour(s))  Urine culture     Status: None    Collection Time: 05/07/15  9:29 PM  Result Value Ref Range Status   Specimen Description URINE, CATHETERIZED  Final   Special Requests NONE  Final   Culture   Final    NO GROWTH 1 DAY Performed at [facility]    Report Status 05/09/2015 FINAL  Final  Culture, blood (routine x 2)     Status: None   Collection Time: 05/07/15 11:36 PM  Result Value Ref Range Status   Specimen Description BLOOD LEFT ANTECUBITAL  Final   Special Requests BOTTLES DRAWN AEROBIC AND ANAEROBIC [place]  Final   Culture NO GROWTH 5 DAYS  Final   Report Status 05/13/2015 FINAL  Final  Culture, blood (routine x 2)     Status: None   Collection Time: 05/07/15 11:46 PM  Result Value Ref Range Status   Specimen Description BLOOD LEFT ANTECUBITAL  Final   Special Requests BOTTLES DRAWN AEROBIC AND ANAEROBIC [place]  Final   Culture NO GROWTH 5 DAYS  Final   Report Status 05/13/2015 FINAL  Final  MRSA PCR Screening     Status: None   Collection Time: 05/09/15 11:05 AM  Result Value Ref Range Status   MRSA by PCR NEGATIVE NEGATIVE Final    Comment:        The GeneXpert MRSA Assay (FDA approved for NASAL specimens only), is one component of a comprehensive MRSA colonization surveillance program. It is not intended to diagnose MRSA infection nor to guide or monitor treatment for MRSA infections.   MRSA PCR Screening     Status: None   Collection Time: 05/15/15  6:00 PM  Result Value Ref Range Status   MRSA by PCR NEGATIVE NEGATIVE Final    Comment:        The GeneXpert MRSA Assay (FDA approved for NASAL specimens only), is one component of a comprehensive MRSA colonization surveillance program. It is not intended to diagnose MRSA infection nor to guide or monitor treatment for MRSA infections.    Studies/Results: No results found.  Medications:  Prior to Admission:  Prescriptions prior to admission  Medication Sig Dispense Refill Last Dose  . alum & mag hydroxide-simeth (MYLANTA)  200-200-20 MG/5ML suspension Take 30 mLs  by mouth every 4 (four) hours as needed for indigestion or heartburn.   05/13/2015 at Unknown time  . Amino Acids-Protein Hydrolys (FEEDING SUPPLEMENT, PRO-STAT SUGAR FREE 64,) LIQD Take 30 mLs by mouth 2 (two) times daily.   05/13/2015 at Unknown time  . calcium-vitamin D (OSCAL WITH D) 500-200 MG-UNIT per tablet Take 1 tablet by mouth 3 (three) times daily. For osteoporosis   05/13/2015 at Unknown time  . clindamycin (CLEOCIN) 300 MG capsule Take 1 capsule (300 mg total) by mouth 4 (four) times daily. 40 capsule 0 05/13/2015 at Unknown time  . Cranberry-Vitamin C-Probiotic (AZO CRANBERRY PO) Take 1 tablet by mouth 2 (two) times daily.   05/13/2015 at Unknown time  . docusate sodium (COLACE) 100 MG capsule Take 100 mg by mouth 2 (two) times daily. For constipation   05/13/2015 at Unknown time  . escitalopram (LEXAPRO) 10 MG tablet Take 10 mg by mouth daily. For depression   05/13/2015 at Unknown time  . fentaNYL (DURAGESIC - DOSED MCG/HR) 50 MCG/HR Place 50 mcg onto the skin every other day.   05/13/2015 at Unknown time  . furosemide (LASIX) 20 MG tablet Take 20 mg by mouth daily. For HTN/ edema   05/13/2015 at Unknown time  . HYDROcodone-acetaminophen (NORCO/VICODIN) 5-325 MG tablet Take 0.5 tablets by mouth 2 (two) times daily. 210 tablet 0 05/13/2015 at Unknown time  . ipratropium-albuterol (DUONEB) 0.5-2.5 (3) MG/3ML SOLN Take 3 mLs by nebulization every 6 (six) hours as needed (Shortness Of Breath).   05/13/2015 at Unknown time  . iron polysaccharides (NIFEREX) 150 MG capsule Take 150 mg by mouth daily.   05/13/2015 at Unknown time  . levETIRAcetam (KEPPRA) 250 MG tablet Take 250 mg by mouth every 12 (twelve) hours. For seizures   05/13/2015 at Unknown time  . levothyroxine (SYNTHROID, LEVOTHROID) 175 MCG tablet Take 175 mcg by mouth daily. For thyroid therapy   05/13/2015 at Unknown time  . loratadine (CLARITIN) 10 MG tablet Take 10 mg by mouth daily. For  allergies   05/13/2015 at Unknown time  . magnesium hydroxide (MILK OF MAGNESIA) 400 MG/5ML suspension Take 30 mLs by mouth daily as needed for mild constipation.   05/13/2015 at Unknown time  . metoprolol tartrate (LOPRESSOR) 25 MG tablet Take 25 mg by mouth daily. For HTN   05/13/2015 at 1000  . Multiple Vitamin (MULTIVITAMIN WITH MINERALS) TABS Take 1 tablet by mouth daily. For nutritional supplement   05/13/2015 at Unknown time  . phenylephrine-shark liver oil-mineral oil-petrolatum (PREPARATION H) 0.25-3-14-71.9 % rectal ointment Place 1 application rectally as needed for hemorrhoids.   05/13/2015 at Unknown time  . predniSONE (DELTASONE) 10 MG tablet Take 1 tablet (10 mg total) by mouth daily with breakfast. Take 40 mg daily 3 days 303 days 203 days 103 days   05/13/2015 at Unknown time  . ranitidine (ZANTAC) 150 MG tablet Take 150 mg by mouth at bedtime.   05/13/2015 at Unknown time  . rOPINIRole (REQUIP) 4 MG tablet Take 4 mg by mouth at bedtime. For parkinson disease   05/13/2015 at Unknown time  . sennosides-docusate sodium (SENOKOT-S) 8.6-50 MG tablet Take 1 tablet by mouth 2 (two) times daily. For constipation   05/13/2015 at Unknown time  . hydroxypropyl methylcellulose / hypromellose (ISOPTO TEARS / GONIOVISC) 2.5 % ophthalmic solution Place 1 drop into the right eye as needed for dry eyes.   Unknown   Scheduled: . albuterol  2.5 mg Nebulization TID  . aztreonam  1 g Intravenous  Q8H  . calcium-vitamin D  1 tablet Oral TID  . escitalopram  10 mg Oral Daily  . fentaNYL  50 mcg Transdermal QODAY  . levETIRAcetam  250 mg Oral Q12H  . levothyroxine  175 mcg Oral QAC breakfast  . loratadine  10 mg Oral Daily  . methylPREDNISolone (SOLU-MEDROL) injection  40 mg Intravenous Q6H  . metoprolol tartrate  25 mg Oral Daily  . multivitamin with minerals  1 tablet Oral Daily  . rOPINIRole  4 mg Oral QHS  . sennosides  5 mL Oral BID  . vancomycin  500 mg Intravenous Q24H   Continuous:   GPQ:DIYMEBRAX, ipratropium-albuterol, phenylephrine-shark liver oil-mineral oil-petrolatum  Assesment: She has healthcare associated pneumonia. This is recurrent. This is likely from aspiration. She has acute on chronic hypoxic respiratory failure. She has seizure disorder but no evidence of seizures. She has fallen on multiple occasions. Principal Problem:   Healthcare-associated pneumonia Active Problems:   HCAP (healthcare-associated pneumonia)   Hypothyroidism   Osteoporosis   COPD exacerbation ([place])   Acute on chronic respiratory failure with hypoxia ([place])   Seizure disorder ([place])   Pneumonia   Palliative care encounter   DNR (do not resuscitate) discussion   Aspiration pneumonia of left lung ([place])    Plan: Continue with palliative care evaluation. I think she is going to require IV antibiotics at least for now. I'm going to see about getting a PICC line in.    LOS: 3 days   Talaya Lamprecht L 05/17/2015, 9:14 AM

## 2015-05-18 ENCOUNTER — Inpatient Hospital Stay
Admission: RE | Admit: 2015-05-18 | Discharge: 2015-06-21 | Disposition: E | Payer: Medicare Other | Source: Ambulatory Visit | Attending: Internal Medicine | Admitting: Internal Medicine

## 2015-05-18 ENCOUNTER — Ambulatory Visit (HOSPITAL_COMMUNITY)
Admit: 2015-05-18 | Discharge: 2015-05-18 | Disposition: A | Discharge status: Home / Self Care | Payer: Medicare Other | Attending: Pulmonary Disease | Admitting: Pulmonary Disease

## 2015-05-18 DIAGNOSIS — J189 Pneumonia, unspecified organism: Secondary | ICD-10-CM | POA: Insufficient documentation

## 2015-05-18 MED ORDER — HEPARIN SOD (PORK) LOCK FLUSH 100 UNIT/ML IV SOLN
INTRAVENOUS | Status: AC
  Filled 2015-05-18: qty 5

## 2015-05-18 MED ORDER — LIDOCAINE HCL 1 % IJ SOLN
INTRAMUSCULAR | Status: DC
Start: 2015-05-18 — End: 2015-05-19
  Filled 2015-05-18: qty 20

## 2015-05-18 MED ORDER — HEPARIN SOD (PORK) LOCK FLUSH 100 UNIT/ML IV SOLN
250.0000 [IU] | INTRAVENOUS | Status: AC | PRN
  Administered 2015-05-18: 250 [IU]
  Filled 2015-05-18: qty 5

## 2015-05-18 MED ORDER — VANCOMYCIN HCL 500 MG IV SOLR: 500.0000 mg | INTRAVENOUS | Status: AC

## 2015-05-18 MED ORDER — DEXTROSE 5 % IV SOLN: 1.0000 g | Freq: Three times a day (TID) | INTRAVENOUS | Status: AC

## 2015-05-18 MED ORDER — DOCUSATE SODIUM 100 MG PO CAPS
100.0000 mg | ORAL_CAPSULE | Freq: Every day | ORAL | Status: DC
  Administered 2015-05-18: 100 mg via ORAL
  Filled 2015-05-18: qty 1

## 2015-05-18 NOTE — Progress Notes (Signed)
Subjective: She says she feels okay. PICC line could not be placed here. It has been requested by interventional radiology in [place] but is not scheduled yet.  Objective: Vital signs in last 24 hours: Temp:  [97.4 F (36.3 C)-98.1 F (36.7 C)] 98.1 F (36.7 C) (12/29 0500) Pulse Rate:  [77-107] 90 (12/29 0845) Resp:  [15-20] 16 (12/29 0845) BP: (134-174)/(63-80) 153/80 mmHg (12/29 0500) SpO2:  [83 %-98 %] 91 % (12/29 0845) Weight change:  Last BM Date: 05/11/15  Intake/Output from previous day: 12/28 0701 - 12/29 0700 In: 205 [P.O.:205] Out: 600 [Urine:600]  PHYSICAL EXAM General appearance: alert, cooperative and no distress Resp: Rhonchi and wheezes bilaterally Cardio: regular rate and rhythm, S1, S2 normal, no murmur, click, rub or gallop GI: soft, non-tender; bowel sounds normal; no masses,  no organomegaly Extremities: extremities normal, atraumatic, no cyanosis or edema  Lab Results:  Results for orders placed or performed during the hospital encounter of 05/14/15 (from the past 48 hour(s))  Basic metabolic panel     Status: Abnormal   Collection Time: 05/17/15  5:57 AM  Result Value Ref Range   Sodium 141 135 - 145 mmol/Kaiser   Potassium 4.2 3.5 - 5.1 mmol/Kaiser   Chloride 100 (Kaiser) 101 - 111 mmol/Kaiser   CO2 37 (H) 22 - 32 mmol/Kaiser   Glucose, Bld 112 (H) 65 - 99 mg/dL   BUN 38 (H) 6 - 20 mg/dL   Creatinine, Ser 0.95 0.44 - 1.00 mg/dL   Calcium 9.7 8.9 - 10.3 mg/dL   GFR calc non Af Amer 51 (Kaiser) >60 mL/min   GFR calc Af Amer 59 (Kaiser) >60 mL/min    Comment: (NOTE) The eGFR has been calculated using the CKD EPI equation. This calculation has not been validated in all clinical situations. eGFR's persistently <60 mL/min signify possible Chronic Kidney Disease.    Anion gap 4 (Kaiser) 5 - 15  CBC     Status: Abnormal   Collection Time: 05/17/15  5:57 AM  Result Value Ref Range   WBC 17.9 (H) 4.0 - 10.5 K/uL   RBC 2.80 (Kaiser) 3.87 - 5.11 MIL/uL   Hemoglobin 7.9 (Kaiser) 12.0 - 15.0 g/dL    HCT 26.2 (Kaiser) 36.0 - 46.0 %   MCV 93.6 78.0 - 100.0 fL   MCH 28.2 26.0 - 34.0 pg   MCHC 30.2 30.0 - 36.0 g/dL   RDW 15.7 (H) 11.5 - 15.5 %   Platelets 342 150 - 400 K/uL    ABGS No results for input(s): PHART, PO2ART, TCO2, HCO3 in the last 72 hours.  Invalid input(s): PCO2 CULTURES Recent Results (from the past 240 hour(s))  MRSA PCR Screening     Status: None   Collection Time: 05/09/15 11:05 AM  Result Value Ref Range Status   MRSA by PCR NEGATIVE NEGATIVE Final    Comment:        The GeneXpert MRSA Assay (FDA approved for NASAL specimens only), is one component of a comprehensive MRSA colonization surveillance program. It is not intended to diagnose MRSA infection nor to guide or monitor treatment for MRSA infections.   MRSA PCR Screening     Status: None   Collection Time: 05/15/15  6:00 PM  Result Value Ref Range Status   MRSA by PCR NEGATIVE NEGATIVE Final    Comment:        The GeneXpert MRSA Assay (FDA approved for NASAL specimens only), is one component of a comprehensive MRSA colonization surveillance program. It  is not intended to diagnose MRSA infection nor to guide or monitor treatment for MRSA infections.    Studies/Results: No results found.  Medications:  Prior to Admission:  Prescriptions prior to admission  Medication Sig Dispense Refill Last Dose  . alum & mag hydroxide-simeth (MYLANTA) 200-200-20 MG/5ML suspension Take 30 mLs by mouth every 4 (four) hours as needed for indigestion or heartburn.   05/13/2015 at Unknown time  . Amino Acids-Protein Hydrolys (FEEDING SUPPLEMENT, PRO-STAT SUGAR FREE 64,) LIQD Take 30 mLs by mouth 2 (two) times daily.   05/13/2015 at Unknown time  . calcium-vitamin D (OSCAL WITH D) 500-200 MG-UNIT per tablet Take 1 tablet by mouth 3 (three) times daily. For osteoporosis   05/13/2015 at Unknown time  . clindamycin (CLEOCIN) 300 MG capsule Take 1 capsule (300 mg total) by mouth 4 (four) times daily. 40 capsule 0  05/13/2015 at Unknown time  . Cranberry-Vitamin C-Probiotic (AZO CRANBERRY PO) Take 1 tablet by mouth 2 (two) times daily.   05/13/2015 at Unknown time  . docusate sodium (COLACE) 100 MG capsule Take 100 mg by mouth 2 (two) times daily. For constipation   05/13/2015 at Unknown time  . escitalopram (LEXAPRO) 10 MG tablet Take 10 mg by mouth daily. For depression   05/13/2015 at Unknown time  . fentaNYL (DURAGESIC - DOSED MCG/HR) 50 MCG/HR Place 50 mcg onto the skin every other day.   05/13/2015 at Unknown time  . furosemide (LASIX) 20 MG tablet Take 20 mg by mouth daily. For HTN/ edema   05/13/2015 at Unknown time  . HYDROcodone-acetaminophen (NORCO/VICODIN) 5-325 MG tablet Take 0.5 tablets by mouth 2 (two) times daily. 210 tablet 0 05/13/2015 at Unknown time  . ipratropium-albuterol (DUONEB) 0.5-2.5 (3) MG/3ML SOLN Take 3 mLs by nebulization every 6 (six) hours as needed (Shortness Of Breath).   05/13/2015 at Unknown time  . iron polysaccharides (NIFEREX) 150 MG capsule Take 150 mg by mouth daily.   05/13/2015 at Unknown time  . levETIRAcetam (KEPPRA) 250 MG tablet Take 250 mg by mouth every 12 (twelve) hours. For seizures   05/13/2015 at Unknown time  . levothyroxine (SYNTHROID, LEVOTHROID) 175 MCG tablet Take 175 mcg by mouth daily. For thyroid therapy   05/13/2015 at Unknown time  . loratadine (CLARITIN) 10 MG tablet Take 10 mg by mouth daily. For allergies   05/13/2015 at Unknown time  . magnesium hydroxide (MILK OF MAGNESIA) 400 MG/5ML suspension Take 30 mLs by mouth daily as needed for mild constipation.   05/13/2015 at Unknown time  . metoprolol tartrate (LOPRESSOR) 25 MG tablet Take 25 mg by mouth daily. For HTN   05/13/2015 at 1000  . Multiple Vitamin (MULTIVITAMIN WITH MINERALS) TABS Take 1 tablet by mouth daily. For nutritional supplement   05/13/2015 at Unknown time  . phenylephrine-shark liver oil-mineral oil-petrolatum (PREPARATION H) 0.25-3-14-71.9 % rectal ointment Place 1 application  rectally as needed for hemorrhoids.   05/13/2015 at Unknown time  . predniSONE (DELTASONE) 10 MG tablet Take 1 tablet (10 mg total) by mouth daily with breakfast. Take 40 mg daily 3 days 303 days 203 days 103 days   05/13/2015 at Unknown time  . ranitidine (ZANTAC) 150 MG tablet Take 150 mg by mouth at bedtime.   05/13/2015 at Unknown time  . rOPINIRole (REQUIP) 4 MG tablet Take 4 mg by mouth at bedtime. For parkinson disease   05/13/2015 at Unknown time  . sennosides-docusate sodium (SENOKOT-S) 8.6-50 MG tablet Take 1 tablet by mouth 2 (two) times  daily. For constipation   05/13/2015 at Unknown time  . hydroxypropyl methylcellulose / hypromellose (ISOPTO TEARS / GONIOVISC) 2.5 % ophthalmic solution Place 1 drop into the right eye as needed for dry eyes.   Unknown   Scheduled: . albuterol  2.5 mg Nebulization TID  . aztreonam  1 g Intravenous Q8H  . calcium-vitamin D  1 tablet Oral TID  . escitalopram  10 mg Oral Daily  . fentaNYL  50 mcg Transdermal QODAY  . levETIRAcetam  250 mg Oral Q12H  . levothyroxine  175 mcg Oral QAC breakfast  . loratadine  10 mg Oral Daily  . methylPREDNISolone (SOLU-MEDROL) injection  40 mg Intravenous Q6H  . metoprolol tartrate  25 mg Oral Daily  . multivitamin with minerals  1 tablet Oral Daily  . rOPINIRole  4 mg Oral QHS  . sennosides  5 mL Oral BID  . vancomycin  500 mg Intravenous Q24H   Continuous:  PBA:[id number], ipratropium-albuterol, phenylephrine-shark liver oil-mineral oil-petrolatum  Assesment: She is admitted with healthcare associated pneumonia from recurrent aspiration. She is going to need IV antibiotics for a more prolonged.. She needs a PICC line. Once the PICC line is placed she should be able to return to her skilled care facility Principal Problem:   Healthcare-associated pneumonia Active Problems:   HCAP (healthcare-associated pneumonia)   Hypothyroidism   Osteoporosis   COPD exacerbation ([place])   Acute on chronic respiratory  failure with hypoxia ([place])   Seizure disorder ([place])   Pneumonia   Palliative care encounter   DNR (do not resuscitate) discussion   Aspiration pneumonia of left lung ([place])    Plan: Place PICC line and return to skilled care facility after this is accomplished    LOS: 4 days   [name] 05/05/2015, 9:40 AM

## 2015-05-18 NOTE — Clinical Social Work Note (Signed)
Anticipate [facility] afternoon when pt returns from PICC line placement. Pt's daughter, Tobe SosGay Citty and facility aware and agreeable. Will transfer with staff.   Derenda FennelKara Nieves Barberi, LCSW [phone]

## 2015-05-18 NOTE — Clinical Documentation Improvement (Deleted)
Hospitalist  Would you please further clarify medical condition associated with clinical findings?   Document Severity - Severe(third degree), Moderate (second degree), Mild (first degree)  Other condition  Unable to clinically determine  Document any associated diagnoses/conditions   Supporting Information: :  Noted in past medical history of progress note on 05/17/15 that pt ws diagnosed with protein-calorie severe malnutrition.  BMI 19. She has had 6 admissions for aspiration.  She also coughed on thickened apple juice.    Please exercise your independent, professional judgment when responding. A specific answer is not anticipated or expected.   Thank Modesta MessingYou, Preet Mangano L [facility] Information Management [place] [phone]

## 2015-05-18 NOTE — Clinical Documentation Improvement (Signed)
Hospitalist  Would you please clarify related medical condition to clinical findings noted in query?   Document Severity - Severe(third degree), Moderate (second degree), Mild (first degree)  Other condition  Unable to clinically determine  Document any associated diagnoses/conditions   Supporting Information: :  Noted in past medical history from 09/13/14, she had diagnosis of severe protein-calorie malnutrition.  BMI 19. And she has had at least 6 admissions for aspiration.  She also coughs when drinking thickened apple juice.   Please exercise your independent, professional judgment when responding. A specific answer is not anticipated or expected.   Thank Modesta MessingYou, Artelia Game L [facility]  [phone]

## 2015-05-18 NOTE — Clinical Social Work Note (Signed)
Facility is aware of PICC line placement and possible d/c later today.  Derenda FennelKara Libbie Bartley, LCSW [phone]

## 2015-05-18 NOTE — Procedures (Signed)
Placement of left arm PICC.  Tip at SVC/RA junction.  Ready to use.

## 2015-05-18 NOTE — Discharge Summary (Signed)
Physician Discharge Summary  Patient ID: [name] MRN: [id number] DOB/AGE: Dec 16, 1922 81 y.o. Primary Care Physician:Marwin Primmer L, MD Admit date: 05/14/2015 Discharge date: May 30, 2015    Discharge Diagnoses:   Principal Problem:   Healthcare-associated pneumonia Active Problems:   HCAP (healthcare-associated pneumonia)   Hypothyroidism   Osteoporosis   COPD exacerbation ([facility])   Acute on chronic respiratory failure with hypoxia ([facility])   Protein-calorie malnutrition, severe ([facility])   Seizure disorder ([facility])   Pneumonia   Palliative care encounter   DNR (do not resuscitate) discussion   Aspiration pneumonia of left lung ([facility])     Medication List    STOP taking these medications        AZO CRANBERRY PO     clindamycin 300 MG capsule  Commonly known as:  CLEOCIN     loratadine 10 MG tablet  Commonly known as:  CLARITIN     multivitamin with minerals Tabs tablet     MYLANTA 200-200-20 MG/5ML suspension  Generic drug:  alum & mag hydroxide-simeth     PREPARATION H 0.25-3-14-71.9 % rectal ointment  Generic drug:  phenylephrine-shark liver oil-mineral oil-petrolatum      TAKE these medications        aztreonam 1 g in dextrose 5 % 50 mL  Inject 1 g into the vein every 8 (eight) hours.     calcium-vitamin D 500-200 MG-UNIT tablet  Commonly known as:  OSCAL WITH D  Take 1 tablet by mouth 3 (three) times daily. For osteoporosis     docusate sodium 100 MG capsule  Commonly known as:  COLACE  Take 100 mg by mouth 2 (two) times daily. For constipation     escitalopram 10 MG tablet  Commonly known as:  LEXAPRO  Take 10 mg by mouth daily. For depression     feeding supplement (PRO-STAT SUGAR FREE 64) Liqd  Take 30 mLs by mouth 2 (two) times daily.     fentaNYL 50 MCG/HR  Commonly known as:  DURAGESIC - dosed mcg/hr  Place 50 mcg onto the skin every other day.     furosemide 20 MG tablet  Commonly known as:  LASIX  Take 20 mg by mouth daily. For HTN/  edema     HYDROcodone-acetaminophen 5-325 MG tablet  Commonly known as:  NORCO/VICODIN  Take 0.5 tablets by mouth 2 (two) times daily.     hydroxypropyl methylcellulose / hypromellose 2.5 % ophthalmic solution  Commonly known as:  ISOPTO TEARS / GONIOVISC  Place 1 drop into the right eye as needed for dry eyes.     ipratropium-albuterol 0.5-2.5 (3) MG/3ML Soln  Commonly known as:  DUONEB  Take 3 mLs by nebulization every 6 (six) hours as needed (Shortness Of Breath).     iron polysaccharides 150 MG capsule  Commonly known as:  NIFEREX  Take 150 mg by mouth daily.     levETIRAcetam 250 MG tablet  Commonly known as:  KEPPRA  Take 250 mg by mouth every 12 (twelve) hours. For seizures     levothyroxine 175 MCG tablet  Commonly known as:  SYNTHROID, LEVOTHROID  Take 175 mcg by mouth daily. For thyroid therapy     magnesium hydroxide 400 MG/5ML suspension  Commonly known as:  MILK OF MAGNESIA  Take 30 mLs by mouth daily as needed for mild constipation.     metoprolol tartrate 25 MG tablet  Commonly known as:  LOPRESSOR  Take 25 mg by mouth daily. For HTN     predniSONE  10 MG tablet  Commonly known as:  DELTASONE  Take 1 tablet (10 mg total) by mouth daily with breakfast. Take 40 mg daily 3 days 303 days 203 days 103 days     ranitidine 150 MG tablet  Commonly known as:  ZANTAC  Take 150 mg by mouth at bedtime.     rOPINIRole 4 MG tablet  Commonly known as:  REQUIP  Take 4 mg by mouth at bedtime. For parkinson disease     sennosides-docusate sodium 8.6-50 MG tablet  Commonly known as:  SENOKOT-S  Take 1 tablet by mouth 2 (two) times daily. For constipation     vancomycin 500 mg in sodium chloride 0.9 % 100 mL  Inject 500 mg into the vein daily.        Discharged Condition:improved    Consults:IR for PICC  Significant Diagnostic Studies: Dg Chest 2 View  05/07/2015  CLINICAL DATA:  Shortness of Breath, cough and congestion EXAM: CHEST - 2 VIEW COMPARISON:   04/18/2015 FINDINGS: Large hiatal hernia is again seen. The cardiac shadow is stable. Multilevel vertebral augmentation is seen. Increasing infiltrate is noted in the bases bilaterally slightly worse on the right than the left. No other focal abnormality is seen. IMPRESSION: Increasing bibasilar infiltrates. Electronically Signed   By: Alcide Clever M.D.   On: 05/07/2015 15:21   Dg Chest Port 1 View  05/14/2015  CLINICAL DATA:  Gradually worsening respiratory distress for 24 hours EXAM: PORTABLE CHEST 1 VIEW COMPARISON:  May 07, 2015 FINDINGS: The heart size is enlarged. The mediastinal contour is stable. There is consolidation of the left mid and lung base. There is left pleural effusion. The right lung is clear. The bony structures are stable. IMPRESSION: Pneumonia of the left mid and lung base with left pleural effusion. Electronically Signed   By: Sherian Rein M.D.   On: 05/14/2015 09:55   Dg Chest Port 1 View  05/07/2015  CLINICAL DATA:  I initial evaluation for acute shortness of breath. EXAM: PORTABLE CHEST 1 VIEW COMPARISON:  Prior study from 05/07/2015. FINDINGS: Patient is markedly rotated to the left. Moderate cardiomegaly is similar to previous. Mediastinal silhouette within normal limits. Prominent atheromatous plaque within the aortic arch. Large hiatal hernia again noted. Lungs are mildly hypoinflated. There are bibasilar are irregular parenchymal opacities, which may reflect atelectasis, infiltrates, or possibly aspiration. Small right pleural effusion is suspected. Vascular congestion without frank pulmonary edema. No pneumothorax. Osseous structures unchanged. Sequelae of prior vertebral augmentation noted within the thoracolumbar spine. IMPRESSION: 1. Patchy bibasilar airspace opacities, which may reflect atelectasis or sequela of infection or aspiration pneumonitis. 2. Stable cardiomegaly with diffuse pulmonary vascular congestion without overt pulmonary edema. 3. Suspect small right  pleural effusion. 4. Large hiatal hernia. Electronically Signed   By: Rise Mu M.D.   On: 05/07/2015 22:16    Lab Results: Basic Metabolic Panel:  Recent Labs  40/98/11 0612 05/17/15 0557  NA 142 141  K 3.9 4.2  CL 102 100*  CO2 34* 37*  GLUCOSE 112* 112*  BUN 42* 38*  CREATININE 1.11* 0.95  CALCIUM 9.9 9.7   Liver Function Tests: No results for input(s): AST, ALT, ALKPHOS, BILITOT, PROT, ALBUMIN in the last 72 hours.   CBC:  Recent Labs  05/16/15 0612 05/17/15 0557  WBC 17.9* 17.9*  HGB 8.3* 7.9*  HCT 25.9* 26.2*  MCV 94.9 93.6  PLT 308 342    Recent Results (from the past 240 hour(s))  MRSA PCR Screening  Status: None   Collection Time: 05/09/15 11:05 AM  Result Value Ref Range Status   MRSA by PCR NEGATIVE NEGATIVE Final    Comment:        The GeneXpert MRSA Assay (FDA approved for NASAL specimens only), is one component of a comprehensive MRSA colonization surveillance program. It is not intended to diagnose MRSA infection nor to guide or monitor treatment for MRSA infections.   MRSA PCR Screening     Status: None   Collection Time: 05/15/15  6:00 PM  Result Value Ref Range Status   MRSA by PCR NEGATIVE NEGATIVE Final    Comment:        The GeneXpert MRSA Assay (FDA approved for NASAL specimens only), is one component of a comprehensive MRSA colonization surveillance program. It is not intended to diagnose MRSA infection nor to guide or monitor treatment for MRSA infections.      Hospital Course: she was admitted with another episode of aspiration/ HCAP. She was started on IV antibiotics and improved. She had palliative care consult for goals of care. She wants to continue treatment for now. She had PICC line placed for IV antibiotics.  Discharge Exam: Blood pressure 153/80, pulse 90, temperature 98.1 F (36.7 C), temperature source Oral, resp. rate 16, height 4\' 11"  (1.499 m), weight 43.999 kg (97 lb), SpO2 91 %. She is  awake and alert. Chest is clearer.  Disposition: back to snf. Aztreonam and vancomycin for 10 more days. Continue soft diet with nectar liquids.       Discharge Instructions    Discharge to SNF when bed available    Complete by:  As directed              Signed: Kalix Meinecke L   05/02/2015, 1:17 PM

## 2015-05-18 NOTE — Progress Notes (Signed)
ANTIBIOTIC CONSULT NOTE -Follow up  Pharmacy Consult for Vancomycin & Azactam Indication: pneumonia  Allergies  Allergen Reactions  . Aricept [Donepezil Hcl]   . Bactrim [Sulfamethoxazole-Trimethoprim]   . Ciprofloxacin   . Darvocet [Propoxyphene N-Acetaminophen]   . Namenda [Memantine Hcl]   . Penicillins   . Vibramycin [Doxycycline Calcium]    Patient Measurements: Height:  (149.9 cm) Weight: 97 lb (43.999 kg) IBW/kg (Calculated) : 43.2  Vital Signs: Temp: 98.1 F (36.7 C) (12/29 0500) Temp Source: Oral (12/29 0500) BP: 153/80 mmHg (12/29 0500) Pulse Rate: 90 (12/29 0845) Intake/Output from previous day: 12/28 0701 - 12/29 0700 In: 205 [P.O.:205] Out: 600 [Urine:600] Intake/Output from this shift: Total I/O In: 120 [P.O.:120] Out: 100 [Urine:100]  Labs:  Recent Labs  05/16/15 0612 05/17/15 0557  WBC 17.9* 17.9*  HGB 8.3* 7.9*  PLT 308 342  CREATININE 1.11* 0.95   Estimated Creatinine Clearance: 26.8 mL/min (by C-G formula based on Cr of 0.95). No results for input(s): VANCOTROUGH, VANCOPEAK, VANCORANDOM, GENTTROUGH, GENTPEAK, GENTRANDOM, TOBRATROUGH, TOBRAPEAK, TOBRARND, AMIKACINPEAK, AMIKACINTROU, AMIKACIN in the last 72 hours.   Microbiology: Recent Results (from the past 720 hour(s))  Urine culture     Status: None   Collection Time: 05/07/15  9:29 PM  Result Value Ref Range Status   Specimen Description URINE, CATHETERIZED  Final   Special Requests NONE  Final   Culture   Final    NO GROWTH 1 DAY Performed at [facility]    Report Status 05/09/2015 FINAL  Final  Culture, blood (routine x 2)     Status: None   Collection Time: 05/07/15 11:36 PM  Result Value Ref Range Status   Specimen Description BLOOD LEFT ANTECUBITAL  Final   Special Requests BOTTLES DRAWN AEROBIC AND ANAEROBIC 6CC EACH  Final   Culture NO GROWTH 5 DAYS  Final   Report Status 05/13/2015 FINAL  Final  Culture, blood (routine x 2)     Status: None   Collection  Time: 05/07/15 11:46 PM  Result Value Ref Range Status   Specimen Description BLOOD LEFT ANTECUBITAL  Final   Special Requests BOTTLES DRAWN AEROBIC AND ANAEROBIC 8CC EACH  Final   Culture NO GROWTH 5 DAYS  Final   Report Status 05/13/2015 FINAL  Final  MRSA PCR Screening     Status: None   Collection Time: 05/09/15 11:05 AM  Result Value Ref Range Status   MRSA by PCR NEGATIVE NEGATIVE Final    Comment:        The GeneXpert MRSA Assay (FDA approved for NASAL specimens only), is one component of a comprehensive MRSA colonization surveillance program. It is not intended to diagnose MRSA infection nor to guide or monitor treatment for MRSA infections.   MRSA PCR Screening     Status: None   Collection Time: 05/15/15  6:00 PM  Result Value Ref Range Status   MRSA by PCR NEGATIVE NEGATIVE Final    Comment:        The GeneXpert MRSA Assay (FDA approved for NASAL specimens only), is one component of a comprehensive MRSA colonization surveillance program. It is not intended to diagnose MRSA infection nor to guide or monitor treatment for MRSA infections.    Medical History: Past Medical History  Diagnosis Date  . Seizures (HCC)   . Aspiration pneumonia (HCC)   . Dysphagia   . Osteoporosis   . Hypothyroidism   . Depression   . MRSA (methicillin resistant staph aureus) culture positive   .  Respiratory failure (HCC)   . Vertebral compression fracture (HCC)     Thoracic and lumbar  . History of sinus cancer 1970's    Left side  . Chronic pain   . Essential hypertension   . GERD (gastroesophageal reflux disease)   . DNR (do not resuscitate)    Medications:  Scheduled:  . albuterol  2.5 mg Nebulization TID  . aztreonam  1 g Intravenous Q8H  . calcium-vitamin D  1 tablet Oral TID  . docusate sodium  100 mg Oral Daily  . escitalopram  10 mg Oral Daily  . fentaNYL  50 mcg Transdermal QODAY  . levETIRAcetam  250 mg Oral Q12H  . levothyroxine  175 mcg Oral QAC  breakfast  . loratadine  10 mg Oral Daily  . methylPREDNISolone (SOLU-MEDROL) injection  40 mg Intravenous Q6H  . metoprolol tartrate  25 mg Oral Daily  . multivitamin with minerals  1 tablet Oral Daily  . rOPINIRole  4 mg Oral QHS  . sennosides  5 mL Oral BID  . vancomycin  500 mg Intravenous Q24H   Assessment: 81 yo female presented to ED, from [facility], increasing respiratory distress over past 1-2 days.  Pt started on Vancomycin and Aztreonam.  Estimated Creatinine Clearance: 26.8 mL/min (by C-G formula based on Cr of 0.95).  MRSA pcr (-). WBC elevated but pt on steroids.   Goal of Therapy:  Vancomycin trough level 15-20 mcg/ml  Plan:  Vancomycin 500 mg IV every 24 hours Check trough level tomorrow Continue Azactam 1 GM IV every 8 hours Deescalate ABX when appropriate / improved Monitor renal function, progress, cultures  Valrie HartHall, Saralynn Langhorst A 04/21/2015,12:34 PM

## 2015-05-18 NOTE — Care Management Important Message (Signed)
Important Message  Patient Details  Name: [name] MRN: [id number] Date of Birth: Jun 26, 1922   Medicare Important Message Given:  Yes    Cheryl FlashBlackwell, Roe Koffman Crowder, RN 05/06/2015, 1:26 PM

## 2015-05-18 NOTE — Care Management Note (Signed)
Case Management Note  Patient Details  Name: [name] Vanhise MRN: [id number] Date of Birth: [birth year]  Subjective/Objective:                    Action/Plan:   Expected Discharge Date:                  Expected Discharge Plan:  Skilled Nursing Facility  In-House Referral:  Clinical Social Work  Discharge planning Services  CM Consult  Post Acute Care Choice:  NA Choice offered to:  NA  DME Arranged:    DME Agency:     HH Arranged:    HH Agency:     Status of Service:  Completed, signed off  Medicare Important Message Given:  Yes Date Medicare IM Given:    Medicare IM give by:    Date Additional Medicare IM Given:    Additional Medicare Important Message give by:     If discussed at Long Length of Stay Meetings, dates discussed:    Additional Comments: Anticip[facility]. Staff to take pt once discharge complete. Arlyss QueenBlackwell, Gaje Tennyson Canarowder, RN 03-27-15, 3:31 PM

## 2015-05-19 LAB — CULTURE, BLOOD (ROUTINE X 2)
CULTURE: NO GROWTH
Culture: NO GROWTH

## 2015-05-21 ENCOUNTER — Other Ambulatory Visit (HOSPITAL_COMMUNITY)
Admission: RE | Admit: 2015-05-21 | Discharge: 2015-05-21 | Disposition: A | Discharge status: Home / Self Care | Payer: Medicare Other | Source: Ambulatory Visit | Attending: Pulmonary Disease | Admitting: Pulmonary Disease

## 2015-05-21 ENCOUNTER — Other Ambulatory Visit (HOSPITAL_COMMUNITY)
Admission: AD | Admit: 2015-05-21 | Discharge: 2015-05-21 | Disposition: A | Discharge status: Home / Self Care | Payer: Medicare Other | Source: Skilled Nursing Facility | Attending: Pulmonary Disease | Admitting: Pulmonary Disease

## 2015-05-21 DIAGNOSIS — J9611 Chronic respiratory failure with hypoxia: Secondary | ICD-10-CM | POA: Insufficient documentation

## 2015-05-21 LAB — CBC WITH DIFFERENTIAL/PLATELET
BASOS ABS: 0 10*3/uL (ref 0.0–0.1)
BASOS PCT: 0 %
Eosinophils Absolute: 0 10*3/uL (ref 0.0–0.7)
Eosinophils Relative: 0 %
HCT: 27.1 % — ABNORMAL LOW (ref 36.0–46.0)
Hemoglobin: 8 g/dL — ABNORMAL LOW (ref 12.0–15.0)
LYMPHS PCT: 2 %
Lymphs Abs: 0.3 10*3/uL — ABNORMAL LOW (ref 0.7–4.0)
MCH: 28.4 pg (ref 26.0–34.0)
MCHC: 29.5 g/dL — AB (ref 30.0–36.0)
MCV: 96.1 fL (ref 78.0–100.0)
MONO ABS: 0 10*3/uL — AB (ref 0.1–1.0)
Monocytes Relative: 0 %
NEUTROS ABS: 16.3 10*3/uL — AB (ref 1.7–7.7)
NEUTROS PCT: 98 %
PLATELETS: 305 10*3/uL (ref 150–400)
RBC: 2.82 MIL/uL — AB (ref 3.87–5.11)
RDW: 16.1 % — ABNORMAL HIGH (ref 11.5–15.5)
WBC: 16.6 10*3/uL — ABNORMAL HIGH (ref 4.0–10.5)

## 2015-05-21 LAB — BASIC METABOLIC PANEL
ANION GAP: 9 (ref 5–15)
BUN: 58 mg/dL — ABNORMAL HIGH (ref 6–20)
CALCIUM: 8.9 mg/dL (ref 8.9–10.3)
CO2: 34 mmol/L — ABNORMAL HIGH (ref 22–32)
Chloride: 102 mmol/L (ref 101–111)
Creatinine, Ser: 1.43 mg/dL — ABNORMAL HIGH (ref 0.44–1.00)
GFR, EST AFRICAN AMERICAN: 36 mL/min — AB (ref >60)
GFR, EST NON AFRICAN AMERICAN: 31 mL/min — AB (ref >60)
GLUCOSE: 195 mg/dL — AB (ref 65–99)
POTASSIUM: 4.4 mmol/L (ref 3.5–5.1)
Sodium: 145 mmol/L (ref 135–145)

## 2015-05-21 LAB — VANCOMYCIN, TROUGH: VANCOMYCIN TR: 14 ug/mL (ref 10.0–20.0)

## 2015-05-21 DEATH — deceased

## 2015-05-24 ENCOUNTER — Other Ambulatory Visit (HOSPITAL_COMMUNITY)
Admission: AD | Admit: 2015-05-24 | Discharge: 2015-05-24 | Disposition: A | Discharge status: Home / Self Care | Payer: No Typology Code available for payment source | Source: Skilled Nursing Facility | Attending: Internal Medicine | Admitting: Internal Medicine

## 2015-05-24 DIAGNOSIS — Z5181 Encounter for therapeutic drug level monitoring: Secondary | ICD-10-CM | POA: Insufficient documentation

## 2015-05-24 LAB — BASIC METABOLIC PANEL
ANION GAP: 5 (ref 5–15)
BUN: 52 mg/dL — AB (ref 6–20)
CHLORIDE: 101 mmol/L (ref 101–111)
CO2: 37 mmol/L — ABNORMAL HIGH (ref 22–32)
CREATININE: 0.98 mg/dL (ref 0.44–1.00)
Calcium: 9 mg/dL (ref 8.9–10.3)
GFR calc non Af Amer: 49 mL/min — ABNORMAL LOW (ref >60)
GFR, EST AFRICAN AMERICAN: 57 mL/min — AB (ref >60)
Glucose, Bld: 177 mg/dL — ABNORMAL HIGH (ref 65–99)
Potassium: 4.6 mmol/L (ref 3.5–5.1)
SODIUM: 143 mmol/L (ref 135–145)

## 2015-05-24 LAB — VANCOMYCIN, TROUGH: Vancomycin Tr: 16 ug/mL (ref 10.0–20.0)

## 2015-05-26 ENCOUNTER — Other Ambulatory Visit (HOSPITAL_COMMUNITY)
Admission: RE | Admit: 2015-05-26 | Discharge: 2015-05-26 | Disposition: A | Discharge status: Home / Self Care | Payer: No Typology Code available for payment source | Source: Skilled Nursing Facility | Attending: Internal Medicine | Admitting: Internal Medicine

## 2015-05-26 DIAGNOSIS — Z792 Long term (current) use of antibiotics: Secondary | ICD-10-CM | POA: Insufficient documentation

## 2015-05-26 LAB — BASIC METABOLIC PANEL
Anion gap: 6 (ref 5–15)
BUN: 44 mg/dL — AB (ref 6–20)
CO2: 37 mmol/L — ABNORMAL HIGH (ref 22–32)
Calcium: 9.2 mg/dL (ref 8.9–10.3)
Chloride: 104 mmol/L (ref 101–111)
Creatinine, Ser: 0.83 mg/dL (ref 0.44–1.00)
GLUCOSE: 114 mg/dL — AB (ref 65–99)
Potassium: 4.6 mmol/L (ref 3.5–5.1)
SODIUM: 147 mmol/L — AB (ref 135–145)

## 2015-05-26 LAB — VANCOMYCIN, TROUGH: Vancomycin Tr: 18 ug/mL (ref 10.0–20.0)

## 2015-06-21 NOTE — H&P (Signed)
NAME:  [name], [name]               ACCOUNT NO.:  [id number]  MEDICAL RECORD NO.:  [id number]  LOCATION:  LAB                           FACILITY:  [facility]  PHYSICIAN:  Airanna Partin L. Juanetta GoslingHawkins, M.D.DATE OF BIRTH:  [birth year]  DATE OF ADMISSION:  05/26/2015 DATE OF DISCHARGE:  01/06/2017LH                             HISTORY & PHYSICAL   HISTORY:  [name] is a 82 year old, who has multiple medical problems, and has had multiple hospitalizations in the last several months.  Her last hospitalization was last month and she had recurrent aspiration pneumonia.  She had evaluation by palliative care at that time, and at that time, she was more alert and doing better, and it was felt that we would continue with treating her acute illnesses.  However, at this time, she has not "bounced back."  She is poorly responsive.  She is not eating.  She is having difficulty swallowing.  She is requiring oxygen. She complains of being tired and that she needs something to make her more comfortable.  She has some struggle to breathe.  She falls asleep during the exam.  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  PAST SURGICAL HISTORY:  Surgically, she has had kyphoplasty, palate surgery, enucleation, cholecystectomy, appendectomy, and a C-section.  MEDICATIONS:  Reviewed.  FAMILY HISTORY:  Positive for asthma.  SOCIAL HISTORY:  She has lived at the skilled care facility for several years.  She is a nonsmoker for her lifetime.  She does not use any alcohol.  REVIEW OF SYSTEMS:  Really not obtainable.  PHYSICAL EXAMINATION:  GENERAL:  Shows that she is very thin.  She is poorly responsive. HEENT:  Her pupil reacts.  Her nose and throat are clear. CHEST:  Shows marked bilateral rhonchi. HEART:  Regular. ABDOMEN:   Soft. EXTREMITIES:  No edema. CENTRAL NERVOUS SYSTEM:  Shows that she falls asleep frequently during the examination.  ASSESSMENT:  She had another bout of aspiration pneumonia.  Initially it was felt that it would be most appropriate to continue with fairly aggressive treatments, but since that time, she has deteriorated markedly.  I discussed the situation with her daughter and plan is for comfort care.  No return to the hospital.  Use of morphine as needed with the anticipation that her death will be imminent.     Euretha Najarro L. Juanetta GoslingHawkins, M.D.     ELH/MEDQ  D:  05/29/2015  T:  2015/12/11  Job:  [id number]

## 2015-06-21 DEATH — deceased

## 2016-02-17 IMAGING — DX DG CHEST 2V
2 series · 2 of 2 positions shown · non-contrast
Comparison: 04/04/2014

CLINICAL DATA: Increased shortness of breath and cough

EXAM:
CHEST  2 VIEW

[chest lat]
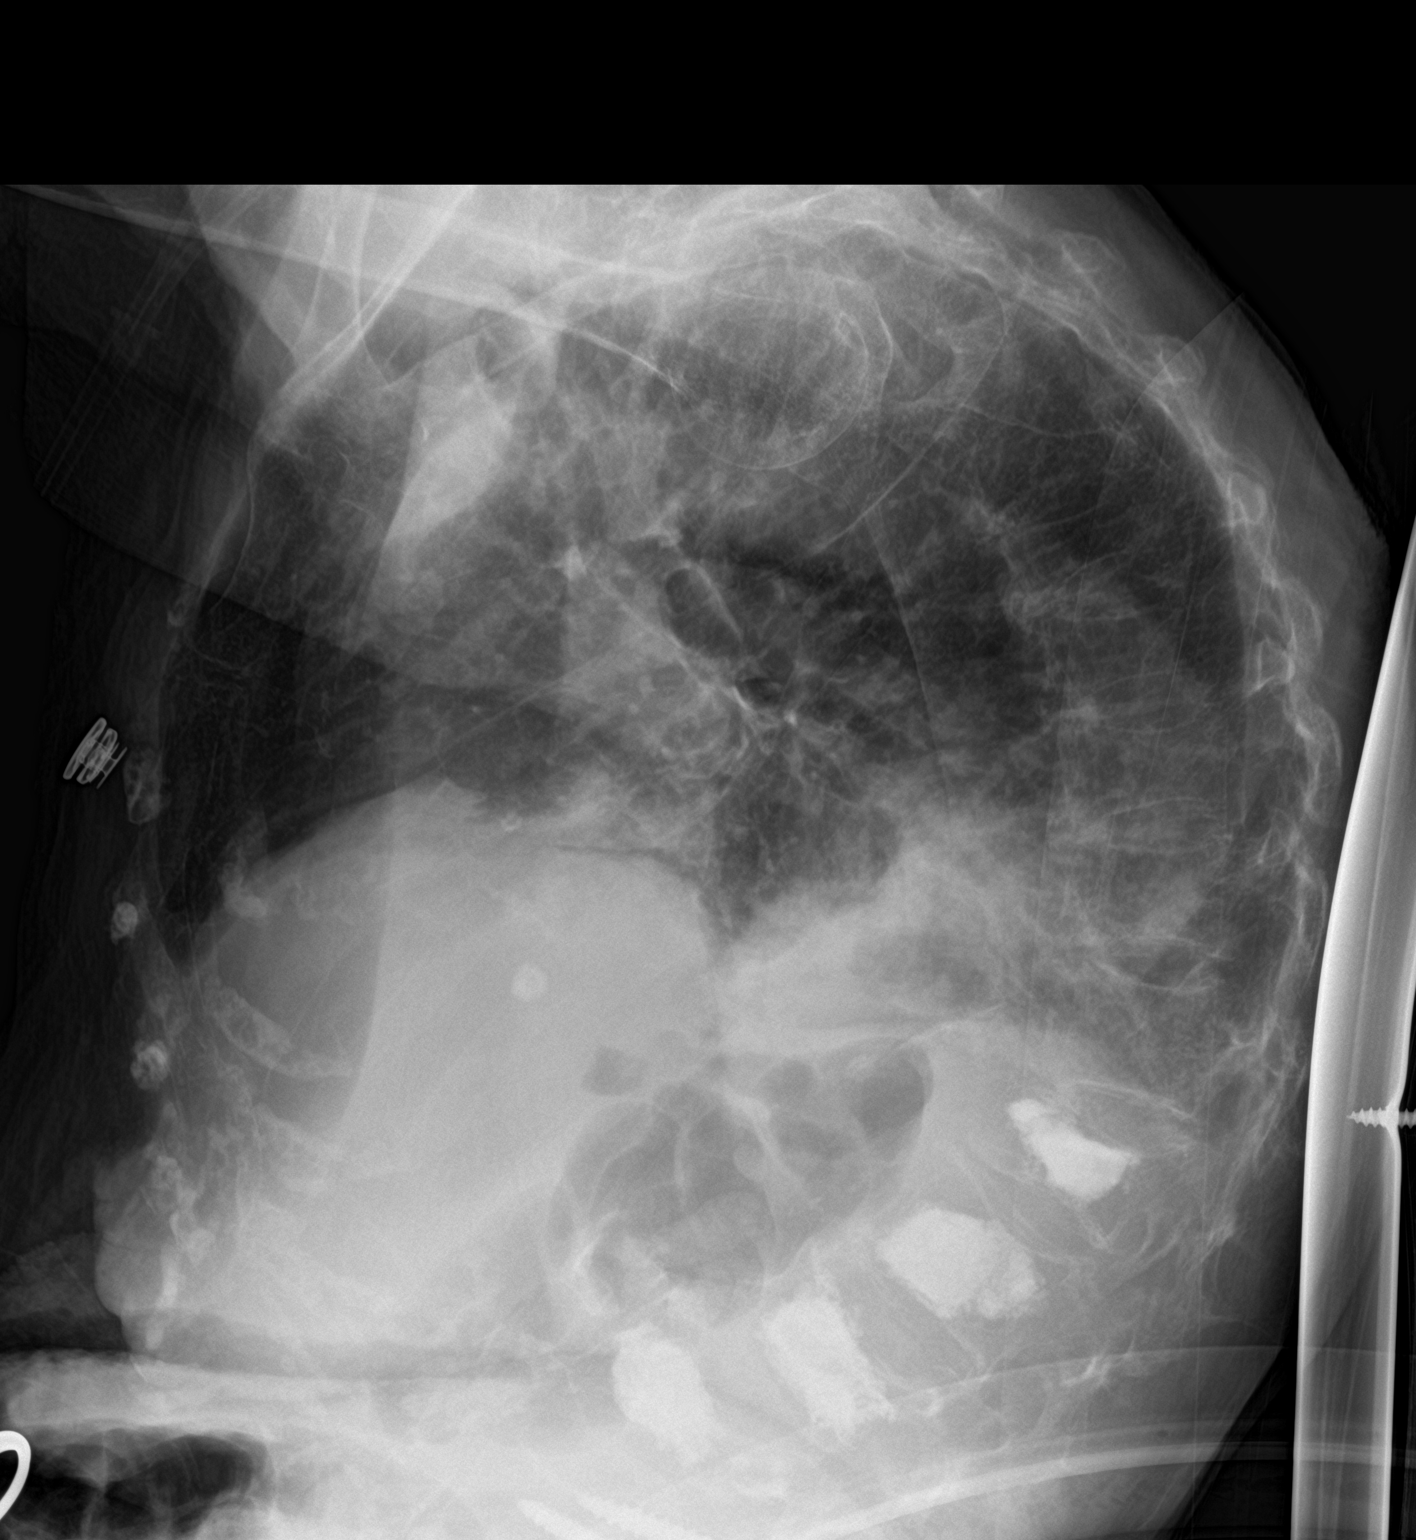

[chest ap strecther]
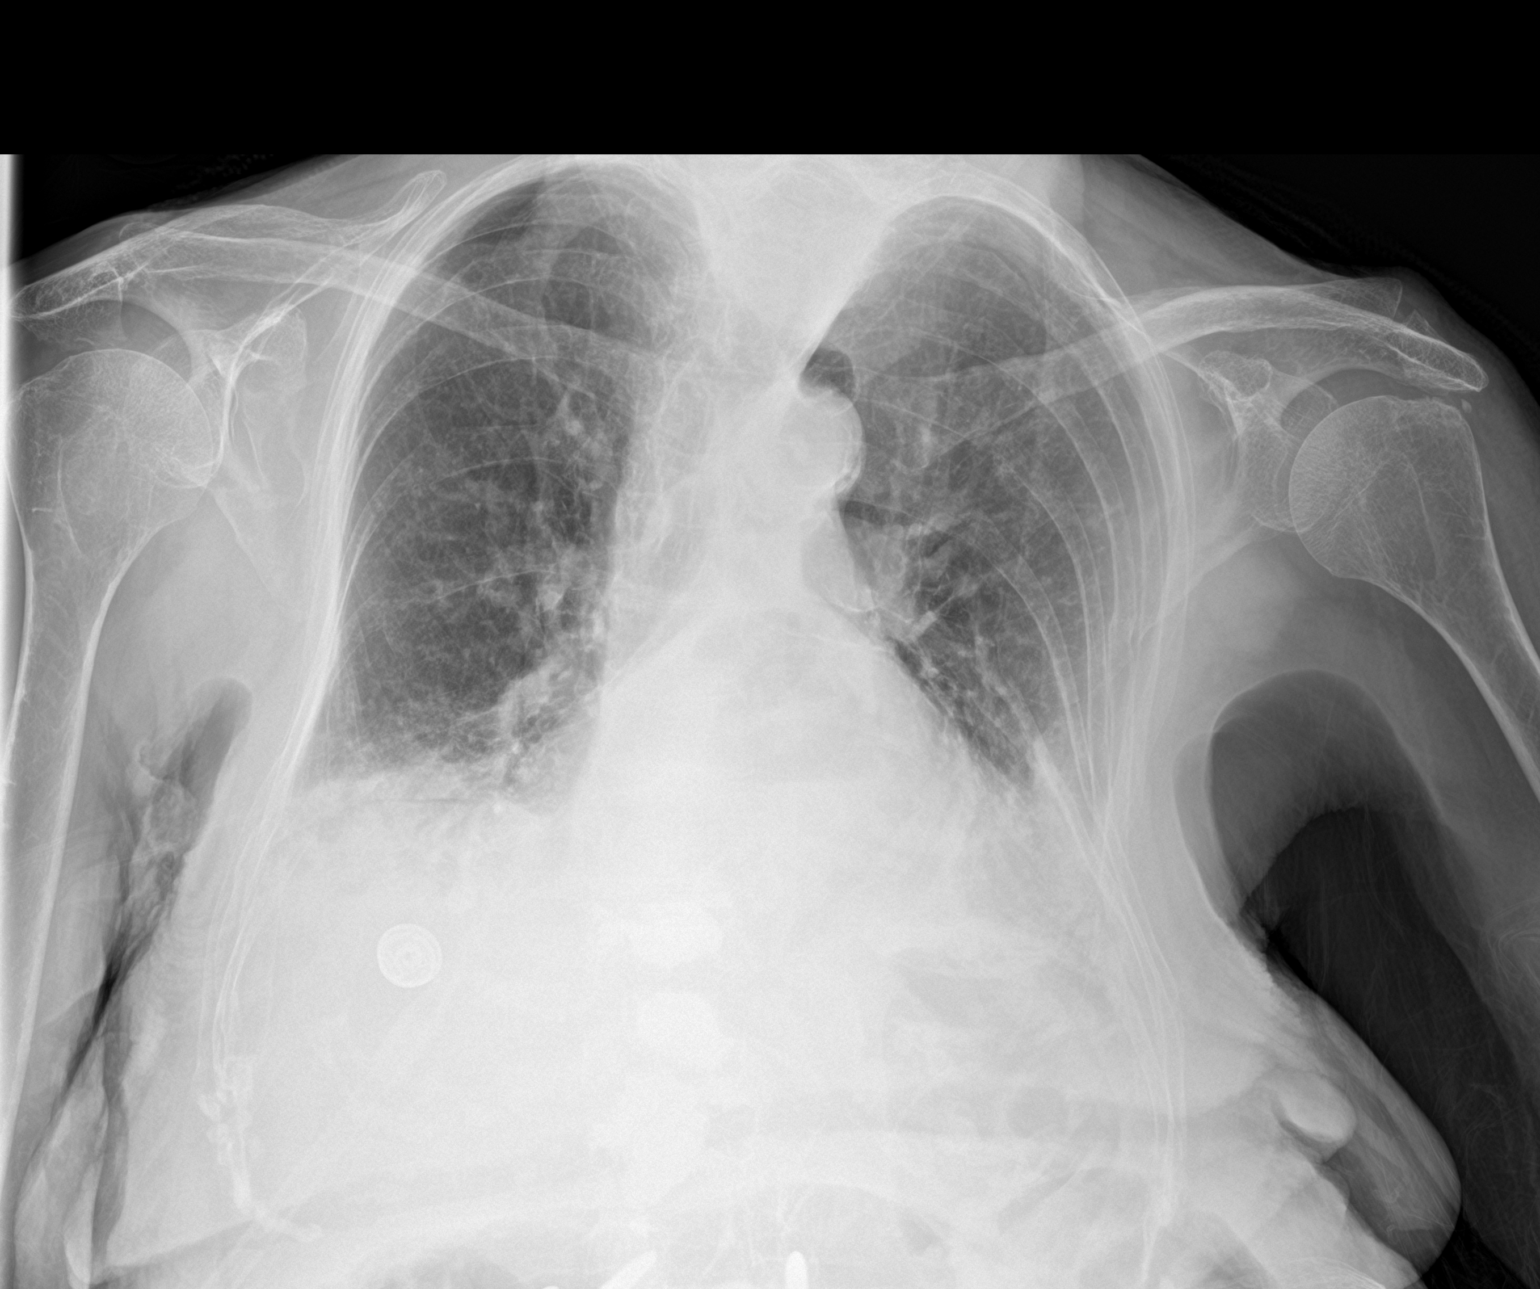

[2 of 2 positions shown; findings below may reference images not displayed]

FINDINGS: Cardiac shadow is stable. A large hiatal hernia is again identified.
Bibasilar atelectatic changes are seen slightly worse than that
noted on the prior exam. Changes of multiple vertebral augmentations
are again noted. No pneumothorax or sizable effusion is seen. No
acute bony abnormality is noted.
IMPRESSION: Bibasilar atelectatic changes worse on the right than the left.
These half increased in the interval from the prior exam.

## 2016-02-24 IMAGING — CR DG CHEST 1V SAME DAY
1 series · 1 of 1 positions shown · non-contrast
Comparison: 08/12/2014

CLINICAL DATA: Acute respiratory failure with hypercapnia

EXAM:
CHEST - 1 VIEW SAME DAY

[ap]
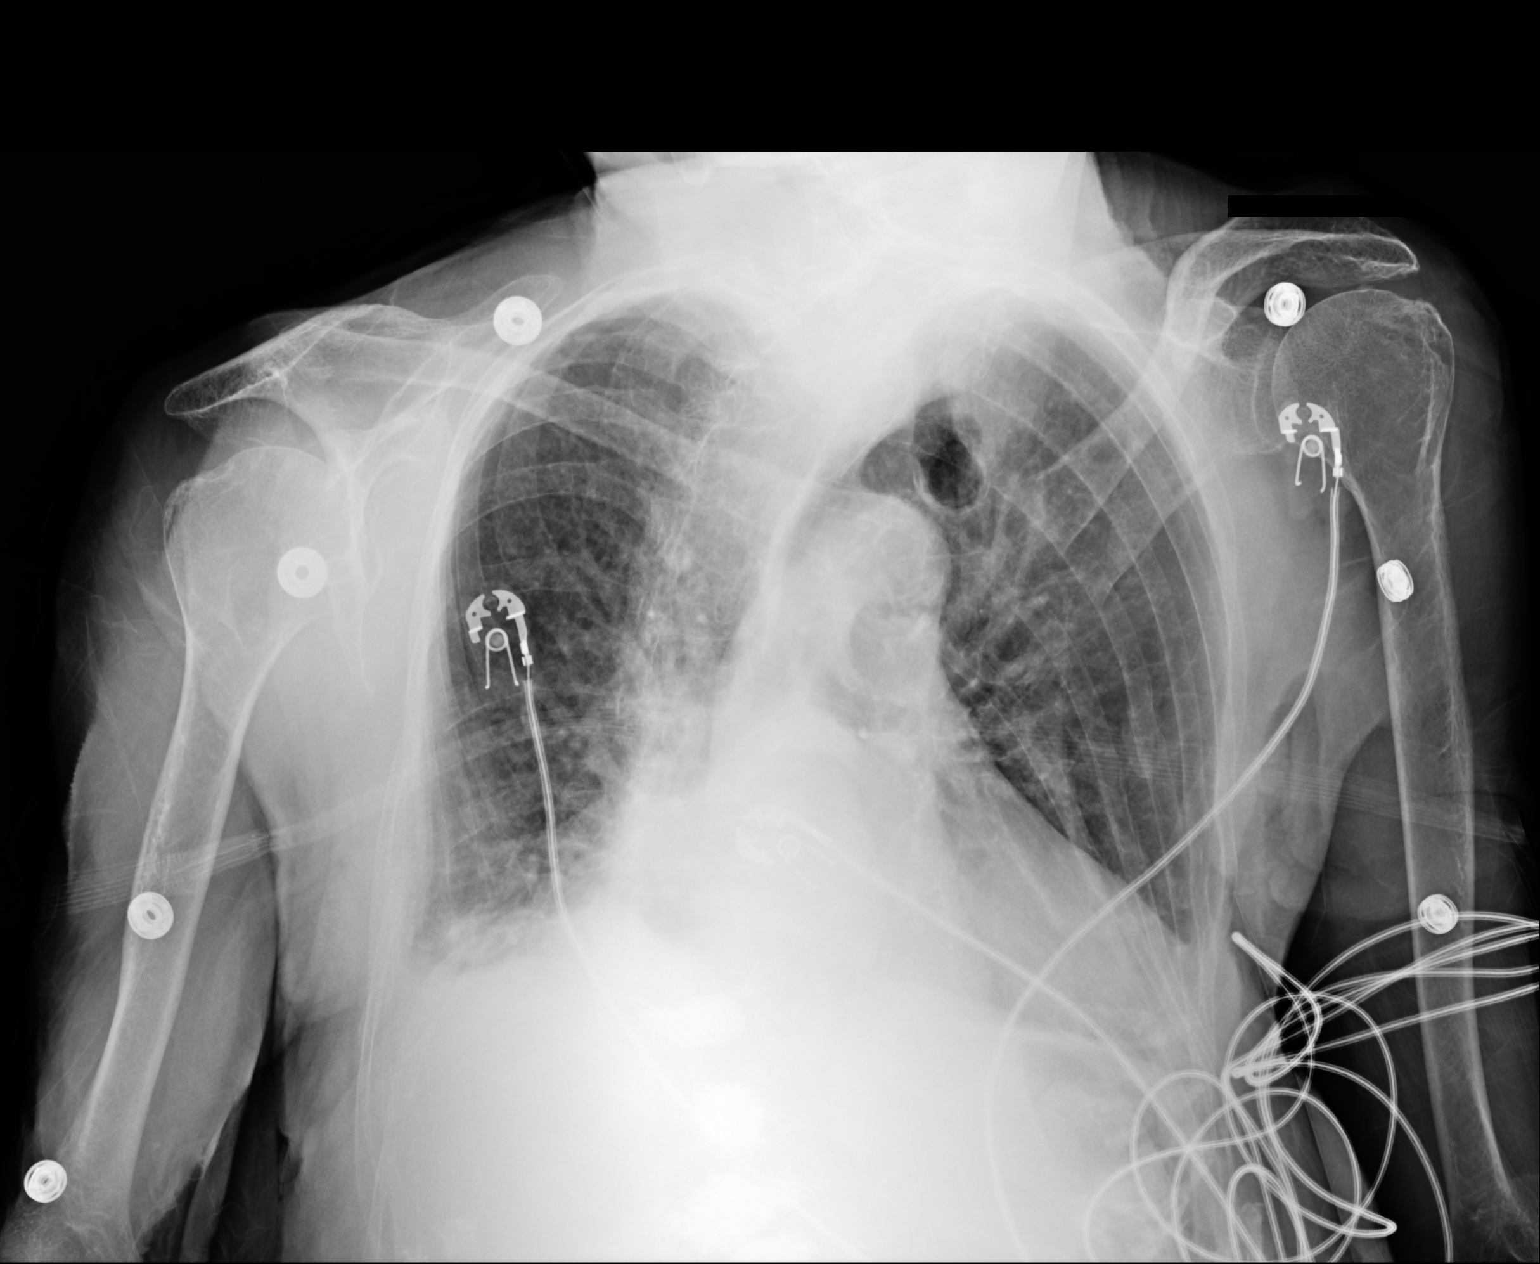

[1 of 1 positions shown; findings below may reference images not displayed]

FINDINGS: Streaky right base opacity, similar to previous.

No appreciable change in cardiomegaly or aortic tortuosity. Large
hiatal hernia is again noted. No edema, effusion, or pneumothorax.
IMPRESSION: Right basilar atelectasis or pneumonia, unchanged from earlier
today.

## 2016-09-21 IMAGING — DX DG CHEST 1V
1 series · 1 of 1 positions shown · non-contrast
Comparison: None.

CLINICAL DATA: Congestion.

EXAM:
CHEST 1 VIEW

[chest pa]
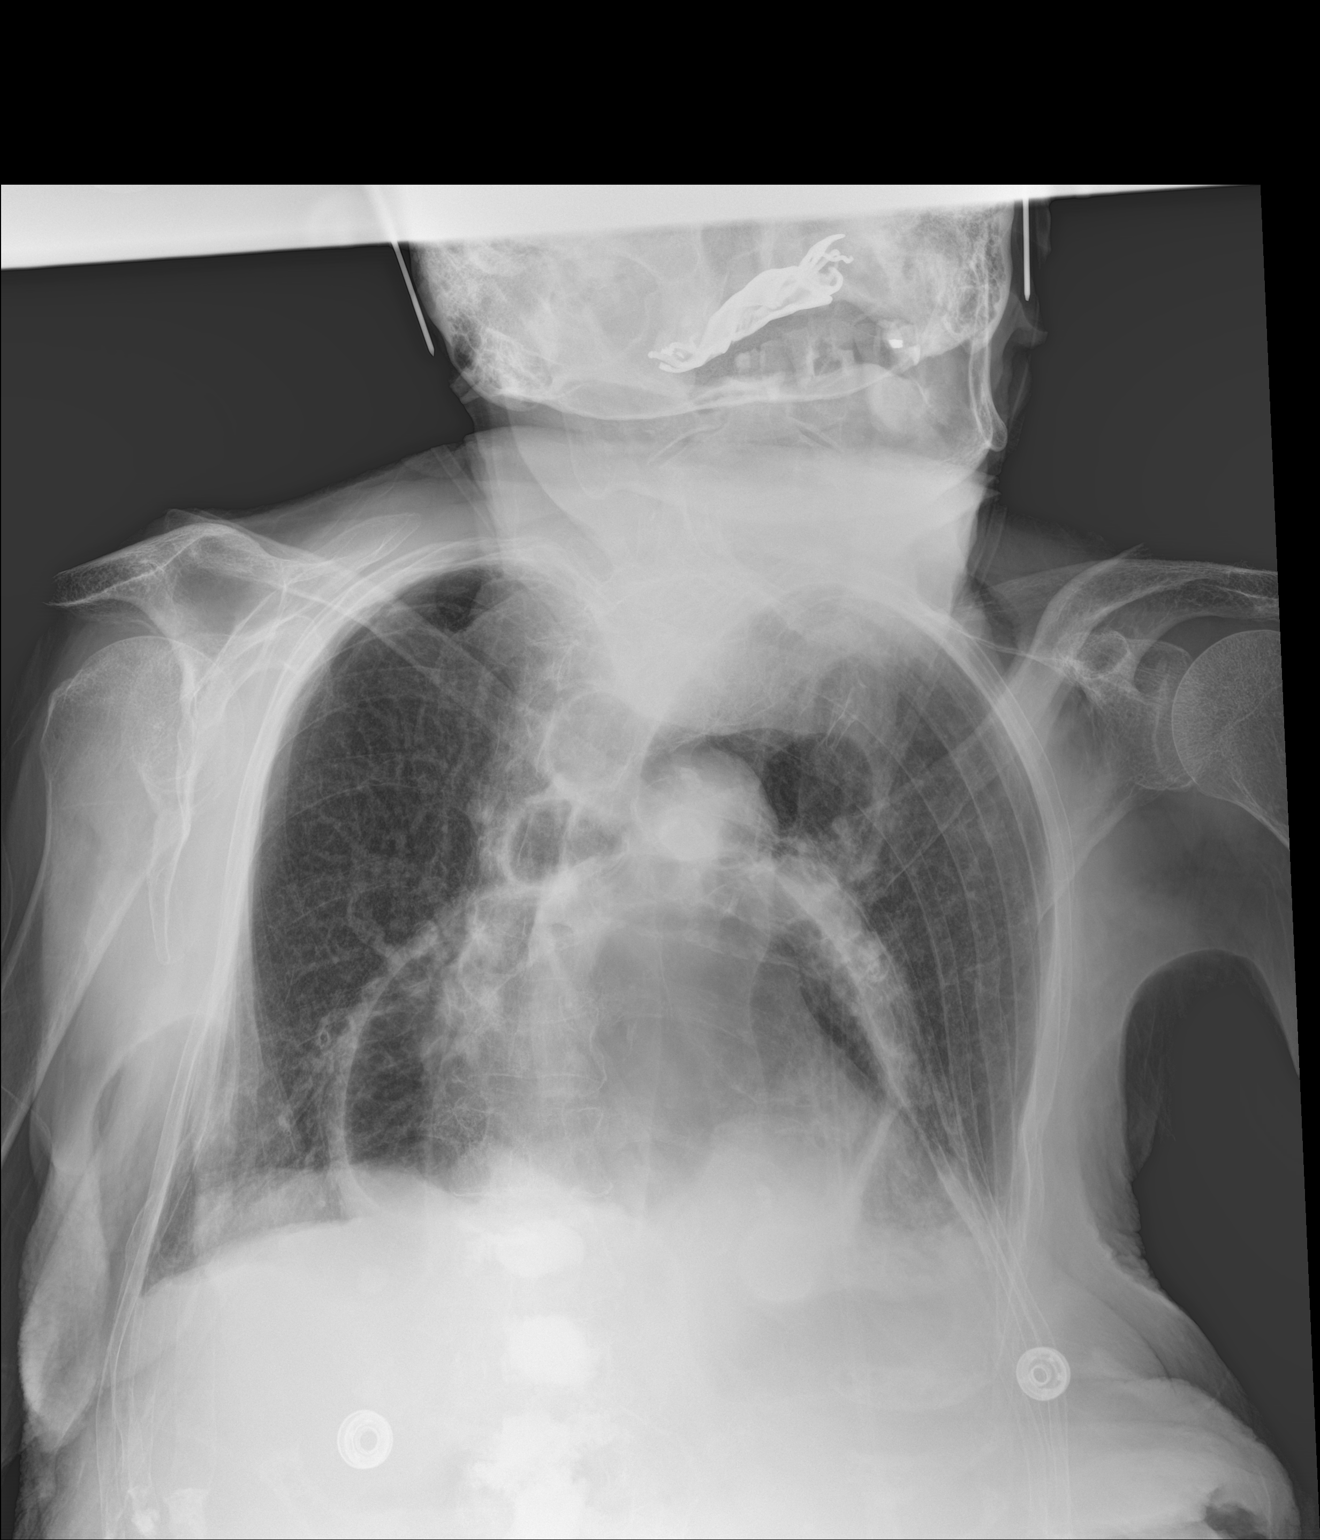

[1 of 1 positions shown; findings below may reference images not displayed]

FINDINGS: Mediastinum and hilar structures normal. Heart size normal. Very
large hiatal hernia again noted. Mild bibasilar atelectasis and/or
infiltrates cannot be excluded. No pleural effusion or pneumothorax.
Diffuse osteopenia.
IMPRESSION: 1. Large sliding hiatal hernia again noted.
2. Mild bibasilar subsegmental atelectasis and/or infiltrates .

## 2016-11-25 IMAGING — CR DG CHEST 1V PORT
1 series · 1 of 1 positions shown · non-contrast
Comparison: May 07, 2015

CLINICAL DATA: Gradually worsening respiratory distress for 24
hours

EXAM:
PORTABLE CHEST 1 VIEW

[ap portable]
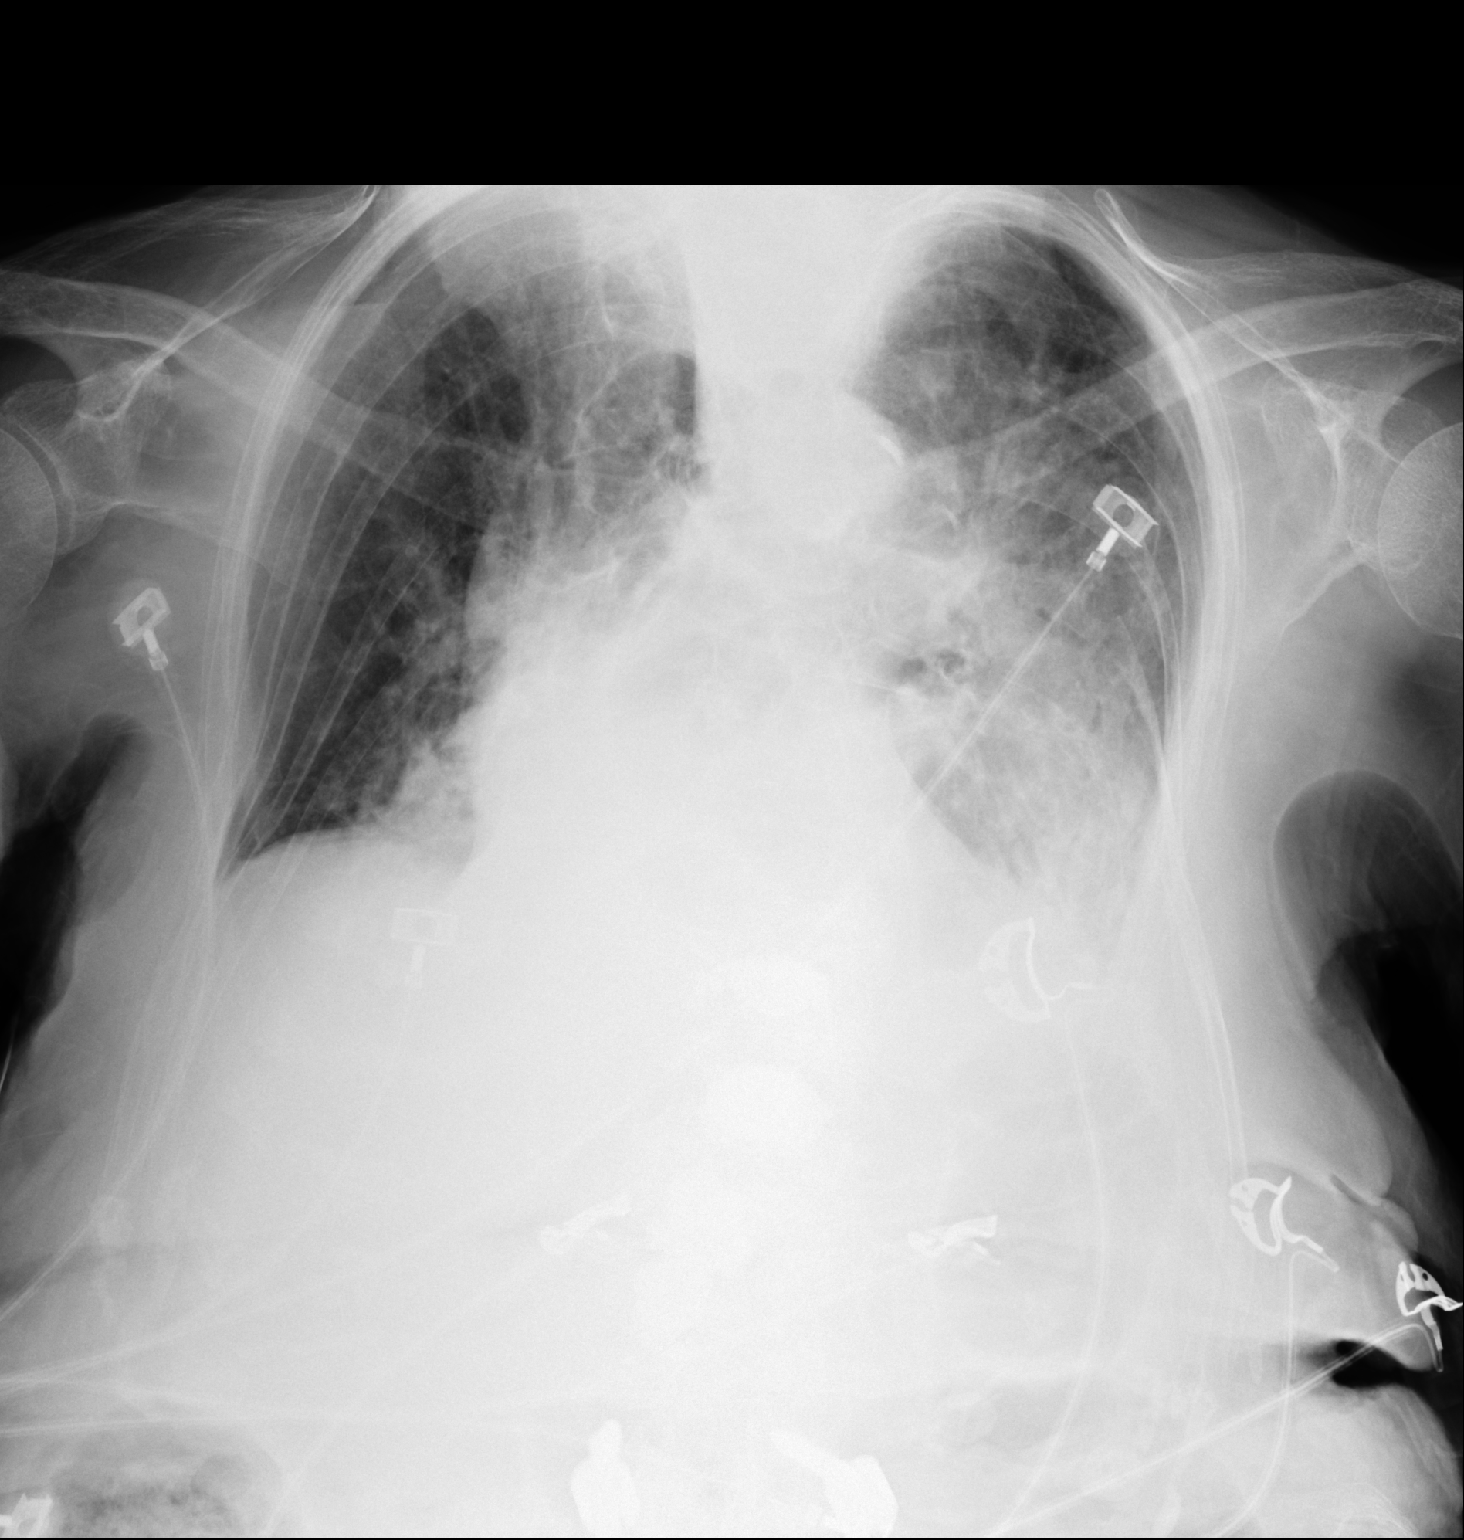

[1 of 1 positions shown; findings below may reference images not displayed]

FINDINGS: The heart size is enlarged. The mediastinal contour is stable. There
is consolidation of the left mid and lung base. There is left
pleural effusion. The right lung is clear. The bony structures are
stable.
IMPRESSION: Pneumonia of the left mid and lung base with left pleural effusion.
